# Patient Record
Sex: Female | Born: 1935 | Race: White | Hispanic: No | Smoking: Former smoker
Health system: Southern US, Community
[De-identification: ages and names within clinical notes are randomized; demographics above are authoritative.]

## PROBLEM LIST (undated history)

## (undated) DIAGNOSIS — R590 Localized enlarged lymph nodes: Secondary | ICD-10-CM

## (undated) DIAGNOSIS — C3490 Malignant neoplasm of unspecified part of unspecified bronchus or lung: Secondary | ICD-10-CM

## (undated) DIAGNOSIS — Z720 Tobacco use: Secondary | ICD-10-CM

## (undated) DIAGNOSIS — J45909 Unspecified asthma, uncomplicated: Secondary | ICD-10-CM

## (undated) DIAGNOSIS — Z8739 Personal history of other diseases of the musculoskeletal system and connective tissue: Secondary | ICD-10-CM

## (undated) DIAGNOSIS — M8580 Other specified disorders of bone density and structure, unspecified site: Secondary | ICD-10-CM

## (undated) DIAGNOSIS — I10 Essential (primary) hypertension: Secondary | ICD-10-CM

## (undated) DIAGNOSIS — R040 Epistaxis: Secondary | ICD-10-CM

## (undated) DIAGNOSIS — Z87898 Personal history of other specified conditions: Secondary | ICD-10-CM

## (undated) DIAGNOSIS — J449 Chronic obstructive pulmonary disease, unspecified: Secondary | ICD-10-CM

## (undated) DIAGNOSIS — T7840XA Allergy, unspecified, initial encounter: Secondary | ICD-10-CM

## (undated) DIAGNOSIS — C7931 Secondary malignant neoplasm of brain: Secondary | ICD-10-CM

## (undated) DIAGNOSIS — H409 Unspecified glaucoma: Secondary | ICD-10-CM

## (undated) DIAGNOSIS — C801 Malignant (primary) neoplasm, unspecified: Secondary | ICD-10-CM

## (undated) DIAGNOSIS — K449 Diaphragmatic hernia without obstruction or gangrene: Secondary | ICD-10-CM

## (undated) DIAGNOSIS — G459 Transient cerebral ischemic attack, unspecified: Secondary | ICD-10-CM

## (undated) DIAGNOSIS — R42 Dizziness and giddiness: Secondary | ICD-10-CM

## (undated) DIAGNOSIS — H8110 Benign paroxysmal vertigo, unspecified ear: Secondary | ICD-10-CM

## (undated) DIAGNOSIS — M109 Gout, unspecified: Secondary | ICD-10-CM

## (undated) DIAGNOSIS — M154 Erosive (osteo)arthritis: Secondary | ICD-10-CM

## (undated) DIAGNOSIS — K5790 Diverticulosis of intestine, part unspecified, without perforation or abscess without bleeding: Secondary | ICD-10-CM

## (undated) DIAGNOSIS — K649 Unspecified hemorrhoids: Secondary | ICD-10-CM

## (undated) DIAGNOSIS — J40 Bronchitis, not specified as acute or chronic: Secondary | ICD-10-CM

## (undated) DIAGNOSIS — Z923 Personal history of irradiation: Secondary | ICD-10-CM

## (undated) DIAGNOSIS — M47812 Spondylosis without myelopathy or radiculopathy, cervical region: Secondary | ICD-10-CM

## (undated) DIAGNOSIS — E785 Hyperlipidemia, unspecified: Secondary | ICD-10-CM

## (undated) DIAGNOSIS — F40240 Claustrophobia: Secondary | ICD-10-CM

## (undated) DIAGNOSIS — N281 Cyst of kidney, acquired: Secondary | ICD-10-CM

## (undated) DIAGNOSIS — IMO0001 Reserved for inherently not codable concepts without codable children: Secondary | ICD-10-CM

## (undated) HISTORY — DX: Other specified disorders of bone density and structure, unspecified site: M85.80

## (undated) HISTORY — DX: Hyperlipidemia, unspecified: E78.5

## (undated) HISTORY — PX: HEMORRHOID SURGERY: SHX153

## (undated) HISTORY — DX: Personal history of other diseases of the musculoskeletal system and connective tissue: Z87.39

## (undated) HISTORY — DX: Erosive (osteo)arthritis: M15.4

## (undated) HISTORY — DX: Benign paroxysmal vertigo, unspecified ear: H81.10

## (undated) HISTORY — DX: Unspecified hemorrhoids: K64.9

## (undated) HISTORY — PX: COLONOSCOPY: SHX174

## (undated) HISTORY — DX: Gout, unspecified: M10.9

## (undated) HISTORY — DX: Unspecified asthma, uncomplicated: J45.909

## (undated) HISTORY — PX: TONSILLECTOMY: SUR1361

## (undated) HISTORY — DX: Personal history of other specified conditions: Z87.898

## (undated) HISTORY — PX: ABDOMINAL HYSTERECTOMY: SHX81

## (undated) HISTORY — DX: Cyst of kidney, acquired: N28.1

## (undated) HISTORY — DX: Tobacco use: Z72.0

## (undated) HISTORY — DX: Spondylosis without myelopathy or radiculopathy, cervical region: M47.812

## (undated) HISTORY — PX: APPENDECTOMY: SHX54

## (undated) HISTORY — DX: Chronic obstructive pulmonary disease, unspecified: J44.9

## (undated) HISTORY — DX: Diverticulosis of intestine, part unspecified, without perforation or abscess without bleeding: K57.90

## (undated) HISTORY — DX: Essential (primary) hypertension: I10

## (undated) HISTORY — DX: Reserved for inherently not codable concepts without codable children: IMO0001

## (undated) HISTORY — PX: EYE SURGERY: SHX253

## (undated) HISTORY — DX: Diaphragmatic hernia without obstruction or gangrene: K44.9

## (undated) HISTORY — DX: Localized enlarged lymph nodes: R59.0

## (undated) HISTORY — DX: Unspecified glaucoma: H40.9

## (undated) HISTORY — PX: OTHER SURGICAL HISTORY: SHX169

---

## 1998-06-26 ENCOUNTER — Other Ambulatory Visit: Admission: RE | Admit: 1998-06-26 | Discharge: 1998-06-26 | Payer: Self-pay | Admitting: Internal Medicine

## 1999-05-17 ENCOUNTER — Encounter: Payer: Self-pay | Admitting: Emergency Medicine

## 1999-05-17 ENCOUNTER — Emergency Department (HOSPITAL_COMMUNITY): Admission: EM | Admit: 1999-05-17 | Discharge: 1999-05-17 | Payer: Self-pay | Admitting: Emergency Medicine

## 1999-05-20 ENCOUNTER — Encounter: Payer: Self-pay | Admitting: Neurology

## 1999-05-20 ENCOUNTER — Inpatient Hospital Stay (HOSPITAL_COMMUNITY): Admission: EM | Admit: 1999-05-20 | Discharge: 1999-05-22 | Payer: Self-pay | Admitting: *Deleted

## 1999-12-17 ENCOUNTER — Ambulatory Visit (HOSPITAL_COMMUNITY): Admission: RE | Admit: 1999-12-17 | Discharge: 1999-12-17 | Payer: Self-pay | Admitting: Neurology

## 1999-12-17 ENCOUNTER — Encounter: Payer: Self-pay | Admitting: Neurology

## 2000-02-03 ENCOUNTER — Ambulatory Visit (HOSPITAL_COMMUNITY): Admission: RE | Admit: 2000-02-03 | Discharge: 2000-02-03 | Payer: Self-pay | Admitting: Orthopedic Surgery

## 2000-02-25 ENCOUNTER — Ambulatory Visit (HOSPITAL_BASED_OUTPATIENT_CLINIC_OR_DEPARTMENT_OTHER): Admission: RE | Admit: 2000-02-25 | Discharge: 2000-02-25 | Payer: Self-pay | Admitting: Orthopedic Surgery

## 2000-03-02 ENCOUNTER — Ambulatory Visit (HOSPITAL_COMMUNITY): Admission: RE | Admit: 2000-03-02 | Discharge: 2000-03-02 | Payer: Self-pay | Admitting: Orthopedic Surgery

## 2000-04-08 ENCOUNTER — Encounter: Admission: RE | Admit: 2000-04-08 | Discharge: 2000-04-28 | Payer: Self-pay | Admitting: Orthopedic Surgery

## 2000-08-19 ENCOUNTER — Other Ambulatory Visit: Admission: RE | Admit: 2000-08-19 | Discharge: 2000-08-19 | Payer: Self-pay | Admitting: Internal Medicine

## 2001-05-13 ENCOUNTER — Ambulatory Visit (HOSPITAL_COMMUNITY): Admission: RE | Admit: 2001-05-13 | Discharge: 2001-05-13 | Payer: Self-pay | Admitting: *Deleted

## 2001-05-27 ENCOUNTER — Ambulatory Visit (HOSPITAL_COMMUNITY): Admission: RE | Admit: 2001-05-27 | Discharge: 2001-05-27 | Payer: Self-pay | Admitting: *Deleted

## 2006-09-28 ENCOUNTER — Ambulatory Visit (HOSPITAL_BASED_OUTPATIENT_CLINIC_OR_DEPARTMENT_OTHER): Admission: RE | Admit: 2006-09-28 | Discharge: 2006-09-28 | Payer: Self-pay | Admitting: Urology

## 2006-11-16 ENCOUNTER — Ambulatory Visit (HOSPITAL_BASED_OUTPATIENT_CLINIC_OR_DEPARTMENT_OTHER): Admission: RE | Admit: 2006-11-16 | Discharge: 2006-11-16 | Payer: Self-pay | Admitting: Urology

## 2008-07-19 ENCOUNTER — Encounter: Admission: RE | Admit: 2008-07-19 | Discharge: 2008-10-17 | Payer: Self-pay | Admitting: Internal Medicine

## 2008-11-07 ENCOUNTER — Encounter: Admission: RE | Admit: 2008-11-07 | Discharge: 2008-11-07 | Payer: Self-pay | Admitting: Family Medicine

## 2008-11-11 ENCOUNTER — Emergency Department (HOSPITAL_COMMUNITY): Admission: EM | Admit: 2008-11-11 | Discharge: 2008-11-12 | Payer: Self-pay | Admitting: Emergency Medicine

## 2008-11-12 ENCOUNTER — Emergency Department (HOSPITAL_COMMUNITY): Admission: EM | Admit: 2008-11-12 | Discharge: 2008-11-12 | Payer: Self-pay | Admitting: Emergency Medicine

## 2009-01-03 ENCOUNTER — Encounter (INDEPENDENT_AMBULATORY_CARE_PROVIDER_SITE_OTHER): Payer: Self-pay | Admitting: *Deleted

## 2009-09-10 ENCOUNTER — Telehealth: Payer: Self-pay | Admitting: Gastroenterology

## 2010-05-20 NOTE — Progress Notes (Signed)
Summary: Schedule Colonoscopy  Phone Note Outgoing Call   Call placed by: Lamona Curl CMA Duncan Dull),  Sep 10, 2009 4:44 PM Call placed to: Patient Summary of Call: Patient is due for recall colonoscopy. Her last colonoscopy in 2003 showed diverticulosis. Patient wishes to call back and schedule at a later time because she is "in the midst of getting a new primary care doctor and I would like to talk to them first." Patient encouraged to call back to schedule procedure as soon as possible. Initial call taken by: Lamona Curl CMA Duncan Dull),  Sep 10, 2009 4:47 PM

## 2010-06-30 ENCOUNTER — Other Ambulatory Visit: Payer: Self-pay | Admitting: Orthopedic Surgery

## 2010-06-30 ENCOUNTER — Encounter (HOSPITAL_COMMUNITY): Payer: Medicare Other

## 2010-06-30 LAB — PROTIME-INR
INR: 0.96 (ref 0.00–1.49)
Prothrombin Time: 13 seconds (ref 11.6–15.2)

## 2010-06-30 LAB — URINALYSIS, ROUTINE W REFLEX MICROSCOPIC
Bilirubin Urine: NEGATIVE
Glucose, UA: NEGATIVE mg/dL
Hgb urine dipstick: NEGATIVE
Ketones, ur: NEGATIVE mg/dL
Nitrite: NEGATIVE
Protein, ur: NEGATIVE mg/dL
Specific Gravity, Urine: 1.02 (ref 1.005–1.030)
Urobilinogen, UA: 0.2 mg/dL (ref 0.0–1.0)
pH: 5 (ref 5.0–8.0)

## 2010-06-30 LAB — CBC
HCT: 42.3 % (ref 36.0–46.0)
Hemoglobin: 13.9 g/dL (ref 12.0–15.0)
MCH: 31.2 pg (ref 26.0–34.0)
MCHC: 32.9 g/dL (ref 30.0–36.0)
MCV: 94.8 fL (ref 78.0–100.0)
Platelets: 255 10*3/uL (ref 150–400)
RBC: 4.46 MIL/uL (ref 3.87–5.11)
RDW: 12.9 % (ref 11.5–15.5)
WBC: 7.4 10*3/uL (ref 4.0–10.5)

## 2010-06-30 LAB — APTT: aPTT: 43 seconds — ABNORMAL HIGH (ref 24–37)

## 2010-06-30 LAB — COMPREHENSIVE METABOLIC PANEL
ALT: 18 U/L (ref 0–35)
AST: 26 U/L (ref 0–37)
Albumin: 3.9 g/dL (ref 3.5–5.2)
Alkaline Phosphatase: 77 U/L (ref 39–117)
BUN: 15 mg/dL (ref 6–23)
CO2: 27 mEq/L (ref 19–32)
Calcium: 9.4 mg/dL (ref 8.4–10.5)
Chloride: 104 mEq/L (ref 96–112)
Creatinine, Ser: 0.81 mg/dL (ref 0.4–1.2)
GFR calc Af Amer: >60 mL/min (ref >60)
GFR calc non Af Amer: >60 mL/min (ref >60)
Glucose, Bld: 97 mg/dL (ref 70–99)
Potassium: 4.3 mEq/L (ref 3.5–5.1)
Sodium: 139 mEq/L (ref 135–145)
Total Bilirubin: 0.7 mg/dL (ref 0.3–1.2)
Total Protein: 7.4 g/dL (ref 6.0–8.3)

## 2010-06-30 LAB — URINE MICROSCOPIC-ADD ON

## 2010-06-30 LAB — SURGICAL PCR SCREEN: MRSA, PCR: NEGATIVE

## 2010-07-07 ENCOUNTER — Inpatient Hospital Stay (HOSPITAL_COMMUNITY)
Admission: RE | Admit: 2010-07-07 | Discharge: 2010-07-10 | DRG: 470 | Disposition: A | Discharge status: Skilled Nursing Facility | Payer: Medicare Other | Source: Ambulatory Visit | Attending: Orthopedic Surgery | Admitting: Orthopedic Surgery

## 2010-07-07 DIAGNOSIS — R32 Unspecified urinary incontinence: Secondary | ICD-10-CM | POA: Diagnosis present

## 2010-07-07 DIAGNOSIS — E785 Hyperlipidemia, unspecified: Secondary | ICD-10-CM | POA: Diagnosis present

## 2010-07-07 DIAGNOSIS — Z01818 Encounter for other preprocedural examination: Secondary | ICD-10-CM

## 2010-07-07 DIAGNOSIS — Z8673 Personal history of transient ischemic attack (TIA), and cerebral infarction without residual deficits: Secondary | ICD-10-CM

## 2010-07-07 DIAGNOSIS — M1710 Unilateral primary osteoarthritis, unspecified knee: Principal | ICD-10-CM | POA: Diagnosis present

## 2010-07-07 DIAGNOSIS — I10 Essential (primary) hypertension: Secondary | ICD-10-CM | POA: Diagnosis present

## 2010-07-07 DIAGNOSIS — D62 Acute posthemorrhagic anemia: Secondary | ICD-10-CM | POA: Diagnosis not present

## 2010-07-07 DIAGNOSIS — K449 Diaphragmatic hernia without obstruction or gangrene: Secondary | ICD-10-CM | POA: Diagnosis present

## 2010-07-07 DIAGNOSIS — M899 Disorder of bone, unspecified: Secondary | ICD-10-CM | POA: Diagnosis present

## 2010-07-07 DIAGNOSIS — Z01812 Encounter for preprocedural laboratory examination: Secondary | ICD-10-CM

## 2010-07-07 LAB — TYPE AND SCREEN
ABO/RH(D): O NEG
Antibody Screen: NEGATIVE

## 2010-07-07 LAB — ABO/RH: ABO/RH(D): O NEG

## 2010-07-08 LAB — CBC
Hemoglobin: 11.8 g/dL — ABNORMAL LOW (ref 12.0–15.0)
MCH: 30.3 pg (ref 26.0–34.0)
MCHC: 31.5 g/dL (ref 30.0–36.0)
Platelets: 207 10*3/uL (ref 150–400)
RDW: 13.2 % (ref 11.5–15.5)

## 2010-07-08 LAB — BASIC METABOLIC PANEL
Calcium: 8.5 mg/dL (ref 8.4–10.5)
Creatinine, Ser: 0.95 mg/dL (ref 0.4–1.2)
GFR calc Af Amer: >60 mL/min (ref >60)
GFR calc non Af Amer: 57 mL/min — ABNORMAL LOW (ref >60)
Sodium: 139 mEq/L (ref 135–145)

## 2010-07-09 LAB — BASIC METABOLIC PANEL
Calcium: 8.3 mg/dL — ABNORMAL LOW (ref 8.4–10.5)
GFR calc Af Amer: >60 mL/min (ref >60)
GFR calc non Af Amer: >60 mL/min (ref >60)
Sodium: 138 mEq/L (ref 135–145)

## 2010-07-09 LAB — CBC
Hemoglobin: 10.1 g/dL — ABNORMAL LOW (ref 12.0–15.0)
Platelets: 172 10*3/uL (ref 150–400)
RBC: 3.33 MIL/uL — ABNORMAL LOW (ref 3.87–5.11)

## 2010-07-09 NOTE — Op Note (Signed)
[name], [name]                  ACCOUNT NO.:  [id number]  MEDICAL RECORD NO.:  [id number]           PATIENT TYPE:  I  LOCATION:  0009                         FACILITY:  [facility]  PHYSICIAN:  Ollen Gross, M.D.    DATE OF BIRTH:  Jul 18, 1928  DATE OF PROCEDURE:  07/07/2010 DATE OF DISCHARGE:                              OPERATIVE REPORT   PREOPERATIVE DIAGNOSIS:  Osteoarthritis, left knee.  POSTOPERATIVE DIAGNOSIS:  Osteoarthritis, left knee.  PROCEDURE:  Left total knee arthroplasty.  SURGEON:  Ollen Gross, MD  ASSISTANT:  Rozell Searing, PA-C  ANESTHESIA:  Spinal.  ESTIMATED BLOOD LOSS:  Minimal.  DRAIN:  Hemovac x1.  TOURNIQUET TIME:  34 minutes at 300 mmHg.  COMPLICATIONS:  None.  CONDITION:  Stable to Recovery.  BRIEF CLINICAL NOTE:  [name] is a 77 year old female with advanced end-stage arthritis of the left knee with progressively worsening pain and dysfunction.  She has failed nonoperative management and presents for total knee arthroplasty.  PROCEDURE IN DETAIL:  After successful initiation of spinal anesthetic, a tourniquet was placed high on the left thigh, and left lower extremity prepped and draped in the usual sterile fashion.  Extremity was wrapped in an Esmarch, knee flexed, tourniquet inflated to 300 mmHg.  Midline incision was made with a 10 blade through subcutaneous tissue at the level of the extensor mechanism.  Fresh blade was used to make a medial parapatellar arthrotomy.  Soft tissue on the proximal medial tibia subperiosteally elevated to the joint line with a knife and into the semimembranosus bursa with a Cobb elevator.  Soft tissue laterally was elevated with attention being paid to avoid any patellar tendon on tibial tubercle.  Patella was everted, knee flexed 90 degrees, and ACL and PCL removed.  Drill was used to create a starting hole in the distal femur and the canal was thoroughly irrigated.  The 5-degree left valgus alignment  guide was placed and the distal femoral cutting block was pinned to remove 11 mm off the distal femur.  Distal femoral resection was made with an oscillating saw.  Tibia subluxed forward and the menisci were removed.  The extramedullary tibial alignment guide was placed referencing proximally at the medial aspect of the tibial tubercle and distally along the 2nd metatarsal axis and tibial crest.  The block was pinned to remove 2 mm off the more deficient medial side.  Tibial resection was made with an oscillating saw.  Size 2.5 was the most appropriate tibial component and the proximal tibia was prepared with a modular drill, and keel punched for the size 2.5.  Femoral sizing guide was placed, size 3 was most appropriate.  Rotation was marked at the epicondylar axis and confirmed by creating a rectangular flexion gap at 90 degrees.  Size 3 cutting block was placed in this rotation and the anterior and posterior chamfer cuts were made. Intercondylar block was placed and that cut was made.  Trial size 3 posterior stabilized femur was placed.  A 12.5-mm posterior stabilized rotating platform insert trial was placed.  With the 12.5, full extension was achieved with excellent varus-valgus and  anterior- posterior balance, throughout full range of motion.  The patella was then everted and thickness measured to be 22 mm.  Freehand resection was taken to 13 mm, 35 template was placed, lug holes were drilled, trial patella was placed and it tracked normally.  Osteophytes were removed off the posterior femur with the trial in place.  All trials were removed and the cut bone surfaces were prepared with pulsatile lavage. Cement was mixed and once ready for implantation, the size 2.5 mobile- bearing tibial tray, size 3 posterior stabilized femur, and the 35 patella were cemented into place and patella was held with a clamp. Trial 12.5-mm insert was placed, knee held in full extension, all extruded  cement removed.  When the cement was fully hardened, then the permanent 12.5 mm posterior stabilized rotating platform insert was placed in the tibial tray.  The wound was copiously irrigated with saline solution and the arthrotomy closed over Hemovac drain with interrupted #1 PDS.  Flexion against gravity was about 135 degrees and patella tracked normally.  The tourniquet was then released at total time of 34 minutes.  Subcutaneous was closed with interrupted 2-0 Vicryl and subcuticular with running 4-0 Monocryl.  Catheter for the Marcaine pain pump was placed and the pump was initiated.  Incision was cleaned and dried and Steri-Strips and a bulky sterile dressing were applied. She was then placed into a knee immobilizer, awakened, and transported to Recovery in stable condition.     Ollen Gross, M.D.     FA/MEDQ  D:  07/07/2010  T:  07/07/2010  Job:  [id number]  Electronically Signed by Ollen Gross M.D. on 07/09/2010 08:20:34 AM

## 2010-07-10 LAB — CBC
Hemoglobin: 9.3 g/dL — ABNORMAL LOW (ref 12.0–15.0)
RBC: 3.01 MIL/uL — ABNORMAL LOW (ref 3.87–5.11)
WBC: 10.1 10*3/uL (ref 4.0–10.5)

## 2010-07-14 NOTE — Discharge Summary (Signed)
NAMEJENNI, [name]                  ACCOUNT NO.:  [id number]  MEDICAL RECORD NO.:  [id number]           PATIENT TYPE:  I  LOCATION:  1608                         FACILITY:  [facility]  PHYSICIAN:  Ollen Gross, M.D.    DATE OF BIRTH:  1928-04-29  DATE OF ADMISSION:  07/07/2010 DATE OF DISCHARGE:  07/10/2010                        DISCHARGE SUMMARY - REFERRING   ADMITTING DIAGNOSES: 1. Osteoarthritis, left knee. 2. Questionable transient ischemic attack in 2001. 3. Vertigo. 4. Cataracts. 5. Glaucoma. 6. Childhood asthma. 7. Hypertension. 8. Hyperlipidemia. 9. Aortic sclerosis. 10.History of gastrointestinal ulcers. 11.Hiatal hernia. 12.Hemorrhoids. 13.History of diverticulosis. 14.Urinary incontinence. 15.Osteopenia.  DISCHARGE DIAGNOSES: 1. Osteoarthritis, left knee, status post left total knee replacement     arthroplasty. 2. Postoperative acute blood loss anemia, did not require transfusion. 3. Questionable transient ischemic attack in 2001. 4. Vertigo. 5. Cataracts. 6. Glaucoma. 7. Childhood asthma. 8. Hypertension. 9. Hyperlipidemia. 10.Aortic sclerosis. 11.History of gastrointestinal ulcers. 12.Hiatal hernia. 13.Hemorrhoids. 14.History of diverticulosis. 15.Urinary incontinence. 16.Osteopenia.  PROCEDURE:  July 07, 2010, left total knee.  SURGEON:  Ollen Gross, MD  ASSISTANT:  [name], [facility]  ANESTHESIA:  Spinal anesthesia.  TOURNIQUET TIME:  34 minutes.  CONSULTS:  None.  BRIEF HISTORY:  The patient is a 77 year old female with osteoarthritis of the left knee with progressive worsening pain and dysfunction.  She has failed nonoperative management and now presents for total knee arthroplasty.  LABORATORY DATA:  CBC on admission showed hemoglobin 13.9, hematocrit of 42.3, white cell count 7.4, platelets of 255,000.  PT/INR 13.0 and 0.96 with a PTT of 43.  Chem panel on admission all within normal limits. Preop UA, small leukocytes, few  squamous, 3 to 6 white cells, few bacteria.  Blood group type, O negative.  Nasal swabs were negative. Staph aureus negative for MRSA.  Serial CBCs were followed.  Hemoglobin dropped down to 11.8 and 10.1, last noted H and H was 9.3 and 28.9.  At time of discharge, white count remained within normal limits.  Serial BMETs were followed for 48 hours.  Electrolytes remained within normal limits.  Glucose did go up from 97 to 142, back down to 133 noted on postop day #2.  HOSPITAL COURSE:  The patient was admitted to [facility], taken to OR, underwent above-stated procedure without complication.  The patient tolerated procedure well, later transferred to the recovery room and then to orthopedic floor for postoperative care.  She was given 24 hours postop IV antibiotics.  She is placed on p.o. and IV analgesics pain control following surgery.  She was given Xarelto protocol for DVT prophylaxis.  She was allowed to be weightbearing as tolerated.  She did want to look into skilled nursing facility postoperatively due to hersituation and we got social work involved immediately.  She is doing pretty well in the morning of day 1 although had fair amount of pain. We encouraged taking p.o.'s with IV backup.  By day 2, pain was doing a little bit better.  She was starting get up and walk about 20 feet, slowly progressing, felt to be an excellent candidate for rehab.  Once  she was taking p.o.'s well, we discontinued the fluids and Hep-Lock the IV.  She was seen on postop day #3.  Pain was under much better control, slowly progressing with physical therapy, and it was thought the patient will be transferred over at that time.  DISCHARGE PLAN:  The patient discharged to [facility] on July 10, 2010.  DISCHARGE MEDICATIONS: 1. Xarelto 10 mg p.o. daily for 14 days, then discontinue. 2. Colace 100 mg p.o. b.i.d. 3. Bystolic 5 mg p.o. q.h.s. 4. Robaxin 500 mg p.o. q.6-8h. p.r.n. spasm. 5.  Zofran 4 mg p.o. q.6h. p.r.n. nausea. 6. Percocet 5 mg 1 or 2 every 4-6 hours as needed for moderate pain. 7. Tylenol 325, 1 or 2 every 4-6 hours as needed for mild pain,     temperature, or headache. 8. Timolol ophthalmic solution, 0.5%, left eye, 1 drop twice a day. 9. Laxative of choice. 10.Enema of choice.  Please note, the patient was on aspirin 81 mg daily preoperatively. Please resume her aspirin 81 mg once she has stopped the Xarelto.  She will be on the Xarelto for 14 days, so on postop day 15 if she is still at the nursing center, please resume aspirin 81 mg daily.  This is to start on postop day 51 once the Xarelto protocol was completed with 14 days.  FOLLOWUP:  The patient is to follow up with Dr. Lequita Halt in the office 2 weeks from the date of surgery.  Please contact the office at [phone] to arrange appointment and followup care.  ACTIVITY:  Weightbearing as tolerated, total knee protocol.  PT and OT for gait training, ambulation, ADLs, range of motion, and strengthening exercises.  DISPOSITION:  [facility]  CONDITION ON DISCHARGE:  Improving.     [name], P.A.C.   ______________________________ Ollen Gross, M.D.    ALP/MEDQ  D:  07/10/2010  T:  07/10/2010  Job:  [id number]  cc:   [name], M.D. Fax: [phone]  Skilled Nursing Facility of Choice  Electronically Signed by [name] P.A.C. on 07/11/2010 10:31:10 AM Electronically Signed by Ollen Gross M.D. on 07/14/2010 07:11:19 AM

## 2010-07-14 NOTE — H&P (Signed)
NAMEJAVONNA, [name]                  ACCOUNT NO.:  [id number]  MEDICAL RECORD NO.:  [id number]           PATIENT TYPE:  LOCATION:                                 FACILITY:  PHYSICIAN:  Ollen Gross, M.D.    DATE OF BIRTH:  [birth year]  DATE OF ADMISSION:  07/07/2010 DATE OF DISCHARGE:                             HISTORY & PHYSICAL   CHIEF COMPLAINT:  Left knee pain.  HISTORY OF PRESENT ILLNESS:  The patient is a 77 year old female who has been seen by Dr. Lequita Halt for ongoing advanced end-stage arthritis which has been refractory to conservative management.  It was felt that she would benefit from undergoing surgical intervention.  Risks and benefits have been discussed.  She would like to proceed with surgery.  She has been seen preoperatively by Dr. Kevan Ny and felt to be stable for procedure.  ALLERGIES: 1. WELLBUTRIN. 2. STATINS. 3. ADHESIVE TAPE.  Please note the patient is able to use paper tape.  FOOD ALLERGIES TO SHELLFISH.  Please note she does not have a Betadine or iodine allergy.  She is able to use Betadine.  CURRENT MEDICATIONS:  Timolol, Premarin, diltiazem, B complex, meloxicam, fish oil, Centrum Silver, aspirin.  PAST MEDICAL HISTORY: 1. Questionable TIA in 2001. 2. Vertigo. 3. Cataracts. 4. Glaucoma. 5. Childhood asthma. 6. Hypertension. 7. Hyperlipidemia. 8. Aortic sclerosis. 9. History of gastrointestinal ulcers. 10.Hiatal hernia. 11.Hemorrhoids. 12.History of diverticulosis. 13.History of urinary incontinence. 14.Osteopenia.  PAST SURGICAL HISTORY:  Appendectomy, hemorrhoidectomy, hysterectomy, cataract surgery x2, bladder procedure and colonoscopy.  FAMILY HISTORY:  Father with heart attack.  Mother with cirrhosis.  SOCIAL HISTORY:  Widowed, retired.  Past smoker, approximately 30 years. Two children.  She would like to look into rehab facility.  Postop, she does have a living will.  REVIEW OF SYSTEMS:  GENERAL:  No fevers, chills, night  sweats. NEUROLOGIC:  Little bit of blurred vision.  No seizures, syncope or paralysis.  RESPIRATORY:  No shortness breath, productive cough or hemoptysis.  CARDIOVASCULAR:  No chest pain.  No orthopnea.  GI:  No nausea, vomiting or diarrhea.  GU:  Little bit of incontinence.  No dysuria, hematuria.  MUSCULOSKELETAL:  Knee pain.  PHYSICAL EXAMINATION:  VITAL SIGNS:  Pulse 68, respirations 12, blood pressure 180/88 in the right arm, 176/87 in the left arm. GENERAL:  77 year old white female, well-nourished, well-developed, no acute distress, short-statured, alert, oriented and cooperative. HEENT:  Normocephalic, atraumatic.  Pupils are round and reactive.  EOMs intact.  Does not wear glasses.  Does have upper partial denture plate. NECK:  Supple. CHEST:  Clear. HEART:  Regular rate and rhythm without murmur, S1 and S2. ABDOMEN:  Soft, nontender.  Bowel sounds present. RECTAL, BREASTS AND GENITALIA:  Not done, not part of present illness. EXTREMITIES:  Left knee, no effusion, marked crepitus or varus deformity.  Range of motion is 5 to 110.  No instability.  IMPRESSION:  Osteoarthritis, left knee.  PLAN:  The patient was admitted to [facility] to undergo a left total knee replacement arthroplasty.  Surgery will be performed by Dr. Ollen Gross.  [name], P.A.C.   ______________________________ Ollen Gross, M.D.    ALP/MEDQ  D:  07/05/2010  T:  07/05/2010  Job:  [id number]  cc:   Candyce Churn, M.D. Fax: [phone]  Electronically Signed by Patrica Duel P.A.C. on 07/11/2010 10:31:02 AM Electronically Signed by Ollen Gross M.D. on 07/14/2010 07:11:16 AM

## 2010-07-27 LAB — DIFFERENTIAL
Eosinophils Relative: 3 % (ref 0–5)
Lymphocytes Relative: 29 % (ref 12–46)
Lymphs Abs: 1.5 10*3/uL (ref 0.7–4.0)
Monocytes Absolute: 0.5 10*3/uL (ref 0.1–1.0)
Neutro Abs: 3.1 10*3/uL (ref 1.7–7.7)

## 2010-07-27 LAB — CBC
HCT: 38.5 % (ref 36.0–46.0)
Hemoglobin: 12.6 g/dL (ref 12.0–15.0)
Platelets: 241 10*3/uL (ref 150–400)
WBC: 5.2 10*3/uL (ref 4.0–10.5)

## 2010-07-27 LAB — PROTIME-INR
INR: 0.9 (ref 0.00–1.49)
Prothrombin Time: 12.5 seconds (ref 11.6–15.2)
Prothrombin Time: 13.4 seconds (ref 11.6–15.2)

## 2010-07-27 LAB — POCT I-STAT, CHEM 8
Chloride: 105 mEq/L (ref 96–112)
HCT: 38 % (ref 36.0–46.0)
Hemoglobin: 12.9 g/dL (ref 12.0–15.0)
Potassium: 4 mEq/L (ref 3.5–5.1)

## 2010-07-27 LAB — APTT: aPTT: 36 seconds (ref 24–37)

## 2010-09-02 NOTE — Op Note (Signed)
NAMEMECCA, [name]                  ACCOUNT NO.:  [id number]   MEDICAL RECORD NO.:  [id number]          PATIENT TYPE:  AMB   LOCATION:  [facility]                         FACILITY:  [facility]   PHYSICIAN:  Martina Sinner, MD DATE OF BIRTH:  1928-10-18   DATE OF PROCEDURE:  09/28/2006  DATE OF DISCHARGE:                               OPERATIVE REPORT   PREOPERATIVE DIAGNOSIS:  Stress incontinence.   POSTOPERATIVE DIAGNOSIS:  Stress incontinence.   PROCEDURE:  Cystoscopy, transurethral collagen injection therapy.   [name] has complicated incontinence.  She leaks without warning.  She  has failed medical therapy.   The patient was prepped and draped in usual fashion.  I used the ACMI  scope for the examination.  The bladder mucosa and trigone were normal.  There was no stitch, foreign body, or carcinoma.  I injected 3 syringes  of collagen at 5, 7, and 1 o'clock.  The first injection at 5 o'clock  was a little bit deep, and then there was little bit of extravasation  trying to inject at 3 o'clock.  I then injected at 7 and 1 o'clock, and  there was excellent coaptation.  A total of 3 syringes of collagen were  used.  I will carefully monitor her success of this therapy, recognizing  the complicated nature of her incontinence.           ______________________________  Martina Sinner, MD  Electronically Signed     SAM/MEDQ  D:  09/28/2006  T:  09/28/2006  Job:  [id number]

## 2010-09-02 NOTE — Op Note (Signed)
[name], EWALD                  ACCOUNT NO.:  [id number]   MEDICAL RECORD NO.:  [id number]          PATIENT TYPE:  AMB   LOCATION:  NESC                         FACILITY:  [facility]   PHYSICIAN:  Martina Sinner, MD DATE OF BIRTH:  05-Oct-1928   DATE OF PROCEDURE:  DATE OF DISCHARGE:                               OPERATIVE REPORT   PREOPERATIVE DIAGNOSES:  Stress incontinence.   POSTOPERATIVE DIAGNOSES:  Stress incontinence.   SURGERY:  Cystoscopy, transurethral collagen injection therapy.   [name] has stress incontinence and leakage without warning.  I have  been trying to sort out whether or not her symptoms are due to bladder  overactivity that she is not feeling or due to an outlet abnormality.  She had transient benefit from one collagen treatment.  She was here for  another injection.   The patient was prepped and draped in the usual fashion.  I used the  ACMI collagen scope for the examination.  Bladder mucosa and trigone  were normal.  I could visualize some residual collagen.   I injected four syringes of collagen at 5, 7 and 6 o'clock.  I was  actually very happy with the coaptation.   The bladder was emptied with a red small red rubber catheter.   [name] will be called and seen in a week in clinic.  If she does not  respond to collage, I am hopefully going to be able to recommend PTNS.           ______________________________  Martina Sinner, MD  Electronically Signed     SAM/MEDQ  D:  11/16/2006  T:  11/16/2006  Job:  [id number]

## 2011-02-02 LAB — POCT HEMOGLOBIN-HEMACUE
Hemoglobin: 13.3
Operator id: 268271

## 2011-11-17 ENCOUNTER — Other Ambulatory Visit: Payer: Self-pay | Admitting: Internal Medicine

## 2011-11-17 ENCOUNTER — Ambulatory Visit
Admission: RE | Admit: 2011-11-17 | Discharge: 2011-11-17 | Disposition: A | Discharge status: Home / Self Care | Payer: Medicare Other | Source: Ambulatory Visit | Attending: Internal Medicine | Admitting: Internal Medicine

## 2011-11-17 DIAGNOSIS — R590 Localized enlarged lymph nodes: Secondary | ICD-10-CM

## 2011-11-17 MED ORDER — IOHEXOL 300 MG/ML  SOLN
75.0000 mL | Freq: Once | INTRAMUSCULAR | Status: AC | PRN
  Administered 2011-11-17: 75 mL via INTRAVENOUS

## 2011-11-19 DIAGNOSIS — C801 Malignant (primary) neoplasm, unspecified: Secondary | ICD-10-CM

## 2011-11-19 HISTORY — DX: Malignant (primary) neoplasm, unspecified: C80.1

## 2011-11-23 DIAGNOSIS — R590 Localized enlarged lymph nodes: Secondary | ICD-10-CM | POA: Insufficient documentation

## 2011-11-24 ENCOUNTER — Institutional Professional Consult (permissible substitution) (INDEPENDENT_AMBULATORY_CARE_PROVIDER_SITE_OTHER): Payer: Medicare Other | Admitting: Thoracic Surgery (Cardiothoracic Vascular Surgery)

## 2011-11-24 ENCOUNTER — Other Ambulatory Visit: Payer: Self-pay | Admitting: Thoracic Surgery (Cardiothoracic Vascular Surgery)

## 2011-11-24 ENCOUNTER — Encounter (HOSPITAL_COMMUNITY): Payer: Self-pay | Admitting: Pharmacy Technician

## 2011-11-24 ENCOUNTER — Other Ambulatory Visit: Payer: Self-pay

## 2011-11-24 ENCOUNTER — Encounter: Payer: Self-pay | Admitting: Thoracic Surgery (Cardiothoracic Vascular Surgery)

## 2011-11-24 VITALS — BP 188/92 | HR 72 | Resp 18 | Ht 61.0 in | Wt 162.0 lb

## 2011-11-24 DIAGNOSIS — R42 Dizziness and giddiness: Secondary | ICD-10-CM

## 2011-11-24 DIAGNOSIS — D381 Neoplasm of uncertain behavior of trachea, bronchus and lung: Secondary | ICD-10-CM

## 2011-11-24 DIAGNOSIS — R222 Localized swelling, mass and lump, trunk: Secondary | ICD-10-CM

## 2011-11-24 DIAGNOSIS — R590 Localized enlarged lymph nodes: Secondary | ICD-10-CM

## 2011-11-24 DIAGNOSIS — R599 Enlarged lymph nodes, unspecified: Secondary | ICD-10-CM

## 2011-11-24 DIAGNOSIS — R918 Other nonspecific abnormal finding of lung field: Secondary | ICD-10-CM

## 2011-11-24 DIAGNOSIS — R51 Headache: Secondary | ICD-10-CM

## 2011-11-24 NOTE — Progress Notes (Signed)
PCP is [name], MD Referring Provider is Georgann Housekeeper, MD  Chief Complaint  Patient presents with  . Lung Mass    Referral from Dr Donette Larry Dr Kevan Ny for eval on Rt lung mass, mediastinal adenopathy, Chest CT 11/17/2011   . Adenopathy    HPI: 78 year old woman presents with chief complaint of enlarged lymph nodes.  [name] is an 78 year old woman who recently was vacationing in [place]. She developed some left-sided neck pain and went to the hospital. A CT scan of the neck was done and there was signs of mediastinal adenopathy noted. Her pain was treated with Vicodin and has improved. When she returned to [facility] she saw Dr. Donette Larry in had a CT of the chest done which showed a right middle lobe mass, bulky hilar or mediastinal adenopathy, and a 1.5 cm right adrenal mass. She denies cough, hemoptysis, shortness of breath or chest pain. She has been having headaches for the last couple of weeks. And over the past week has noted several dizzy spells, which come on spontaneously and last about 5 minutes. She has not had any weight loss. She denies fevers, chills or sweats.   Past Medical History  Diagnosis Date  . Cervical spondylosis   . Renal cyst     simple bilateral 2008  . Diverticulosis   . Vertigo, benign positional   . Hyperlipidemia   . Aortic sclerosis     by exam,  . Erosive osteoarthritis of hand   . History of calcium pyrophosphate deposition disease (CPPD)   . Gout     vs pseudo gout flares in past  . Mediastinal adenopathy   . Tobacco use   . COPD, mild   . Glaucoma   . Hypertension   . Osteopenia   . Hiatal hernia   . Hemorrhoids   . H/O urinary incontinence   . Childhood asthma     Past Surgical History  Procedure Date  . Cystoscopy, transurethral collagen injection therapy   . Left total knee arthroplasty   . Appendectomy   . Hemorrhoid surgery   . Cataract surgery x 2     Family History  Problem Relation Age of Onset  . Heart  disease Father   . Cirrhosis Mother     Social History History  Substance Use Topics  . Smoking status: Former Smoker -- 2.0 packs/day for 35 years    Types: Cigarettes    Quit date: 04/21/2003  . Smokeless tobacco: Never Used  . Alcohol Use: Yes     rare wine    Current Outpatient Prescriptions  Medication Sig Dispense Refill  . ascorbic acid (VITAMIN C) 500 MG tablet Take 500 mg by mouth daily.      Marland Kitchen aspirin 81 MG tablet Take 81 mg by mouth daily.      . Azelastine-Fluticasone (DYMISTA) 137-50 MCG/ACT SUSP Place into the nose daily as needed.      . Calcium-Vitamin D (CALTRATE 600 PLUS-VIT D PO) Take by mouth daily.      . colchicine 0.6 MG tablet Take 0.6 mg by mouth daily.      . diclofenac sodium (VOLTAREN) 1 % GEL Apply 1 application topically every 6 (six) hours.      Marland Kitchen estrogens, conjugated, (PREMARIN) 0.625 MG tablet Take 0.625 mg by mouth daily. Take daily for 21 days then do not take for 7 days.      . fish oil-omega-3 fatty acids 1000 MG capsule Take 2 g by mouth daily.      Marland Kitchen  hydrocortisone (ANUSOL-HC) 25 MG suppository Place 25 mg rectally 2 (two) times daily as needed.      . loteprednol (LOTEMAX) 0.2 % SUSP 1 drop 4 (four) times daily.      . Multiple Vitamin (MULTIVITAMIN) tablet Take 1 tablet by mouth daily.      . nebivolol (BYSTOLIC) 5 MG tablet Take 5 mg by mouth daily.      . timolol (TIMOPTIC-XR) 0.5 % ophthalmic gel-forming Apply 1 drop to eye daily.        Allergies  Allergen Reactions  . Shellfish Allergy Anaphylaxis and Hives    NO betadine or iodine allergy  . Septra Ds (Sulfamethoxazole-Tmp Ds) Nausea And Vomiting  . Statins Other (See Comments)    Severe joint pains  . Tape     Adhesive only. (Paper tape OK)  . Wellbutrin (Bupropion) Other (See Comments)    Shaking all over     Review of Systems  Gastrointestinal:       Ulcers  Musculoskeletal: Positive for joint swelling and arthralgias.       Left TKR, right hip pain  Neurological:  Positive for dizziness and headaches.       "ministroke"  Psychiatric/Behavioral:       Anxiety  All other systems reviewed and are negative.    BP 188/92  Pulse 72  Resp 18  Ht 5\' 1"  (1.549 m)  Wt 162 lb (73.483 kg)  BMI 30.61 kg/m2  SpO2 97% Physical Exam  Constitutional: She is oriented to person, place, and time. She appears well-developed and well-nourished. No distress.  HENT:  Head: Normocephalic and atraumatic.  Eyes: EOM are normal. Pupils are equal, round, and reactive to light.  Neck: Neck supple. No thyromegaly present.  Cardiovascular: Normal rate, regular rhythm and normal heart sounds.   No murmur heard. Pulmonary/Chest: Breath sounds normal. She has no wheezes. She has no rales.  Abdominal: Soft. There is no tenderness.  Musculoskeletal: She exhibits no edema.  Lymphadenopathy:    She has no cervical adenopathy.  Neurological: She is alert and oriented to person, place, and time. No cranial nerve deficit.  Skin: Skin is warm and dry.  Psychiatric:       anxious     Diagnostic Tests: CT chest 11/17/11  IMPRESSION:  There is a lobulated lesion or adjacent lesions within the  posterior right middle lobe. Maximum size of this lesion measures  up to 4.1 cm. There is extensive mediastinal and right hilar  lymphadenopathy concerning for metastatic disease. These findings  are suspicious for primary bronchogenic carcinoma. The lesion and  lymphadenopathy can be further evaluated with a PET-CT.  Indeterminate 1.2 cm nodule in the right adrenal gland.  Differential diagnosis would include an adrenal adenoma versus a  small metastatic lesion.    Impression: 78 year old woman with a 70-pack-year history of tobacco abuse who quit in 2005. She now presents with a right middle lobe mass and bulky hilar or mediastinal adenopathy as well as a questionable adrenal lesion. This almost certainly represents lung cancer, and could be either small cell or non-small cell.  She needs biopsy for diagnostic purposes and to guide treatment.  I recommended to her that we proceed with bronchoscopy and endobronchial ultrasound and possible mediastinoscopy. I strongly suspect that the ultrasound guided biopsies will be positive and mediastinoscopy is unlikely be required I described the procedure to her and her family. They understand the need for general anesthesia. They understand the indications, risks, benefits, and alternatives. They understand  the risks include but are not limited to those associated with general anesthesia, MI, DVT, bleeding, vocal cord paralysis and pneumothorax. They do understand this procedure generally done outpatient basis and she should be able to go home the afternoon of the procedure.  She needs a PET/CT for staging purposes. She also needs a brain MR (or CT if she cannot tolerate MR) given her headaches and dizziness  Plan: Bronchoscopy, EBUS, possible mediastinoscopy PET/CT MRI brain (or CT as a backup plan)

## 2011-11-26 ENCOUNTER — Encounter (HOSPITAL_COMMUNITY): Payer: Self-pay

## 2011-11-26 ENCOUNTER — Ambulatory Visit (HOSPITAL_COMMUNITY)
Admission: RE | Admit: 2011-11-26 | Discharge: 2011-11-26 | Disposition: A | Discharge status: Home / Self Care | Payer: Medicare Other | Source: Ambulatory Visit | Attending: Thoracic Surgery (Cardiothoracic Vascular Surgery) | Admitting: Thoracic Surgery (Cardiothoracic Vascular Surgery)

## 2011-11-26 ENCOUNTER — Encounter (HOSPITAL_COMMUNITY)
Admission: RE | Admit: 2011-11-26 | Discharge: 2011-11-26 | Disposition: A | Discharge status: Home / Self Care | Payer: Medicare Other | Source: Ambulatory Visit | Attending: Thoracic Surgery (Cardiothoracic Vascular Surgery) | Admitting: Thoracic Surgery (Cardiothoracic Vascular Surgery)

## 2011-11-26 VITALS — BP 156/82 | HR 76 | Temp 97.9°F | Resp 20 | Ht 61.0 in | Wt 159.7 lb

## 2011-11-26 DIAGNOSIS — C770 Secondary and unspecified malignant neoplasm of lymph nodes of head, face and neck: Secondary | ICD-10-CM | POA: Insufficient documentation

## 2011-11-26 DIAGNOSIS — D381 Neoplasm of uncertain behavior of trachea, bronchus and lung: Secondary | ICD-10-CM

## 2011-11-26 DIAGNOSIS — R222 Localized swelling, mass and lump, trunk: Secondary | ICD-10-CM | POA: Insufficient documentation

## 2011-11-26 DIAGNOSIS — C771 Secondary and unspecified malignant neoplasm of intrathoracic lymph nodes: Secondary | ICD-10-CM | POA: Insufficient documentation

## 2011-11-26 DIAGNOSIS — C7970 Secondary malignant neoplasm of unspecified adrenal gland: Secondary | ICD-10-CM | POA: Insufficient documentation

## 2011-11-26 DIAGNOSIS — R599 Enlarged lymph nodes, unspecified: Secondary | ICD-10-CM | POA: Insufficient documentation

## 2011-11-26 DIAGNOSIS — C801 Malignant (primary) neoplasm, unspecified: Secondary | ICD-10-CM | POA: Insufficient documentation

## 2011-11-26 HISTORY — DX: Transient cerebral ischemic attack, unspecified: G45.9

## 2011-11-26 HISTORY — DX: Dizziness and giddiness: R42

## 2011-11-26 HISTORY — DX: Claustrophobia: F40.240

## 2011-11-26 HISTORY — DX: Bronchitis, not specified as acute or chronic: J40

## 2011-11-26 LAB — CBC
HCT: 42 % (ref 36.0–46.0)
Hemoglobin: 14.2 g/dL (ref 12.0–15.0)
MCV: 96.3 fL (ref 78.0–100.0)
RBC: 4.36 MIL/uL (ref 3.87–5.11)
WBC: 7.6 10*3/uL (ref 4.0–10.5)

## 2011-11-26 LAB — COMPREHENSIVE METABOLIC PANEL
BUN: 18 mg/dL (ref 6–23)
CO2: 24 mEq/L (ref 19–32)
Chloride: 104 mEq/L (ref 96–112)
Creatinine, Ser: 0.85 mg/dL (ref 0.50–1.10)
GFR calc non Af Amer: 63 mL/min — ABNORMAL LOW (ref >90)
Glucose, Bld: 121 mg/dL — ABNORMAL HIGH (ref 70–99)
Total Bilirubin: 0.3 mg/dL (ref 0.3–1.2)

## 2011-11-26 LAB — ABO/RH: ABO/RH(D): O NEG

## 2011-11-26 LAB — PROTIME-INR
INR: 1.07 (ref 0.00–1.49)
Prothrombin Time: 14.1 seconds (ref 11.6–15.2)

## 2011-11-26 MED ORDER — FLUDEOXYGLUCOSE F - 18 (FDG) INJECTION
16.5000 | Freq: Once | INTRAVENOUS | Status: AC | PRN
  Administered 2011-11-26: 16.5 via INTRAVENOUS

## 2011-11-26 MED ORDER — DEXTROSE 5 % IV SOLN
1.5000 g | INTRAVENOUS | Status: DC
  Filled 2011-11-26: qty 1.5

## 2011-11-26 NOTE — Progress Notes (Signed)
Patient informed Nurse that she had a stress test many years ago but was not aware of where it took place. Patient denied having a cardiac cath, or sleep study.

## 2011-11-26 NOTE — Pre-Procedure Instructions (Addendum)
20 KAIRI HARSHBARGER  11/26/2011   Your procedure is scheduled on:  Friday November 27, 2011.  Report to [facility] at 0530 AM.  Call this number if you have problems the morning of surgery: [phone]   Remember:   Do not eat food or drink:After Midnight.    Take these medicines the morning of surgery with A SIP OF WATER:  Nebivolol (Bystolic), and Diltiazem gel   Do not wear jewelry, make-up or nail polish.  Do not wear lotions, powders, or perfumes.  Do not shave 48 hours prior to surgery.  Do not bring valuables to the hospital.  Contacts, dentures or bridgework may not be worn into surgery.  Leave suitcase in the car. After surgery it may be brought to your room.  For patients admitted to the hospital, checkout time is 11:00 AM the day of discharge.   Patients discharged the day of surgery will not be allowed to drive home.  Name and phone number of your driver:   Special Instructions: CHG Shower Use Special Wash: 1/2 bottle night before surgery and 1/2 bottle morning of surgery.   Please read over the following fact sheets that you were given: Pain Booklet, Coughing and Deep Breathing, Blood Transfusion Information, MRSA Information and Surgical Site Infection Prevention

## 2011-11-27 ENCOUNTER — Ambulatory Visit (HOSPITAL_COMMUNITY)
Admission: RE | Admit: 2011-11-27 | Discharge: 2011-11-27 | Disposition: A | Discharge status: Home / Self Care | Payer: Medicare Other | Source: Ambulatory Visit | Attending: Thoracic Surgery (Cardiothoracic Vascular Surgery) | Admitting: Thoracic Surgery (Cardiothoracic Vascular Surgery)

## 2011-11-27 ENCOUNTER — Encounter (HOSPITAL_COMMUNITY): Payer: Self-pay | Admitting: *Deleted

## 2011-11-27 ENCOUNTER — Encounter (HOSPITAL_COMMUNITY)
Admission: RE | Disposition: A | Discharge status: Home / Self Care | Payer: Self-pay | Source: Ambulatory Visit | Attending: Thoracic Surgery (Cardiothoracic Vascular Surgery)

## 2011-11-27 ENCOUNTER — Other Ambulatory Visit: Payer: Self-pay | Admitting: *Deleted

## 2011-11-27 ENCOUNTER — Encounter (HOSPITAL_COMMUNITY): Payer: Self-pay | Admitting: Certified Registered"

## 2011-11-27 ENCOUNTER — Ambulatory Visit (HOSPITAL_COMMUNITY): Payer: Medicare Other | Admitting: Certified Registered"

## 2011-11-27 DIAGNOSIS — K449 Diaphragmatic hernia without obstruction or gangrene: Secondary | ICD-10-CM | POA: Insufficient documentation

## 2011-11-27 DIAGNOSIS — Z01818 Encounter for other preprocedural examination: Secondary | ICD-10-CM | POA: Insufficient documentation

## 2011-11-27 DIAGNOSIS — C771 Secondary and unspecified malignant neoplasm of intrathoracic lymph nodes: Secondary | ICD-10-CM | POA: Insufficient documentation

## 2011-11-27 DIAGNOSIS — R599 Enlarged lymph nodes, unspecified: Secondary | ICD-10-CM | POA: Insufficient documentation

## 2011-11-27 DIAGNOSIS — K219 Gastro-esophageal reflux disease without esophagitis: Secondary | ICD-10-CM | POA: Insufficient documentation

## 2011-11-27 DIAGNOSIS — C801 Malignant (primary) neoplasm, unspecified: Secondary | ICD-10-CM | POA: Insufficient documentation

## 2011-11-27 DIAGNOSIS — I10 Essential (primary) hypertension: Secondary | ICD-10-CM | POA: Insufficient documentation

## 2011-11-27 DIAGNOSIS — F419 Anxiety disorder, unspecified: Secondary | ICD-10-CM

## 2011-11-27 DIAGNOSIS — J4489 Other specified chronic obstructive pulmonary disease: Secondary | ICD-10-CM | POA: Insufficient documentation

## 2011-11-27 DIAGNOSIS — Z01810 Encounter for preprocedural cardiovascular examination: Secondary | ICD-10-CM | POA: Insufficient documentation

## 2011-11-27 DIAGNOSIS — Z01812 Encounter for preprocedural laboratory examination: Secondary | ICD-10-CM | POA: Insufficient documentation

## 2011-11-27 DIAGNOSIS — R222 Localized swelling, mass and lump, trunk: Secondary | ICD-10-CM | POA: Insufficient documentation

## 2011-11-27 DIAGNOSIS — J449 Chronic obstructive pulmonary disease, unspecified: Secondary | ICD-10-CM | POA: Insufficient documentation

## 2011-11-27 DIAGNOSIS — D381 Neoplasm of uncertain behavior of trachea, bronchus and lung: Secondary | ICD-10-CM

## 2011-11-27 DIAGNOSIS — E279 Disorder of adrenal gland, unspecified: Secondary | ICD-10-CM | POA: Insufficient documentation

## 2011-11-27 HISTORY — DX: Epistaxis: R04.0

## 2011-11-27 LAB — GLUCOSE, CAPILLARY: Glucose-Capillary: 95 mg/dL (ref 70–99)

## 2011-11-27 SURGERY — BRONCHOSCOPY, WITH EBUS
Anesthesia: General | Site: Chest | Wound class: Clean Contaminated

## 2011-11-27 MED ORDER — ALPRAZOLAM 0.25 MG PO TABS: 0.2500 mg | ORAL_TABLET | Freq: Every evening | ORAL | Status: AC | PRN

## 2011-11-27 MED ORDER — ACETAMINOPHEN 650 MG RE SUPP: 650.0000 mg | RECTAL | Status: DC | PRN

## 2011-11-27 MED ORDER — ACETAMINOPHEN 325 MG PO TABS: 650.0000 mg | ORAL_TABLET | ORAL | Status: DC | PRN

## 2011-11-27 MED ORDER — ONDANSETRON HCL 4 MG/2ML IJ SOLN
INTRAMUSCULAR | Status: DC | PRN
  Administered 2011-11-27: 4 mg via INTRAVENOUS

## 2011-11-27 MED ORDER — SODIUM CHLORIDE 0.9 % IV SOLN: 250.0000 mL | INTRAVENOUS | Status: DC | PRN

## 2011-11-27 MED ORDER — PROPOFOL 10 MG/ML IV EMUL
INTRAVENOUS | Status: DC | PRN
  Administered 2011-11-27: 150 mg via INTRAVENOUS

## 2011-11-27 MED ORDER — LIDOCAINE HCL 4 % MT SOLN
OROMUCOSAL | Status: DC | PRN
  Administered 2011-11-27: 4 mL via TOPICAL

## 2011-11-27 MED ORDER — LIDOCAINE HCL (CARDIAC) 20 MG/ML IV SOLN
INTRAVENOUS | Status: DC | PRN
  Administered 2011-11-27: 60 mg via INTRAVENOUS

## 2011-11-27 MED ORDER — 0.9 % SODIUM CHLORIDE (POUR BTL) OPTIME
TOPICAL | Status: DC | PRN
  Administered 2011-11-27: 1000 mL

## 2011-11-27 MED ORDER — HYDROMORPHONE HCL PF 1 MG/ML IJ SOLN: 0.2500 mg | INTRAMUSCULAR | Status: DC | PRN

## 2011-11-27 MED ORDER — LACTATED RINGERS IV SOLN
INTRAVENOUS | Status: DC | PRN
  Administered 2011-11-27: 07:00:00 via INTRAVENOUS

## 2011-11-27 MED ORDER — SODIUM CHLORIDE 0.9 % IJ SOLN: 3.0000 mL | INTRAMUSCULAR | Status: DC | PRN

## 2011-11-27 MED ORDER — ROCURONIUM BROMIDE 100 MG/10ML IV SOLN
INTRAVENOUS | Status: DC | PRN
  Administered 2011-11-27: 40 mg via INTRAVENOUS

## 2011-11-27 MED ORDER — NEOSTIGMINE METHYLSULFATE 1 MG/ML IJ SOLN
INTRAMUSCULAR | Status: DC | PRN
  Administered 2011-11-27: 3 mg via INTRAVENOUS

## 2011-11-27 MED ORDER — GLYCOPYRROLATE 0.2 MG/ML IJ SOLN
INTRAMUSCULAR | Status: DC | PRN
  Administered 2011-11-27: 0.4 mg via INTRAVENOUS

## 2011-11-27 MED ORDER — ONDANSETRON HCL 4 MG/2ML IJ SOLN: 4.0000 mg | Freq: Once | INTRAMUSCULAR | Status: DC | PRN

## 2011-11-27 MED ORDER — SODIUM CHLORIDE 0.9 % IJ SOLN: 3.0000 mL | Freq: Two times a day (BID) | INTRAMUSCULAR | Status: DC

## 2011-11-27 MED ORDER — ONDANSETRON HCL 4 MG/2ML IJ SOLN: 4.0000 mg | Freq: Four times a day (QID) | INTRAMUSCULAR | Status: DC | PRN

## 2011-11-27 SURGICAL SUPPLY — 59 items
APPLIER CLIP LOGIC TI 5 (MISCELLANEOUS) IMPLANT
BLADE SURG 15 STRL LF DISP TIS (BLADE) IMPLANT
BLADE SURG 15 STRL SS (BLADE)
BRUSH CYTOL CELLEBRITY 1.5X140 (MISCELLANEOUS) IMPLANT
CANISTER SUCTION 2500CC (MISCELLANEOUS) ×2 IMPLANT
CLIP TI MEDIUM 6 (CLIP) IMPLANT
CLOTH BEACON ORANGE TIMEOUT ST (SAFETY) ×2 IMPLANT
CONT SPEC 4OZ CLIKSEAL STRL BL (MISCELLANEOUS) ×4 IMPLANT
COTTONBALL LRG STERILE PKG (GAUZE/BANDAGES/DRESSINGS) IMPLANT
COVER SURGICAL LIGHT HANDLE (MISCELLANEOUS) IMPLANT
COVER TABLE BACK 60X90 (DRAPES) ×2 IMPLANT
DRAPE LAPAROTOMY T 102X78X121 (DRAPES) IMPLANT
ELECT REM PT RETURN 9FT ADLT (ELECTROSURGICAL)
ELECTRODE REM PT RTRN 9FT ADLT (ELECTROSURGICAL) IMPLANT
FORCEPS BIOP RJ4 1.8 (CUTTING FORCEPS) IMPLANT
GAUZE SPONGE 4X4 16PLY XRAY LF (GAUZE/BANDAGES/DRESSINGS) IMPLANT
GLOVE BIOGEL PI IND STRL 6.5 (GLOVE) ×1 IMPLANT
GLOVE BIOGEL PI INDICATOR 6.5 (GLOVE) ×1
GLOVE EUDERMIC 7 POWDERFREE (GLOVE) ×2 IMPLANT
GLOVE SURG SIGNA 7.5 PF LTX (GLOVE) ×2 IMPLANT
GOWN PREVENTION PLUS XLARGE (GOWN DISPOSABLE) IMPLANT
GOWN STRL NON-REIN LRG LVL3 (GOWN DISPOSABLE) ×2 IMPLANT
HEMOSTAT SURGICEL 2X14 (HEMOSTASIS) IMPLANT
KIT BASIN OR (CUSTOM PROCEDURE TRAY) IMPLANT
KIT ROOM TURNOVER OR (KITS) ×2 IMPLANT
MARKER SKIN DUAL TIP RULER LAB (MISCELLANEOUS) ×2 IMPLANT
NEEDLE 22X1 1/2 (OR ONLY) (NEEDLE) IMPLANT
NEEDLE BIOPSY TRANSBRONCH 21G (NEEDLE) IMPLANT
NEEDLE BLUNT 18X1 FOR OR ONLY (NEEDLE) IMPLANT
NEEDLE SYS SONOTIP II EBUSTBNA (NEEDLE) ×2 IMPLANT
NS IRRIG 1000ML POUR BTL (IV SOLUTION) ×2 IMPLANT
OIL SILICONE PENTAX (PARTS (SERVICE/REPAIRS)) ×2 IMPLANT
PACK SURGICAL SETUP 50X90 (CUSTOM PROCEDURE TRAY) IMPLANT
PAD ARMBOARD 7.5X6 YLW CONV (MISCELLANEOUS) ×4 IMPLANT
PENCIL BUTTON HOLSTER BLD 10FT (ELECTRODE) IMPLANT
SPONGE GAUZE 4X4 12PLY (GAUZE/BANDAGES/DRESSINGS) ×2 IMPLANT
SPONGE INTESTINAL PEANUT (DISPOSABLE) IMPLANT
SUT SILK 2 0 TIES 10X30 (SUTURE) IMPLANT
SUT VIC AB 2-0 CT1 27 (SUTURE)
SUT VIC AB 2-0 CT1 TAPERPNT 27 (SUTURE) IMPLANT
SUT VIC AB 3-0 SH 18 (SUTURE) IMPLANT
SUT VIC AB 3-0 SH 27 (SUTURE)
SUT VIC AB 3-0 SH 27X BRD (SUTURE) IMPLANT
SUT VICRYL 4-0 PS2 18IN ABS (SUTURE) IMPLANT
SWAB COLLECTION DEVICE MRSA (MISCELLANEOUS) IMPLANT
SYR 20ML ECCENTRIC (SYRINGE) ×4 IMPLANT
SYR 5ML LUER SLIP (SYRINGE) ×2 IMPLANT
SYR CONTROL 10ML LL (SYRINGE) IMPLANT
SYRINGE 10CC LL (SYRINGE) ×2 IMPLANT
TOWEL OR 17X24 6PK STRL BLUE (TOWEL DISPOSABLE) IMPLANT
TOWEL OR 17X26 10 PK STRL BLUE (TOWEL DISPOSABLE) IMPLANT
TRAP SPECIMEN MUCOUS 40CC (MISCELLANEOUS) ×2 IMPLANT
TRAY FOLEY IC TEMP SENS 14FR (CATHETERS) IMPLANT
TUBE ANAEROBIC SPECIMEN COL (MISCELLANEOUS) IMPLANT
TUBE CONNECTING 12X1/4 (SUCTIONS) ×2 IMPLANT
VALVE BIOPSY  SINGLE USE (MISCELLANEOUS)
VALVE BIOPSY SINGLE USE (MISCELLANEOUS) IMPLANT
VALVE SUCTION BRONCHIO DISP (MISCELLANEOUS) IMPLANT
WATER STERILE IRR 1000ML POUR (IV SOLUTION) IMPLANT

## 2011-11-27 NOTE — Discharge Instructions (Signed)
Do not drive for 24 hours.  You may resume full activities tomorrow  You may cough up a small amount of blood over the next couple of days.  Instructions Following General Anesthetic, Adult A nurse specialized in giving anesthesia (anesthetist) or a doctor specialized in giving anesthesia (anesthesiologist) gave you a medicine that made you sleep while a procedure was performed. For as long as 24 hours following this procedure, you may feel:  Dizzy.   Weak.   Drowsy.  AFTER THE PROCEDURE After surgery, you will be taken to the recovery area where a nurse will monitor your progress. You will be allowed to go home when you are awake, stable, taking fluids well, and without complications. For the first 24 hours following an anesthetic:  Have a responsible person with you.   Do not drive a car. If you are alone, do not take public transportation.   Do not drink alcohol.   Do not take medicine that has not been prescribed by your caregiver.   Do not sign important papers or make important decisions.   You may resume normal diet and activities as directed.   Change bandages (dressings) as directed.   Only take over-the-counter or prescription medicines for pain, discomfort, or fever as directed by your caregiver.  If you have questions or problems that seem related to the anesthetic, call the hospital and ask for the anesthetist or anesthesiologist on call. SEEK IMMEDIATE MEDICAL CARE IF:   You develop a rash.   You have difficulty breathing.   You have chest pain.   You develop any allergic problems.  Document Released: 07/13/2000 Document Revised: 03/26/2011 Document Reviewed: 02/21/2007 [facility] Patient Information 2012 [place], [place].

## 2011-11-27 NOTE — H&P (View-Only) (Signed)
PCP is GATES,ROBERT NEVILL, MD Referring Provider is Husain, Karrar, MD  Chief Complaint  Patient presents with  . Lung Mass    Referral from Dr Husain/ Dr Gates for eval on Rt lung mass, mediastinal adenopathy, Chest CT 11/17/2011   . Adenopathy    HPI: 78-year-old woman presents with chief complaint of enlarged lymph nodes.  [name] is an 78-year-old woman who recently was vacationing in [place]. She developed some left-sided neck pain and went to the hospital. A CT scan of the neck was done and there was signs of mediastinal adenopathy noted. Her pain was treated with Vicodin and has improved. When she returned to [place] she saw Dr. Husain in had a CT of the chest done which showed a right middle lobe mass, bulky hilar or mediastinal adenopathy, and a 1.5 cm right adrenal mass. She denies cough, hemoptysis, shortness of breath or chest pain. She has been having headaches for the last couple of weeks. And over the past week has noted several dizzy spells, which come on spontaneously and last about 5 minutes. She has not had any weight loss. She denies fevers, chills or sweats.   Past Medical History  Diagnosis Date  . Cervical spondylosis   . Renal cyst     simple bilateral 2008  . Diverticulosis   . Vertigo, benign positional   . Hyperlipidemia   . Aortic sclerosis     by exam,  . Erosive osteoarthritis of hand   . History of calcium pyrophosphate deposition disease (CPPD)   . Gout     vs pseudo gout flares in past  . Mediastinal adenopathy   . Tobacco use   . COPD, mild   . Glaucoma   . Hypertension   . Osteopenia   . Hiatal hernia   . Hemorrhoids   . H/O urinary incontinence   . Childhood asthma     Past Surgical History  Procedure Date  . Cystoscopy, transurethral collagen injection therapy   . Left total knee arthroplasty   . Appendectomy   . Hemorrhoid surgery   . Cataract surgery x 2     Family History  Problem Relation Age of Onset  . Heart  disease Father   . Cirrhosis Mother     Social History History  Substance Use Topics  . Smoking status: Former Smoker -- 2.0 packs/day for 35 years    Types: Cigarettes    Quit date: 04/21/2003  . Smokeless tobacco: Never Used  . Alcohol Use: Yes     rare wine    Current Outpatient Prescriptions  Medication Sig Dispense Refill  . ascorbic acid (VITAMIN C) 500 MG tablet Take 500 mg by mouth daily.      . aspirin 81 MG tablet Take 81 mg by mouth daily.      . Azelastine-Fluticasone (DYMISTA) 137-50 MCG/ACT SUSP Place into the nose daily as needed.      . Calcium-Vitamin D (CALTRATE 600 PLUS-VIT D PO) Take by mouth daily.      . colchicine 0.6 MG tablet Take 0.6 mg by mouth daily.      . diclofenac sodium (VOLTAREN) 1 % GEL Apply 1 application topically every 6 (six) hours.      . estrogens, conjugated, (PREMARIN) 0.625 MG tablet Take 0.625 mg by mouth daily. Take daily for 21 days then do not take for 7 days.      . fish oil-omega-3 fatty acids 1000 MG capsule Take 2 g by mouth daily.      .   hydrocortisone (ANUSOL-HC) 25 MG suppository Place 25 mg rectally 2 (two) times daily as needed.      . loteprednol (LOTEMAX) 0.2 % SUSP 1 drop 4 (four) times daily.      . Multiple Vitamin (MULTIVITAMIN) tablet Take 1 tablet by mouth daily.      . nebivolol (BYSTOLIC) 5 MG tablet Take 5 mg by mouth daily.      . timolol (TIMOPTIC-XR) 0.5 % ophthalmic gel-forming Apply 1 drop to eye daily.        Allergies  Allergen Reactions  . Shellfish Allergy Anaphylaxis and Hives    NO betadine or iodine allergy  . Septra Ds (Sulfamethoxazole-Tmp Ds) Nausea And Vomiting  . Statins Other (See Comments)    Severe joint pains  . Tape     Adhesive only. (Paper tape OK)  . Wellbutrin (Bupropion) Other (See Comments)    Shaking all over     Review of Systems  Gastrointestinal:       Ulcers  Musculoskeletal: Positive for joint swelling and arthralgias.       Left TKR, right hip pain  Neurological:  Positive for dizziness and headaches.       "ministroke"  Psychiatric/Behavioral:       Anxiety  All other systems reviewed and are negative.    BP 188/92  Pulse 72  Resp 18  Ht 5' 1" (1.549 m)  Wt 162 lb (73.483 kg)  BMI 30.61 kg/m2  SpO2 97% Physical Exam  Constitutional: She is oriented to person, place, and time. She appears well-developed and well-nourished. No distress.  HENT:  Head: Normocephalic and atraumatic.  Eyes: EOM are normal. Pupils are equal, round, and reactive to light.  Neck: Neck supple. No thyromegaly present.  Cardiovascular: Normal rate, regular rhythm and normal heart sounds.   No murmur heard. Pulmonary/Chest: Breath sounds normal. She has no wheezes. She has no rales.  Abdominal: Soft. There is no tenderness.  Musculoskeletal: She exhibits no edema.  Lymphadenopathy:    She has no cervical adenopathy.  Neurological: She is alert and oriented to person, place, and time. No cranial nerve deficit.  Skin: Skin is warm and dry.  Psychiatric:       anxious     Diagnostic Tests: CT chest 11/17/11  IMPRESSION:  There is a lobulated lesion or adjacent lesions within the  posterior right middle lobe. Maximum size of this lesion measures  up to 4.1 cm. There is extensive mediastinal and right hilar  lymphadenopathy concerning for metastatic disease. These findings  are suspicious for primary bronchogenic carcinoma. The lesion and  lymphadenopathy can be further evaluated with a PET-CT.  Indeterminate 1.2 cm nodule in the right adrenal gland.  Differential diagnosis would include an adrenal adenoma versus a  small metastatic lesion.    Impression: 78-year-old woman with a 70-pack-year history of tobacco abuse who quit in 2005. She now presents with a right middle lobe mass and bulky hilar or mediastinal adenopathy as well as a questionable adrenal lesion. This almost certainly represents lung cancer, and could be either small cell or non-small cell.  She needs biopsy for diagnostic purposes and to guide treatment.  I recommended to her that we proceed with bronchoscopy and endobronchial ultrasound and possible mediastinoscopy. I strongly suspect that the ultrasound guided biopsies will be positive and mediastinoscopy is unlikely be required I described the procedure to her and her family. They understand the need for general anesthesia. They understand the indications, risks, benefits, and alternatives. They understand   the risks include but are not limited to those associated with general anesthesia, MI, DVT, bleeding, vocal cord paralysis and pneumothorax. They do understand this procedure generally done outpatient basis and she should be able to go home the afternoon of the procedure.  She needs a PET/CT for staging purposes. She also needs a brain MR (or CT if she cannot tolerate MR) given her headaches and dizziness  Plan: Bronchoscopy, EBUS, possible mediastinoscopy PET/CT MRI brain (or CT as a backup plan)  

## 2011-11-27 NOTE — Anesthesia Postprocedure Evaluation (Signed)
  Anesthesia Post-op Note  Patient: [name]  Procedure(s) Performed: Procedure(s) (LRB): VIDEO BRONCHOSCOPY WITH ENDOBRONCHIAL ULTRASOUND (N/A)  Patient Location: PACU  Anesthesia Type: General  Level of Consciousness: awake, alert  and oriented  Airway and Oxygen Therapy: Patient Spontanous Breathing and Patient connected to nasal cannula oxygen  Post-op Pain: none  Post-op Assessment: Post-op Vital signs reviewed  Post-op Vital Signs: Reviewed  Complications: No apparent anesthesia complications

## 2011-11-27 NOTE — Transfer of Care (Signed)
Immediate Anesthesia Transfer of Care Note  Patient: [name]  Procedure(s) Performed: Procedure(s) (LRB): VIDEO BRONCHOSCOPY WITH ENDOBRONCHIAL ULTRASOUND (N/A)  Patient Location: PACU  Anesthesia Type: General  Level of Consciousness: awake, alert  and oriented  Airway & Oxygen Therapy: Patient Spontanous Breathing and Patient connected to nasal cannula oxygen  Post-op Assessment: Report given to PACU RN and Post -op Vital signs reviewed and stable  Post vital signs: Reviewed and stable  Complications: No apparent anesthesia complications

## 2011-11-27 NOTE — Interval H&P Note (Signed)
History and Physical Interval Note:  11/27/2011 7:33 AM  Kathlynn Grate  has presented today for surgery, with the diagnosis of (R) LUNG MASS, ADENOPATHY  The various methods of treatment have been discussed with the patient and family. After consideration of risks, benefits and other options for treatment, the patient has consented to  Procedure(s) (LRB): VIDEO BRONCHOSCOPY WITH ENDOBRONCHIAL ULTRASOUND (N/A) MEDIASTINOSCOPY (N/A) as a surgical intervention .  The patient's history has been reviewed, patient examined, no change in status, stable for surgery.  I have reviewed the patient's chart and labs.  Questions were answered to the patient's satisfaction.     HENDRICKSON,STEVEN C

## 2011-11-27 NOTE — Brief Op Note (Signed)
11/27/2011  8:47 AM  PATIENT:  [name]  78 y.o. female  PRE-OPERATIVE DIAGNOSIS:  Right lung mass, adenopathy  POST-OPERATIVE DIAGNOSIS:  Right lung mass, adenopathy  PROCEDURE:  Procedure(s) (LRB): VIDEO BRONCHOSCOPY WITH ENDOBRONCHIAL ULTRASOUND (N/A)  SURGEON:  Surgeon(s) and Role:    * Loreli Slot, MD - Primary  ANESTHESIA:   general   SPECIMEN:  Source of Specimen:  4R,7,4L lymph node aspirations  DISPOSITION OF SPECIMEN:  PATHOLOGY  PLAN OF CARE: Discharge to home after PACU  PATIENT DISPOSITION:  PACU - hemodynamically stable.   Delay start of Pharmacological VTE agent (>24hrs) due to surgical blood loss or risk of bleeding: not applicable

## 2011-11-27 NOTE — Anesthesia Preprocedure Evaluation (Addendum)
Anesthesia Evaluation  Patient identified by MRN, date of birth, ID band Patient awake    Reviewed: Allergy & Precautions, H&P , NPO status , Patient's Chart, lab work & pertinent test results, reviewed documented beta blocker date and time   History of Anesthesia Complications Negative for: history of anesthetic complications  Airway Mallampati: III TM Distance: >3 FB Neck ROM: Limited  Mouth opening: Limited Mouth Opening  Dental  (+) Edentulous Upper, Partial Lower and Missing   Pulmonary asthma , COPDformer smoker,  breath sounds clear to auscultation        Cardiovascular hypertension, Pt. on medications and Pt. on home beta blockers Rhythm:Regular Rate:Normal     Neuro/Psych TIA Neuromuscular disease    GI/Hepatic hiatal hernia, GERD-  ,  Endo/Other    Renal/GU      Musculoskeletal   Abdominal   Peds  Hematology   Anesthesia Other Findings   Reproductive/Obstetrics                          Anesthesia Physical Anesthesia Plan  ASA: III  Anesthesia Plan: General   Post-op Pain Management:    Induction: Intravenous  Airway Management Planned: Oral ETT  Additional Equipment:   Intra-op Plan:   Post-operative Plan: Extubation in OR  Informed Consent: I have reviewed the patients History and Physical, chart, labs and discussed the procedure including the risks, benefits and alternatives for the proposed anesthesia with the patient or authorized representative who has indicated his/her understanding and acceptance.   Dental advisory given  Plan Discussed with: Anesthesiologist, Surgeon and CRNA  Anesthesia Plan Comments:        Anesthesia Quick Evaluation

## 2011-11-27 NOTE — Anesthesia Procedure Notes (Signed)
Procedure Name: Intubation Date/Time: 11/27/2011 7:43 AM Performed by: Arlice Colt B Pre-anesthesia Checklist: Patient identified, Emergency Drugs available, Suction available, Patient being monitored and Timeout performed Patient Re-evaluated:Patient Re-evaluated prior to inductionOxygen Delivery Method: Circle system utilized Preoxygenation: Pre-oxygenation with 100% oxygen Intubation Type: IV induction Ventilation: Mask ventilation without difficulty Laryngoscope Size: Mac and 3 Grade View: Grade I Tube type: Oral Tube size: 8.5 mm Number of attempts: 1 Airway Equipment and Method: Stylet Placement Confirmation: ETT inserted through vocal cords under direct vision,  positive ETCO2 and breath sounds checked- equal and bilateral Secured at: 21 cm Tube secured with: Tape Dental Injury: Teeth and Oropharynx as per pre-operative assessment

## 2011-11-28 NOTE — Op Note (Signed)
Teresa, [name]                  ACCOUNT NO.:  [id number]  MEDICAL RECORD NO.:  [id number]  LOCATION:  [facility]                         FACILITY:  [facility]  PHYSICIAN:  Salvatore Decent. Dorris Fetch, M.D.DATE OF BIRTH:  09-Aug-1928  DATE OF PROCEDURE:  11/27/2011 DATE OF DISCHARGE:  11/27/2011                              OPERATIVE REPORT   PREOPERATIVE DIAGNOSES:  Right lung mass and mediastinal adenopathy.  POSTOPERATIVE DIAGNOSIS:  Stage IIIB non-small cell carcinoma.  PROCEDURE:  Bronchoscopy, endobronchial ultrasound.  SURGEON:  Salvatore Decent. Dorris Fetch, M.D.  ANESTHESIA:  General.  FINDINGS:  Bulky nodes easily identified.  Samples taken from 4R, 7, 4L nodes.  4R and 7 on quick prep, positive for non-small cell carcinoma.  CLINICAL NOTE:  [name] is an 78 year old woman who recently was having neck pain.  CT scan showed signs of mediastinal adenopathy.  A chest CT showed a right lung mass and bulky hilar and mediastinal adenopathy, as well as a 1.5-cm right adrenal mass.  PET scan showed these lesions to be metabolically active.  She was advised to undergo bronchoscopy and endobronchial ultrasound and possible mediastinoscopy for diagnosis and staging.  The indications, risks, benefits, and alternatives were discussed in detail with the patient.  She understood and accepted the risks and agreed to proceed.  OPERATIVE NOTE:  [name] was brought to the preoperative holding area on November 27, 2011, there Anesthesia placed intravenous access and she was taken to the operating room, anesthetized and intubated.  Flexible fiberoptic bronchoscopy was performed via the endotracheal tube.  There were no endobronchial lesions and normal endobronchial anatomy.  The endobronchial ultrasound probe then was placed and lymph node stations systematically inspected.  There were bulky nodes present in the 4R, 7, and 4L locations.  These were sequentially aspirated.  A duplex was used to ensure that  there were no vessels that would be traversed with a needle.  Multiple needle aspirations were taken from each of the 3 stations.  The first two 4R and 7 nodes were sent for quick prep, which subsequently returned with non-small cell carcinoma. The remainder of the specimens were sent for permanent pathology. The EBUS scope was withdrawn.  The bronchoscope was reinserted.  There was small amount of blood from the biopsy sites, this was irrigated clear and there was no ongoing bleeding.  The patient was extubated in the operating room and taken to the Post-Anesthetic Care Unit in good condition.     Salvatore Decent Dorris Fetch, M.D.     SCH/MEDQ  D:  11/27/2011  T:  11/28/2011  Job:  [id number]

## 2011-11-29 LAB — CULTURE, RESPIRATORY W GRAM STAIN

## 2011-11-30 ENCOUNTER — Telehealth: Payer: Self-pay | Admitting: Hematology & Oncology

## 2011-11-30 ENCOUNTER — Ambulatory Visit
Admission: RE | Admit: 2011-11-30 | Discharge: 2011-11-30 | Disposition: A | Discharge status: Home / Self Care | Payer: Medicare Other | Source: Ambulatory Visit | Attending: Thoracic Surgery (Cardiothoracic Vascular Surgery) | Admitting: Thoracic Surgery (Cardiothoracic Vascular Surgery)

## 2011-11-30 DIAGNOSIS — R51 Headache: Secondary | ICD-10-CM

## 2011-11-30 DIAGNOSIS — R42 Dizziness and giddiness: Secondary | ICD-10-CM

## 2011-11-30 MED ORDER — GADOBENATE DIMEGLUMINE 529 MG/ML IV SOLN
15.0000 mL | Freq: Once | INTRAVENOUS | Status: AC | PRN
  Administered 2011-11-30: 15 mL via INTRAVENOUS

## 2011-11-30 NOTE — Telephone Encounter (Signed)
Called to give pt schedule she is going to see Dr. Arbutus Ped

## 2011-12-01 ENCOUNTER — Other Ambulatory Visit: Payer: Medicare Other | Admitting: Lab

## 2011-12-01 ENCOUNTER — Ambulatory Visit: Payer: Medicare Other | Admitting: Hematology & Oncology

## 2011-12-01 ENCOUNTER — Ambulatory Visit: Payer: Medicare Other

## 2011-12-03 ENCOUNTER — Encounter: Payer: Self-pay | Admitting: *Deleted

## 2011-12-03 ENCOUNTER — Encounter: Payer: Self-pay | Admitting: Internal Medicine

## 2011-12-03 ENCOUNTER — Ambulatory Visit (HOSPITAL_BASED_OUTPATIENT_CLINIC_OR_DEPARTMENT_OTHER): Payer: Medicare Other | Admitting: Internal Medicine

## 2011-12-03 VITALS — BP 166/79 | HR 74 | Temp 98.4°F | Resp 18 | Ht 61.0 in | Wt 162.0 lb

## 2011-12-03 DIAGNOSIS — C342 Malignant neoplasm of middle lobe, bronchus or lung: Secondary | ICD-10-CM

## 2011-12-03 DIAGNOSIS — C3490 Malignant neoplasm of unspecified part of unspecified bronchus or lung: Secondary | ICD-10-CM

## 2011-12-03 DIAGNOSIS — R599 Enlarged lymph nodes, unspecified: Secondary | ICD-10-CM

## 2011-12-03 NOTE — Progress Notes (Signed)
[facility]  Clinical Social Work  Clinical Social Work met with patient, patient's daughter, patient's friend, and Systems developer during MTOC today.  Dr. Arbutus Ped discussed patient's diagnosis and treatment plan options.  Patient and patient's family asked many questions during visit today to better understand her disease and prognosis.  [name] reports that she has few family members in the area but a large support network of friends.  She expresses some anxiety regarding her current situation and states her biggest concern is "getting started".  CSW plans to follow up with patient at a later time to meet with patient individually.  [name], MSW, [facility] Clinical Social Worker [facility] [phone]

## 2011-12-04 ENCOUNTER — Ambulatory Visit: Payer: Medicare Other | Admitting: Hematology & Oncology

## 2011-12-04 ENCOUNTER — Ambulatory Visit: Payer: Medicare Other

## 2011-12-04 ENCOUNTER — Other Ambulatory Visit: Payer: Medicare Other | Admitting: Lab

## 2011-12-05 ENCOUNTER — Other Ambulatory Visit (HOSPITAL_COMMUNITY): Payer: Self-pay | Admitting: Internal Medicine

## 2011-12-05 DIAGNOSIS — C3490 Malignant neoplasm of unspecified part of unspecified bronchus or lung: Secondary | ICD-10-CM | POA: Insufficient documentation

## 2011-12-05 NOTE — Progress Notes (Signed)
[facility] [phone]   Fax:([phone]) [phone]  CONSULT NOTE  REASON FOR CONSULTATION:  78 years old white female diagnosed with lung cancer.  HPI [name] is a 78 y.o. female was past medical history significant for COPD, hypertension, dyslipidemia, history of diverticulosis, osteopenia and osteoarthritis as well as TIA in 2001. The patient was on vacation in [place] city when she developed left-sided neck pain that was getting worse and she was evaluated at the local hospital. CT scan of the neck performed at that time showed signs of mediastinal adenopathy. She was treated with Vicodin and was asked to see her primary care physician and [facility] for further evaluation. Dr. Cathey Endow, her primary care physician was out of town and she was seen by one of his partners Dr. Delrae Sawyers will order CT scan of the chest. This was performed on 11/17/2011 and it showed massive mediastinal lymphadenopathy. Subcarinal lymph node measures 3.3 cm in the short axis. A precarinal lymph node measures 3.1 cm in the short axis. Right hilar node measures 1.7 cm in the short axis. Right hilar node measures 1.4 cm. No significant pericardial or pleural fluid. There is no  significant left hilar lymphadenopathy. No definite supraclavicular lymphadenopathy. No evidence for axillary lymphadenopathy. There is a lesion or  adjacent lesions in the right middle lobe along the right major fissure. Small bulla or air pockets adjacent to this lesion. The lesion has areas of high density which could represent blood products. The mass or masses roughly measures 3.1 x 2.2 x 4.1 cm. the patient was then referred to Dr. Dorris Fetch and a PET scan was performed on 11/26/2011 and it showed 3.5 cm right middle lobe hypermetabolic mass, consistent with primary bronchogenic carcinoma. Metastatic lymphadenopathy in the right hilum, mediastinum, and left supraclavicular region.  3. 1.3 cm right adrenal  metastasis. MRI of the brain on 11/30/2011 showed no acute or metastatic intracranial abnormalities. On 11/28/2011 the patient underwent bronchoscopy with endobronchial ultrasound under the care of Dr. Dorris Fetch and the final pathology showed malignant cells. The malignancies were consistent with non-small cell carcinoma favoring adenocarcinoma. The patient was referred to me today for evaluation and recommendation regarding treatment of her metastatic lung cancer. The patient is feeling fine with no specific complaints except for pain in the left knee secondary to left knee replacement she also has shortness breath with exertion and cough productive of whitish sputum no history of hemoptysis. She denied having any significant weight loss or night sweats. No visual changes or headache. The patient has no family history of lung cancer. Her father died from heart disease.  The patient is a widow and has 2 children. She was accompanied today by her daughter [name]. She has another daughter named [name]. The patient used to work at the [employer] as a Diplomatic Services operational officer. The patient has a history of smoking more than one pack per day for around 40 years but quit 8 years ago. She drinks alcohol occasionally no history of drug abuse.   @SFHPI @  Past Medical History  Diagnosis Date  . Cervical spondylosis   . Renal cyst     simple bilateral 2008  . Diverticulosis   . Vertigo, benign positional   . Hyperlipidemia   . Aortic sclerosis     by exam,  . Erosive osteoarthritis of hand   . History of calcium pyrophosphate deposition disease (CPPD)   . Gout     vs pseudo gout flares in past  .  Mediastinal adenopathy   . Tobacco use   . COPD, mild   . Glaucoma   . Hypertension   . Osteopenia   . Hiatal hernia   . Hemorrhoids   . H/O urinary incontinence   . Childhood asthma   . Bronchitis   . TIA (transient ischemic attack)   . Claustrophobia   . Dizziness   . Bleeding from the nose     hx  of severe nose bleeds-last time was 2 yrs ago, slight bleed last week    Past Surgical History  Procedure Date  . Cystoscopy, transurethral collagen injection therapy   . Left total knee arthroplasty   . Appendectomy   . Hemorrhoid surgery   . Cataract surgery x 2   . Eye surgery   . Tonsillectomy   . Abdominal hysterectomy   . Colonoscopy     Family History  Problem Relation Age of Onset  . Heart disease Father   . Cirrhosis Mother     Social History History  Substance Use Topics  . Smoking status: Former Smoker -- 2.0 packs/day for 35 years    Types: Cigarettes    Quit date: 04/21/2003  . Smokeless tobacco: Never Used  . Alcohol Use: Yes     rare wine    Allergies  Allergen Reactions  . Shellfish Allergy Anaphylaxis and Hives    NO betadine or iodine allergy  . Septra Ds (Sulfamethoxazole-Tmp Ds) Nausea And Vomiting  . Statins Other (See Comments)    Severe joint pains  . Tape     Adhesive only. (Paper tape OK)  . Wellbutrin (Bupropion) Other (See Comments)    Shaking all over     Current Outpatient Prescriptions  Medication Sig Dispense Refill  . ALPRAZolam (XANAX) 0.25 MG tablet Take 1 tablet (0.25 mg total) by mouth at bedtime as needed for sleep (TAKE ONE TABLET 30 MINUTES BEFORE MRI...MAY REPEAT X 1 IF NCESSARY).  2 tablet  0  . aspirin 81 MG tablet Take 81 mg by mouth daily.      . B Complex-C (B-COMPLEX WITH VITAMIN C) tablet Take 1 tablet by mouth daily.      . Cholecalciferol (VITAMIN D) 2000 UNITS tablet Take 2,000 Units by mouth daily.      . colchicine 0.6 MG tablet Take 0.6 mg by mouth daily as needed. For gout flare      . diclofenac sodium (VOLTAREN) 1 % GEL Apply 1 application topically 4 (four) times daily.       Marland Kitchen diltiazem 2 % GEL Apply 1 application topically 4 (four) times daily.       Marland Kitchen estrogens, conjugated, (PREMARIN) 0.625 MG tablet Take 0.625 mg by mouth daily. Take daily for 21 days then do not take for 7 days.      . fish  oil-omega-3 fatty acids 1000 MG capsule Take 1 g by mouth 2 (two) times daily.       . Multiple Vitamin (MULTIVITAMIN WITH MINERALS) TABS Take 1 tablet by mouth daily.      . nebivolol (BYSTOLIC) 5 MG tablet Take 5 mg by mouth daily with supper.       Marland Kitchen OVER THE COUNTER MEDICATION Take 375 mg by mouth 2 (two) times daily. "Mega Beta Sitosterol 375 mg"      . OVER THE COUNTER MEDICATION Take 3 capsules by mouth daily as needed. "Tart Cherry Ultra"; for gout flare      . timolol (TIMOPTIC-XR) 0.5 % ophthalmic gel-forming Place  1 drop into the left eye daily.         Review of Systems  A comprehensive review of systems was negative except for: Constitutional: positive for fatigue Respiratory: positive for cough and dyspnea on exertion Musculoskeletal: positive for arthralgias  Physical Exam  WUJ:WJXBJ, healthy, no distress, well nourished and well developed SKIN: skin color, texture, turgor are normal HEAD: Normocephalic EYES: normal EARS: External ears normal OROPHARYNX:no exudate and no erythema  NECK: supple, no adenopathy LYMPH:  no palpable lymphadenopathy, no hepatosplenomegaly BREAST:not examined LUNGS: clear to auscultation , and palpation HEART: regular rate & rhythm, no murmurs and no gallops ABDOMEN:abdomen soft, non-tender, normal bowel sounds and no masses or organomegaly BACK: Back symmetric, no curvature. EXTREMITIES:no joint deformities, effusion, or inflammation, no edema, no skin discoloration, no clubbing, no cyanosis  NEURO: alert & oriented x 3 with fluent speech, no focal motor/sensory deficits  PERFORMANCE STATUS: ECOG 1  LABORATORY DATA: Lab Results  Component Value Date   WBC 7.6 11/26/2011   HGB 14.2 11/26/2011   HCT 42.0 11/26/2011   MCV 96.3 11/26/2011   PLT 292 11/26/2011      Chemistry      Component Value Date/Time   NA 140 11/26/2011 1041   K 4.3 11/26/2011 1041   CL 104 11/26/2011 1041   CO2 24 11/26/2011 1041   BUN 18 11/26/2011 1041   CREATININE 0.85  11/26/2011 1041      Component Value Date/Time   CALCIUM 9.7 11/26/2011 1041   ALKPHOS 82 11/26/2011 1041   AST 25 11/26/2011 1041   ALT 15 11/26/2011 1041   BILITOT 0.3 11/26/2011 1041       RADIOGRAPHIC STUDIES: Dg Chest 2 View Within Previous 72 Hours.  Films Obtained On Friday Are Acceptable For Monday And Tuesday Cases  11/26/2011  *RADIOLOGY REPORT*  Clinical Data: Preop  CHEST - 2 VIEW  Comparison: C t chest 11/17/2011  Findings: Cardiomediastinal silhouette is stable.  Again noted mediastinal and right hilar / infrahilar adenopathy.  A nodular mass in the right lower lobe measures 3.7 cm.  No destructive bony lesions are identified.  No acute infiltrate or pulmonary edema.  IMPRESSION:  Again noted mediastinal and right hilar / infrahilar adenopathy. A nodular mass in the right lower lobe measures 3.7 cm.  No destructive bony lesions are identified.  No acute infiltrate or pulmonary edema.  Original Report Authenticated By: Natasha Mead, M.D.   Ct Chest W Contrast  11/17/2011  *RADIOLOGY REPORT*  Clinical Data: Evaluate mediastinal lymphadenopathy.  CT CHEST WITH CONTRAST  Technique:  Multidetector CT imaging of the chest was performed following the standard protocol during bolus administration of intravenous contrast.  Contrast: 75mL OMNIPAQUE IOHEXOL 300 MG/ML  SOLN  Comparison: None.  Findings: There is massive mediastinal lymphadenopathy.  Subcarinal lymph node measures 3.3 cm in the short axis on sequence #3, image 26.  A precarinal lymph node measures 3.1 cm in the short axis on image number 21.  Right hilar node measures 1.7 cm in the short axis on image 29.  Right hilar node measures 1.4 cm on image 33. No significant pericardial or pleural fluid.  There is no significant left hilar lymphadenopathy.  No definite supraclavicular lymphadenopathy.  No evidence for axillary lymphadenopathy.  The thoracic aorta is heavily calcified without aneurysmal dilatation.  Visualized aspects of the liver are  normal.  No gross abnormality to the left adrenal gland.  However, there is low density nodule involving the right adrenal measuring up  to 1.2 cm.  The trachea and mainstem bronchi are patent.  There is a lesion or adjacent lesions in the right middle lobe along the right major fissure.  Small bulla or air pockets adjacent to this lesion.  The lesion has areas of high density which could represent blood products.  The mass or masses roughly measures 3.1 x 2.2 x 4.1 cm. There are linear densities at the left lung base suggestive for atelectasis or scarring.  Left lung is clear.  There are no suspicious bone lesions.  IMPRESSION: There is a lobulated lesion or adjacent lesions within the posterior right middle lobe.  Maximum size of this lesion measures up to 4.1 cm.  There is extensive mediastinal and right hilar lymphadenopathy concerning for metastatic disease.  These findings are suspicious for primary bronchogenic carcinoma. The lesion and lymphadenopathy can be further evaluated with a PET-CT.  Indeterminate 1.2 cm nodule in the right adrenal gland. Differential diagnosis would include an adrenal adenoma versus a small metastatic lesion.  These results will be called to the ordering clinician or representative by the Radiologist Assistant, and communication documented in the PACS Dashboard.  Original Report Authenticated By: Richarda Overlie, M.D.   Mr Laqueta Jean Wo Contrast  11/30/2011  *RADIOLOGY REPORT*  Clinical Data: 78 year old female with recent lung cancer diagnosis.  Dizziness and headaches.  MRI HEAD WITHOUT AND WITH CONTRAST  Technique:  Multiplanar, multiecho pulse sequences of the brain and surrounding structures were obtained according to standard protocol without and with intravenous contrast  Contrast: 15mL MULTIHANCE GADOBENATE DIMEGLUMINE 529 MG/ML IV SOLN  Comparison: None.  Findings: No abnormal enhancement identified.  No midline shift, mass effect, or evidence of mass lesion.  No restricted  diffusion to suggest acute infarction.  No ventriculomegaly. No acute intracranial hemorrhage identified. Negative pituitary and cervicomedullary junction.  Degenerative changes in the visualized cervical spine which otherwise is within normal limits. Major intracranial vascular flow voids are preserved.  Normal for age gray-white matter signal throughout the brain.  Nonspecific heterogeneous bone marrow signal, including some decreased T1 signal in the clivus.  No expansile or destructive osseous lesion is identified.  Grossly normal visualized internal auditory structures.  Mastoids are clear.  Minor maxillary sinus mucosal thickening.  Other paranasal sinuses are clear.  Postoperative changes to the globes.  Negative scalp soft tissues.  IMPRESSION: 1. No acute or metastatic intracranial abnormality. 2.  Nonspecific heterogeneous bone marrow signal, not convincing for metastatic disease to bone at this time.  Original Report Authenticated By: Harley Hallmark, M.D.   Nm Pet Image Initial (pi) Skull Base To Thigh  11/26/2011  *RADIOLOGY REPORT*  Clinical Data: Initial treatment strategy for right lung mass.  NUCLEAR MEDICINE PET SKULL BASE TO THIGH  Fasting Blood Glucose:  95  Technique:  16.5 mCi F-18 FDG was injected intravenously. CT data was obtained and used for attenuation correction and anatomic localization only.  (This was not acquired as a diagnostic CT examination.) Additional exam technical data entered on technologist worksheet.  Comparison:  Chest CT on 11/17/2011  Findings:  Neck: 7 mm hypermetabolic lymph node is seen in the left supraclavicular region, with maximum SUV of 8.3.  No other hypermetabolic lymphadenopathy identified.  Chest:  3.5 cm right middle lobe mass shows heterogeneous hypermetabolic activity, with maximum SUV of 8.3.  This is consistent with bronchogenic carcinoma.  Bulky right hilar and mediastinal lymphadenopathy is also seen with intense hypermetabolic activity, with maximum  SUV up to 16.3.  Abdomen/Pelvis:  A small 1.3 cm right adrenal nodule has mild hypermetabolic activity with maximum SUV of 4.6, highly suspicious for a right adrenal metastasis.  No other hypermetabolic masses or lymphadenopathy identified within the abdomen or pelvis.  [place]:  No focal hypermetabolic activity to suggest skeletal metastasis.  IMPRESSION:  1.  3.5 cm right middle lobe hypermetabolic mass, consistent with primary bronchogenic carcinoma. 2.  Metastatic lymphadenopathy in the right hilum, mediastinum, and left supraclavicular region. 3. 1.3 cm right adrenal metastasis.  Original Report Authenticated By: Danae Orleans, M.D.    ASSESSMENT: This is a very pleasant 78 years old white female recently diagnosed with stage IV non-small cell lung cancer involving the right middle lobe with mediastinal lymphadenopathy as well as right adrenal metastasis.   PLAN: I have a lengthy discussion with the patient and her family today about her disease stage, prognosis and treatment options.  #1 I would send her tumor tissue block to be tested for EGFR mutation as well as ALK gene translocation. #2 if the patient has evidence for EGFR mutation or ALK gene translocation she would be treated with oral target agent like Tarceva or Xalkori. #3 if the patient has no evidence for mutation, she would be considered for systemic chemotherapy with either carboplatin and Alimta versus carboplatin, Taxol and Avastin on a clinical trial according to the ECoG protocol 5508 followed by maintenance therapy. I discussed with the patient adverse effect of this treatment including but not limited to alopecia, myelosuppression, nausea or vomiting, peripheral neuropathy, liver or renal dysfunction. I will arrange for the patient to have a chemotherapy education class next week in addition to vitamin B12 injection in preparation for treatment with carboplatin and Alimta if she is not eligible for the clinical trial.  I would see  the patient back for followup visit in 2 weeks for evaluation and discussion of her treatment options in detail after the results of the genetic mutation become available. The patient and her family agreed to the current plan.  All questions were answered. The patient knows to call the clinic with any problems, questions or concerns. We can certainly see the patient much sooner if necessary.  Thank you so much for allowing me to participate in the care of ANWAR CRILL. I will continue to follow up the patient with you and assist in her care.  I spent 30 minutes counseling the patient face to face. The total time spent in the appointment was 60 minutes.   Meriah Shands K. 12/05/2011, 9:13 PM

## 2011-12-07 ENCOUNTER — Telehealth: Payer: Self-pay | Admitting: *Deleted

## 2011-12-07 ENCOUNTER — Other Ambulatory Visit: Payer: Self-pay | Admitting: Internal Medicine

## 2011-12-07 NOTE — Telephone Encounter (Signed)
Spoke with pt regarding appt's.  I stated that schedulers will call with appt time.  She verbalized understanding of this

## 2011-12-09 ENCOUNTER — Telehealth: Payer: Self-pay | Admitting: *Deleted

## 2011-12-09 ENCOUNTER — Other Ambulatory Visit: Payer: Self-pay | Admitting: *Deleted

## 2011-12-09 NOTE — Telephone Encounter (Signed)
Spoke with pt regarding appt.  She is aware of pending pathology then appt will be set up.

## 2011-12-11 ENCOUNTER — Other Ambulatory Visit: Payer: Self-pay | Admitting: Internal Medicine

## 2011-12-11 ENCOUNTER — Telehealth: Payer: Self-pay | Admitting: *Deleted

## 2011-12-11 ENCOUNTER — Telehealth: Payer: Self-pay | Admitting: Internal Medicine

## 2011-12-11 ENCOUNTER — Ambulatory Visit (HOSPITAL_BASED_OUTPATIENT_CLINIC_OR_DEPARTMENT_OTHER): Payer: Medicare Other

## 2011-12-11 ENCOUNTER — Other Ambulatory Visit: Payer: Self-pay | Admitting: *Deleted

## 2011-12-11 VITALS — BP 159/80 | HR 76 | Temp 97.1°F

## 2011-12-11 DIAGNOSIS — C3490 Malignant neoplasm of unspecified part of unspecified bronchus or lung: Secondary | ICD-10-CM

## 2011-12-11 DIAGNOSIS — Z5111 Encounter for antineoplastic chemotherapy: Secondary | ICD-10-CM

## 2011-12-11 DIAGNOSIS — C342 Malignant neoplasm of middle lobe, bronchus or lung: Secondary | ICD-10-CM

## 2011-12-11 MED ORDER — FOLIC ACID 1 MG PO TABS: 1.0000 mg | ORAL_TABLET | Freq: Every day | ORAL | Status: DC

## 2011-12-11 MED ORDER — CYANOCOBALAMIN 1000 MCG/ML IJ SOLN
1000.0000 ug | Freq: Once | INTRAMUSCULAR | Status: AC
  Administered 2011-12-11: 1000 ug via INTRAMUSCULAR

## 2011-12-11 MED ORDER — PROCHLORPERAZINE MALEATE 10 MG PO TABS: 10.0000 mg | ORAL_TABLET | Freq: Four times a day (QID) | ORAL | Status: DC | PRN

## 2011-12-11 MED ORDER — DEXAMETHASONE 4 MG PO TABS: 4.0000 mg | ORAL_TABLET | Freq: Two times a day (BID) | ORAL | Status: DC

## 2011-12-11 MED ORDER — DEXAMETHASONE 4 MG PO TABS: 4.0000 mg | ORAL_TABLET | Freq: Two times a day (BID) | ORAL | Status: AC

## 2011-12-11 NOTE — Telephone Encounter (Signed)
called pt with appts

## 2011-12-11 NOTE — Telephone Encounter (Signed)
Called pathology this am.  Mutation is back.  Called pt to schedule an appt

## 2011-12-11 NOTE — Telephone Encounter (Signed)
Spoke with daughter regarding appt

## 2011-12-11 NOTE — Telephone Encounter (Signed)
Left message on phone regarding appt time

## 2011-12-11 NOTE — Telephone Encounter (Signed)
Spoke with pt regarding medication ordered per Dr. Arbutus Ped due to chemotherapy starting next week.  I gave instructions on medication and pt verbalized understand.  I clarified pharmacy and she stated [facility] on [address] was the correct pharmacy

## 2011-12-11 NOTE — Telephone Encounter (Signed)
Pt called and was confused about medications.  I clarified questions.

## 2011-12-14 ENCOUNTER — Telehealth: Payer: Self-pay | Admitting: *Deleted

## 2011-12-14 NOTE — Telephone Encounter (Signed)
Spoke with pt regarding medication question that

## 2011-12-15 ENCOUNTER — Encounter: Payer: Self-pay | Admitting: *Deleted

## 2011-12-15 ENCOUNTER — Other Ambulatory Visit: Payer: Medicare Other

## 2011-12-15 ENCOUNTER — Ambulatory Visit: Payer: Medicare Other

## 2011-12-16 ENCOUNTER — Other Ambulatory Visit: Payer: Self-pay | Admitting: Internal Medicine

## 2011-12-16 ENCOUNTER — Ambulatory Visit (HOSPITAL_BASED_OUTPATIENT_CLINIC_OR_DEPARTMENT_OTHER): Payer: Medicare Other

## 2011-12-16 VITALS — BP 146/82 | HR 76 | Temp 98.2°F | Resp 18

## 2011-12-16 DIAGNOSIS — Z5111 Encounter for antineoplastic chemotherapy: Secondary | ICD-10-CM

## 2011-12-16 DIAGNOSIS — C3490 Malignant neoplasm of unspecified part of unspecified bronchus or lung: Secondary | ICD-10-CM

## 2011-12-16 DIAGNOSIS — C342 Malignant neoplasm of middle lobe, bronchus or lung: Secondary | ICD-10-CM

## 2011-12-16 MED ORDER — SODIUM CHLORIDE 0.9 % IV SOLN
Freq: Once | INTRAVENOUS | Status: AC
  Administered 2011-12-16: 20 mL via INTRAVENOUS

## 2011-12-16 MED ORDER — SODIUM CHLORIDE 0.9 % IV SOLN
426.0000 mg | Freq: Once | INTRAVENOUS | Status: AC
  Administered 2011-12-16: 430 mg via INTRAVENOUS
  Filled 2011-12-16: qty 43

## 2011-12-16 MED ORDER — DEXAMETHASONE SODIUM PHOSPHATE 4 MG/ML IJ SOLN
20.0000 mg | Freq: Once | INTRAMUSCULAR | Status: AC
  Administered 2011-12-16: 20 mg via INTRAVENOUS

## 2011-12-16 MED ORDER — SODIUM CHLORIDE 0.9 % IV SOLN
500.0000 mg/m2 | Freq: Once | INTRAVENOUS | Status: AC
  Administered 2011-12-16: 900 mg via INTRAVENOUS
  Filled 2011-12-16: qty 36

## 2011-12-16 MED ORDER — ONDANSETRON 16 MG/50ML IVPB (CHCC)
16.0000 mg | Freq: Once | INTRAVENOUS | Status: AC
  Administered 2011-12-16: 16 mg via INTRAVENOUS

## 2011-12-17 ENCOUNTER — Telehealth: Payer: Self-pay | Admitting: *Deleted

## 2011-12-17 ENCOUNTER — Ambulatory Visit (HOSPITAL_COMMUNITY): Payer: Medicare Other | Admitting: Oncology

## 2011-12-17 NOTE — Telephone Encounter (Signed)
Message left requesting a return call for f/u.  Awaiting a return call.

## 2011-12-17 NOTE — Telephone Encounter (Signed)
Patient returned f/u call.  Reports feeling well.  Drinking only water and eating well.  Denies n/v or any side effects.  Informed her she can drink other beverages in addition to the water she is drinking.  Asked that she avoid excesive cafeinated beverages.  Denies questions at this time.

## 2011-12-23 ENCOUNTER — Other Ambulatory Visit (HOSPITAL_BASED_OUTPATIENT_CLINIC_OR_DEPARTMENT_OTHER): Payer: Medicare Other | Admitting: Lab

## 2011-12-23 ENCOUNTER — Ambulatory Visit (HOSPITAL_BASED_OUTPATIENT_CLINIC_OR_DEPARTMENT_OTHER): Payer: Medicare Other | Admitting: Internal Medicine

## 2011-12-23 ENCOUNTER — Telehealth: Payer: Self-pay | Admitting: *Deleted

## 2011-12-23 VITALS — BP 153/72 | HR 68 | Temp 96.9°F | Resp 18 | Ht 61.0 in | Wt 158.1 lb

## 2011-12-23 DIAGNOSIS — R110 Nausea: Secondary | ICD-10-CM

## 2011-12-23 DIAGNOSIS — C3490 Malignant neoplasm of unspecified part of unspecified bronchus or lung: Secondary | ICD-10-CM

## 2011-12-23 DIAGNOSIS — C342 Malignant neoplasm of middle lobe, bronchus or lung: Secondary | ICD-10-CM

## 2011-12-23 DIAGNOSIS — C778 Secondary and unspecified malignant neoplasm of lymph nodes of multiple regions: Secondary | ICD-10-CM

## 2011-12-23 DIAGNOSIS — D709 Neutropenia, unspecified: Secondary | ICD-10-CM

## 2011-12-23 LAB — CBC WITH DIFFERENTIAL/PLATELET
Basophils Absolute: 0 10*3/uL (ref 0.0–0.1)
Eosinophils Absolute: 0.1 10*3/uL (ref 0.0–0.5)
HGB: 13 g/dL (ref 11.6–15.9)
MONO#: 0.2 10*3/uL (ref 0.1–0.9)
MONO%: 8.8 % (ref 0.0–14.0)
NEUT#: 0.7 10*3/uL — ABNORMAL LOW (ref 1.5–6.5)
RBC: 4.03 10*6/uL (ref 3.70–5.45)
RDW: 12.6 % (ref 11.2–14.5)
WBC: 2.2 10*3/uL — ABNORMAL LOW (ref 3.9–10.3)
lymph#: 1.2 10*3/uL (ref 0.9–3.3)
nRBC: 0 % (ref 0–0)

## 2011-12-23 LAB — COMPREHENSIVE METABOLIC PANEL (CC13)
ALT: 21 U/L (ref 0–55)
Albumin: 3.3 g/dL — ABNORMAL LOW (ref 3.5–5.0)
CO2: 23 mEq/L (ref 22–29)
Glucose: 108 mg/dl — ABNORMAL HIGH (ref 70–99)
Potassium: 4 mEq/L (ref 3.5–5.1)
Sodium: 136 mEq/L (ref 136–145)
Total Bilirubin: 0.9 mg/dL (ref 0.20–1.20)
Total Protein: 6.7 g/dL (ref 6.4–8.3)

## 2011-12-23 NOTE — Telephone Encounter (Signed)
Per staff message and POF I have scheduled appts. JWM  

## 2011-12-23 NOTE — Telephone Encounter (Signed)
[name] email to set up patient treatments labs all ready on the patient's calendar

## 2011-12-23 NOTE — Progress Notes (Signed)
[facility] [phone]   [phone]  OFFICE PROGRESS NOTE  [name], MD [address] [place] [address]  DIAGNOSIS: Metastatic non-small cell lung cancer, adenocarcinoma with negative EGFR mutation and negative ALK gene translocation diagnosed in August of 2013  PRIOR THERAPY: None.  CURRENT THERAPY: Systemic chemotherapy with carboplatin for AUC of 5 and Alimta 500 mg/M2 every 3 weeks. The patient is status post 1 cycle.   INTERVAL HISTORY: ADYLENE DLUGOSZ 78 y.o. female returns to the clinic today for followup visit accompanied by her daughter and two friends. The patient tolerated the first dose of her systemic chemotherapy with carboplatin and Alimta fairly well except for mild nausea that continued for a few days after her chemotherapy. She tried Compazine initially with no improvement and the patient was placed on Zofran ODT with improvement in her condition. She denied having any significant fever or chills. She has no vomiting, no significant constipation or diarrhea. She has no significant chest pain, shortness breath, cough or hemoptysis.  MEDICAL HISTORY: Past Medical History  Diagnosis Date  . Cervical spondylosis   . Renal cyst     simple bilateral 2008  . Diverticulosis   . Vertigo, benign positional   . Hyperlipidemia   . Aortic sclerosis     by exam,  . Erosive osteoarthritis of hand   . History of calcium pyrophosphate deposition disease (CPPD)   . Gout     vs pseudo gout flares in past  . Mediastinal adenopathy   . Tobacco use   . COPD, mild   . Glaucoma   . Hypertension   . Osteopenia   . Hiatal hernia   . Hemorrhoids   . H/O urinary incontinence   . Childhood asthma   . Bronchitis   . TIA (transient ischemic attack)   . Claustrophobia   . Dizziness   . Bleeding from the nose     hx of severe nose bleeds-last time was 2 yrs ago, slight bleed last week    ALLERGIES:  is allergic  to shellfish allergy; septra ds; statins; tape; and wellbutrin.  MEDICATIONS:  Current Outpatient Prescriptions  Medication Sig Dispense Refill  . ALPRAZolam (XANAX) 0.25 MG tablet Take 1 tablet (0.25 mg total) by mouth at bedtime as needed for sleep (TAKE ONE TABLET 30 MINUTES BEFORE MRI...MAY REPEAT X 1 IF NCESSARY).  2 tablet  0  . aspirin 81 MG tablet Take 81 mg by mouth daily.      . B Complex-C (B-COMPLEX WITH VITAMIN C) tablet Take 1 tablet by mouth daily.      . Cholecalciferol (VITAMIN D) 2000 UNITS tablet Take 2,000 Units by mouth daily.      . colchicine 0.6 MG tablet Take 0.6 mg by mouth daily as needed. For gout flare      . diclofenac sodium (VOLTAREN) 1 % GEL Apply 1 application topically 4 (four) times daily.       Marland Kitchen diltiazem 2 % GEL Apply 1 application topically 4 (four) times daily.       Marland Kitchen estrogens, conjugated, (PREMARIN) 0.625 MG tablet Take 0.625 mg by mouth daily. Take daily for 21 days then do not take for 7 days.      . fish oil-omega-3 fatty acids 1000 MG capsule Take 1 g by mouth 2 (two) times daily.       . folic acid (FOLVITE) 1 MG tablet Take 1 tablet (1 mg total)  by mouth daily.  30 tablet  4  . Multiple Vitamin (MULTIVITAMIN WITH MINERALS) TABS Take 1 tablet by mouth daily.      . nebivolol (BYSTOLIC) 5 MG tablet Take 5 mg by mouth daily with supper.       Marland Kitchen OVER THE COUNTER MEDICATION Take 375 mg by mouth 2 (two) times daily. "Mega Beta Sitosterol 375 mg"      . OVER THE COUNTER MEDICATION Take 3 capsules by mouth daily as needed. "Tart Cherry Ultra"; for gout flare      . prochlorperazine (COMPAZINE) 10 MG tablet Take 1 tablet (10 mg total) by mouth every 6 (six) hours as needed.  60 tablet  0  . timolol (TIMOPTIC-XR) 0.5 % ophthalmic gel-forming Place 1 drop into the left eye daily.         SURGICAL HISTORY:  Past Surgical History  Procedure Date  . Cystoscopy, transurethral collagen injection therapy   . Left total knee arthroplasty   . Appendectomy     . Hemorrhoid surgery   . Cataract surgery x 2   . Eye surgery   . Tonsillectomy   . Abdominal hysterectomy   . Colonoscopy     REVIEW OF SYSTEMS:  A comprehensive review of systems was negative except for: Constitutional: positive for fatigue Gastrointestinal: positive for nausea   PHYSICAL EXAMINATION: General appearance: alert, cooperative and no distress Head: Normocephalic, without obvious abnormality, atraumatic Neck: no adenopathy Lymph nodes: Cervical, supraclavicular, and axillary nodes normal. Resp: clear to auscultation bilaterally Back: negative, symmetric, no curvature. ROM normal. No CVA tenderness. Cardio: regular rate and rhythm, S1, S2 normal, no murmur, click, rub or gallop GI: soft, non-tender; bowel sounds normal; no masses,  no organomegaly Extremities: extremities normal, atraumatic, no cyanosis or edema Neurologic: Alert and oriented X 3, normal strength and tone. Normal symmetric reflexes. Normal coordination and gait  ECOG PERFORMANCE STATUS: 1 - Symptomatic but completely ambulatory  Blood pressure 153/72, pulse 68, temperature 96.9 F (36.1 C), temperature source Oral, resp. rate 18, height 5\' 1"  (1.549 m), weight 158 lb 1.6 oz (71.714 kg).  LABORATORY DATA: Lab Results  Component Value Date   WBC 2.2* 12/23/2011   HGB 13.0 12/23/2011   HCT 37.0 12/23/2011   MCV 91.8 12/23/2011   PLT 130* 12/23/2011      Chemistry      Component Value Date/Time   NA 136 12/23/2011 1111   NA 140 11/26/2011 1041   K 4.0 12/23/2011 1111   K 4.3 11/26/2011 1041   CL 102 12/23/2011 1111   CL 104 11/26/2011 1041   CO2 23 12/23/2011 1111   CO2 24 11/26/2011 1041   BUN 23.0 12/23/2011 1111   BUN 18 11/26/2011 1041   CREATININE 1.1 12/23/2011 1111   CREATININE 0.85 11/26/2011 1041      Component Value Date/Time   CALCIUM 9.7 11/26/2011 1041   ALKPHOS 92 12/23/2011 1111   ALKPHOS 82 11/26/2011 1041   AST 29 12/23/2011 1111   AST 25 11/26/2011 1041   ALT 21 12/23/2011 1111   ALT 15 11/26/2011 1041    BILITOT 0.90 12/23/2011 1111   BILITOT 0.3 11/26/2011 1041       RADIOGRAPHIC STUDIES: Dg Chest 2 View Within Previous 72 Hours.  Films Obtained On Friday Are Acceptable For Monday And Tuesday Cases  11/26/2011  *RADIOLOGY REPORT*  Clinical Data: Preop  CHEST - 2 VIEW  Comparison: C t chest 11/17/2011  Findings: Cardiomediastinal silhouette is stable.  Again noted  mediastinal and right hilar / infrahilar adenopathy.  A nodular mass in the right lower lobe measures 3.7 cm.  No destructive bony lesions are identified.  No acute infiltrate or pulmonary edema.  IMPRESSION:  Again noted mediastinal and right hilar / infrahilar adenopathy. A nodular mass in the right lower lobe measures 3.7 cm.  No destructive bony lesions are identified.  No acute infiltrate or pulmonary edema.  Original Report Authenticated By: Natasha Mead, M.D.   Mr Laqueta Jean Wo Contrast  11/30/2011  *RADIOLOGY REPORT*  Clinical Data: 78 year old female with recent lung cancer diagnosis.  Dizziness and headaches.  MRI HEAD WITHOUT AND WITH CONTRAST  Technique:  Multiplanar, multiecho pulse sequences of the brain and surrounding structures were obtained according to standard protocol without and with intravenous contrast  Contrast: 15mL MULTIHANCE GADOBENATE DIMEGLUMINE 529 MG/ML IV SOLN  Comparison: None.  Findings: No abnormal enhancement identified.  No midline shift, mass effect, or evidence of mass lesion.  No restricted diffusion to suggest acute infarction.  No ventriculomegaly. No acute intracranial hemorrhage identified. Negative pituitary and cervicomedullary junction.  Degenerative changes in the visualized cervical spine which otherwise is within normal limits. Major intracranial vascular flow voids are preserved.  Normal for age gray-white matter signal throughout the brain.  Nonspecific heterogeneous bone marrow signal, including some decreased T1 signal in the clivus.  No expansile or destructive osseous lesion is identified.  Grossly  normal visualized internal auditory structures.  Mastoids are clear.  Minor maxillary sinus mucosal thickening.  Other paranasal sinuses are clear.  Postoperative changes to the globes.  Negative scalp soft tissues.  IMPRESSION: 1. No acute or metastatic intracranial abnormality. 2.  Nonspecific heterogeneous bone marrow signal, not convincing for metastatic disease to bone at this time.  Original Report Authenticated By: Harley Hallmark, M.D.   Nm Pet Image Initial (pi) Skull Base To Thigh  11/26/2011  *RADIOLOGY REPORT*  Clinical Data: Initial treatment strategy for right lung mass.  NUCLEAR MEDICINE PET SKULL BASE TO THIGH  Fasting Blood Glucose:  95  Technique:  16.5 mCi F-18 FDG was injected intravenously. CT data was obtained and used for attenuation correction and anatomic localization only.  (This was not acquired as a diagnostic CT examination.) Additional exam technical data entered on technologist worksheet.  Comparison:  Chest CT on 11/17/2011  Findings:  Neck: 7 mm hypermetabolic lymph node is seen in the left supraclavicular region, with maximum SUV of 8.3.  No other hypermetabolic lymphadenopathy identified.  Chest:  3.5 cm right middle lobe mass shows heterogeneous hypermetabolic activity, with maximum SUV of 8.3.  This is consistent with bronchogenic carcinoma.  Bulky right hilar and mediastinal lymphadenopathy is also seen with intense hypermetabolic activity, with maximum SUV up to 16.3.  Abdomen/Pelvis:  A small 1.3 cm right adrenal nodule has mild hypermetabolic activity with maximum SUV of 4.6, highly suspicious for a right adrenal metastasis.  No other hypermetabolic masses or lymphadenopathy identified within the abdomen or pelvis.  [place]:  No focal hypermetabolic activity to suggest skeletal metastasis.  IMPRESSION:  1.  3.5 cm right middle lobe hypermetabolic mass, consistent with primary bronchogenic carcinoma. 2.  Metastatic lymphadenopathy in the right hilum, mediastinum, and left  supraclavicular region. 3. 1.3 cm right adrenal metastasis.  Original Report Authenticated By: Danae Orleans, M.D.    ASSESSMENT: This is a very pleasant 78 years old white female with metastatic non-small cell lung cancer, adenocarcinoma. The patient is currently undergoing systemic chemotherapy with carboplatin and Alimta status post  1 cycle. She is tolerating her treatment fairly well except for delayed nausea 2-3 days after her chemotherapy responding well to Zofran ODT. The patient also has mild neutropenia today.  PLAN: I discussed the lab result with the patient and her family. I advised her with neutropenic precautions. She was also advised to call immediately if she has any fever or chills . We will continue her systemic chemotherapy with carboplatin and Alimta but I would reduce her dose of starting from cycle #2 to carboplatin for AUC of 4 and Alimta 400 mg/M2 every 3 weeks. She was also advised to take Zofran on as-needed basis for her nausea. The patient would come back for followup visit in 2 weeks with the start of cycle #2.  All questions were answered. The patient knows to call the clinic with any problems, questions or concerns. We can certainly see the patient much sooner if necessary.  I spent 20 minutes counseling the patient face to face. The total time spent in the appointment was 30 minutes.

## 2011-12-23 NOTE — Patient Instructions (Signed)
You have low white blood count. Neutropenic precautions was given. Avoid sick people and call immediately if you have any fever or chills. Followup in 2 weeks

## 2011-12-28 ENCOUNTER — Telehealth: Payer: Self-pay | Admitting: Medical Oncology

## 2011-12-28 ENCOUNTER — Other Ambulatory Visit: Payer: Self-pay | Admitting: Medical Oncology

## 2011-12-28 DIAGNOSIS — C3490 Malignant neoplasm of unspecified part of unspecified bronchus or lung: Secondary | ICD-10-CM

## 2011-12-28 NOTE — Telephone Encounter (Signed)
Confirmed appts . 

## 2011-12-29 ENCOUNTER — Telehealth: Payer: Self-pay | Admitting: Internal Medicine

## 2011-12-29 NOTE — Telephone Encounter (Signed)
pt lm to call 9/9,ret. her call 9/10 and lm for her to c/b  aom

## 2011-12-30 ENCOUNTER — Telehealth: Payer: Self-pay | Admitting: Medical Oncology

## 2011-12-30 ENCOUNTER — Telehealth: Payer: Self-pay | Admitting: Internal Medicine

## 2011-12-30 ENCOUNTER — Other Ambulatory Visit (HOSPITAL_BASED_OUTPATIENT_CLINIC_OR_DEPARTMENT_OTHER): Payer: Medicare Other | Admitting: Lab

## 2011-12-30 DIAGNOSIS — C3490 Malignant neoplasm of unspecified part of unspecified bronchus or lung: Secondary | ICD-10-CM

## 2011-12-30 LAB — CBC WITH DIFFERENTIAL/PLATELET
BASO%: 0 % (ref 0.0–2.0)
LYMPH%: 34.8 % (ref 14.0–49.7)
MCHC: 33.5 g/dL (ref 31.5–36.0)
MCV: 95.3 fL (ref 79.5–101.0)
MONO#: 0.5 10*3/uL (ref 0.1–0.9)
MONO%: 18.5 % — ABNORMAL HIGH (ref 0.0–14.0)
NEUT#: 1.3 10*3/uL — ABNORMAL LOW (ref 1.5–6.5)
Platelets: 187 10*3/uL (ref 145–400)
RBC: 3.6 10*6/uL — ABNORMAL LOW (ref 3.70–5.45)
RDW: 13 % (ref 11.2–14.5)
WBC: 2.7 10*3/uL — ABNORMAL LOW (ref 3.9–10.3)

## 2011-12-30 LAB — COMPREHENSIVE METABOLIC PANEL (CC13)
ALT: 16 U/L (ref 0–55)
Albumin: 3.3 g/dL — ABNORMAL LOW (ref 3.5–5.0)
Alkaline Phosphatase: 98 U/L (ref 40–150)
Potassium: 4.1 mEq/L (ref 3.5–5.1)
Sodium: 141 mEq/L (ref 136–145)
Total Bilirubin: 0.7 mg/dL (ref 0.20–1.20)
Total Protein: 6.6 g/dL (ref 6.4–8.3)

## 2011-12-30 NOTE — Telephone Encounter (Signed)
gve the pt her sept/oct 2013 appt calendar

## 2011-12-30 NOTE — Telephone Encounter (Signed)
Reviewed labs with pt and told her she does not need injection.

## 2012-01-06 ENCOUNTER — Ambulatory Visit (HOSPITAL_BASED_OUTPATIENT_CLINIC_OR_DEPARTMENT_OTHER): Payer: Medicare Other | Admitting: Physician Assistant

## 2012-01-06 ENCOUNTER — Ambulatory Visit (HOSPITAL_BASED_OUTPATIENT_CLINIC_OR_DEPARTMENT_OTHER): Payer: Medicare Other

## 2012-01-06 ENCOUNTER — Telehealth: Payer: Self-pay | Admitting: Internal Medicine

## 2012-01-06 ENCOUNTER — Telehealth: Payer: Self-pay | Admitting: Medical Oncology

## 2012-01-06 ENCOUNTER — Other Ambulatory Visit (HOSPITAL_BASED_OUTPATIENT_CLINIC_OR_DEPARTMENT_OTHER): Payer: Medicare Other | Admitting: Lab

## 2012-01-06 VITALS — BP 167/81 | HR 75

## 2012-01-06 VITALS — BP 175/77 | HR 78 | Temp 96.1°F | Resp 20 | Ht 61.0 in | Wt 157.5 lb

## 2012-01-06 DIAGNOSIS — C7970 Secondary malignant neoplasm of unspecified adrenal gland: Secondary | ICD-10-CM

## 2012-01-06 DIAGNOSIS — Z5111 Encounter for antineoplastic chemotherapy: Secondary | ICD-10-CM

## 2012-01-06 DIAGNOSIS — R110 Nausea: Secondary | ICD-10-CM

## 2012-01-06 DIAGNOSIS — C3490 Malignant neoplasm of unspecified part of unspecified bronchus or lung: Secondary | ICD-10-CM

## 2012-01-06 DIAGNOSIS — C342 Malignant neoplasm of middle lobe, bronchus or lung: Secondary | ICD-10-CM

## 2012-01-06 DIAGNOSIS — R42 Dizziness and giddiness: Secondary | ICD-10-CM

## 2012-01-06 LAB — CBC WITH DIFFERENTIAL/PLATELET
Basophils Absolute: 0 10*3/uL (ref 0.0–0.1)
Eosinophils Absolute: 0 10*3/uL (ref 0.0–0.5)
HCT: 37.1 % (ref 34.8–46.6)
LYMPH%: 16 % (ref 14.0–49.7)
MCV: 96.4 fL (ref 79.5–101.0)
MONO%: 4.5 % (ref 0.0–14.0)
NEUT#: 3.3 10*3/uL (ref 1.5–6.5)
NEUT%: 79.1 % — ABNORMAL HIGH (ref 38.4–76.8)
Platelets: 323 10*3/uL (ref 145–400)
RBC: 3.85 10*6/uL (ref 3.70–5.45)

## 2012-01-06 LAB — COMPREHENSIVE METABOLIC PANEL (CC13)
BUN: 16 mg/dL (ref 7.0–26.0)
CO2: 22 mEq/L (ref 22–29)
Calcium: 10.5 mg/dL — ABNORMAL HIGH (ref 8.4–10.4)
Chloride: 105 mEq/L (ref 98–107)
Creatinine: 0.9 mg/dL (ref 0.6–1.1)
Glucose: 141 mg/dl — ABNORMAL HIGH (ref 70–99)
Total Bilirubin: 0.3 mg/dL (ref 0.20–1.20)

## 2012-01-06 MED ORDER — SODIUM CHLORIDE 0.9 % IV SOLN
445.0000 mg | Freq: Once | INTRAVENOUS | Status: AC
  Administered 2012-01-06: 450 mg via INTRAVENOUS
  Filled 2012-01-06: qty 45

## 2012-01-06 MED ORDER — SODIUM CHLORIDE 0.9 % IV SOLN
Freq: Once | INTRAVENOUS | Status: AC
  Administered 2012-01-06: 16:00:00 via INTRAVENOUS

## 2012-01-06 MED ORDER — ONDANSETRON 16 MG/50ML IVPB (CHCC)
16.0000 mg | Freq: Once | INTRAVENOUS | Status: AC
  Administered 2012-01-06: 16 mg via INTRAVENOUS

## 2012-01-06 MED ORDER — SODIUM CHLORIDE 0.9 % IV SOLN
500.0000 mg/m2 | Freq: Once | INTRAVENOUS | Status: AC
  Administered 2012-01-06: 900 mg via INTRAVENOUS
  Filled 2012-01-06: qty 36

## 2012-01-06 MED ORDER — DEXAMETHASONE SODIUM PHOSPHATE 4 MG/ML IJ SOLN
20.0000 mg | Freq: Once | INTRAMUSCULAR | Status: AC
  Administered 2012-01-06: 20 mg via INTRAVENOUS

## 2012-01-06 NOTE — Telephone Encounter (Signed)
Pt forgot to take decadron premed last night . Per Dr Donnald Garre take it take it today and  Tomorrow.

## 2012-01-06 NOTE — Patient Instructions (Signed)
[facility] Discharge Instructions for Patients Receiving Chemotherapy  Today you received the following chemotherapy agents Alimta and Carboplatin.  To help prevent nausea and vomiting after your treatment, we encourage you to take your nausea medication as prescribed.   If you develop nausea and vomiting that is not controlled by your nausea medication, call the clinic. If it is after clinic hours your family physician or the after hours number for the clinic or go to the Emergency Department.   BELOW ARE SYMPTOMS THAT SHOULD BE REPORTED IMMEDIATELY:  *FEVER GREATER THAN 100.5 F  *CHILLS WITH OR WITHOUT FEVER  NAUSEA AND VOMITING THAT IS NOT CONTROLLED WITH YOUR NAUSEA MEDICATION  *UNUSUAL SHORTNESS OF BREATH  *UNUSUAL BRUISING OR BLEEDING  TENDERNESS IN MOUTH AND THROAT WITH OR WITHOUT PRESENCE OF ULCERS  *URINARY PROBLEMS  *BOWEL PROBLEMS  UNUSUAL RASH Items with * indicate a potential emergency and should be followed up as soon as possible.  One of the nurses will contact you 24 hours after your treatment. Please let the nurse know about any problems that you may have experienced. Feel free to call the clinic you have any questions or concerns. The clinic phone number is [phone].   I have been informed and understand all the instructions given to me. I know to contact the clinic, my physician, or go to the Emergency Department if any problems should occur. I do not have any questions at this time, but understand that I may call the clinic during office hours   should I have any questions or need assistance in obtaining follow up care.    __________________________________________  _____________  __________ Signature of Patient or Authorized Representative            Date                   Time    __________________________________________ Nurse's Signature    

## 2012-01-06 NOTE — Patient Instructions (Addendum)
Continue weekly labs Follow up in 3 weeks prior to your next cycle of chemotherapy Keep your scheduled appointment with your primary care doctor, be sure to let him know about your elevated blood pressure readings and your dizziness with position change

## 2012-01-06 NOTE — Telephone Encounter (Signed)
Printed and gv appt to pt..e-mailed Michelle to alter tx time.Marland KitchenMarland Kitchen

## 2012-01-11 NOTE — Progress Notes (Signed)
[facility] [phone]   [phone]  OFFICE PROGRESS NOTE  [name], MD [address] [place] [address]  DIAGNOSIS: Metastatic non-small cell lung cancer, adenocarcinoma with negative EGFR mutation and negative ALK gene translocation diagnosed in August of 2013  PRIOR THERAPY: None.  CURRENT THERAPY: Systemic chemotherapy with carboplatin for AUC of 5 and Alimta 500 mg/M2 every 3 weeks. The patient is status post 1 cycle.   INTERVAL HISTORY: [name] 78 y.o. female returns to the clinic today for followup visit accompanied by her daughter [name]. The patient tolerated the first dose of her systemic chemotherapy with carboplatin and Alimta fairly well except for mild nausea that continued for a few days after her chemotherapy. She tried Compazine initially with no improvement and the patient was placed on Zofran ODT with improvement in her condition. She presents to proceed with cycle #2 of her systemic chemotherapy with carboplatin and Alimta. She reports intermittent small red bumps coming up on her face. She denied any itching or other symptomatology associated with this rash. Her eyes have been burning in her nose has been running in the past few days. The symptoms may be associated with seasonal allergy symptoms. She denied having any significant fever or chills. She has no vomiting, no significant constipation or diarrhea. She has no significant chest pain, shortness breath, cough or hemoptysis.  MEDICAL HISTORY: Past Medical History  Diagnosis Date  . Cervical spondylosis   . Renal cyst     simple bilateral 2008  . Diverticulosis   . Vertigo, benign positional   . Hyperlipidemia   . Aortic sclerosis     by exam,  . Erosive osteoarthritis of hand   . History of calcium pyrophosphate deposition disease (CPPD)   . Gout     vs pseudo gout flares in past  . Mediastinal adenopathy   . Tobacco use   . COPD,  mild   . Glaucoma   . Hypertension   . Osteopenia   . Hiatal hernia   . Hemorrhoids   . H/O urinary incontinence   . Childhood asthma   . Bronchitis   . TIA (transient ischemic attack)   . Claustrophobia   . Dizziness   . Bleeding from the nose     hx of severe nose bleeds-last time was 2 yrs ago, slight bleed last week    ALLERGIES:  is allergic to shellfish allergy; septra ds; statins; tape; and wellbutrin.  MEDICATIONS:  Current Outpatient Prescriptions  Medication Sig Dispense Refill  . ADVAIR DISKUS 250-50 MCG/DOSE AEPB       . ALPRAZolam (XANAX) 0.25 MG tablet       . aspirin 81 MG tablet Take 81 mg by mouth daily.      . B Complex-C (B-COMPLEX WITH VITAMIN C) tablet Take 1 tablet by mouth daily.      . Cholecalciferol (VITAMIN D) 2000 UNITS tablet Take 2,000 Units by mouth daily.      . colchicine 0.6 MG tablet Take 0.6 mg by mouth daily as needed. For gout flare      . dexamethasone (DECADRON) 4 MG tablet       . diclofenac sodium (VOLTAREN) 1 % GEL Apply 1 application topically 4 (four) times daily.       Marland Kitchen diltiazem 2 % GEL Apply 1 application topically 4 (four) times daily.       Marland Kitchen estrogens, conjugated, (PREMARIN) 0.625 MG tablet Take  0.625 mg by mouth daily. Take daily for 21 days then do not take for 7 days.      . fish oil-omega-3 fatty acids 1000 MG capsule Take 1 g by mouth 2 (two) times daily.       . folic acid (FOLVITE) 1 MG tablet Take 1 tablet (1 mg total) by mouth daily.  30 tablet  4  . methocarbamol (ROBAXIN) 500 MG tablet       . Multiple Vitamin (MULTIVITAMIN WITH MINERALS) TABS Take 1 tablet by mouth daily.      . nebivolol (BYSTOLIC) 5 MG tablet Take 5 mg by mouth daily with supper.       . ondansetron (ZOFRAN-ODT) 8 MG disintegrating tablet       . OVER THE COUNTER MEDICATION Take 375 mg by mouth 2 (two) times daily. "Mega Beta Sitosterol 375 mg"      . OVER THE COUNTER MEDICATION Take 3 capsules by mouth daily as needed. "Tart Cherry Ultra"; for  gout flare      . prochlorperazine (COMPAZINE) 10 MG tablet Take 1 tablet (10 mg total) by mouth every 6 (six) hours as needed.  60 tablet  0  . timolol (TIMOPTIC-XR) 0.5 % ophthalmic gel-forming Place 1 drop into the left eye daily.         SURGICAL HISTORY:  Past Surgical History  Procedure Date  . Cystoscopy, transurethral collagen injection therapy   . Left total knee arthroplasty   . Appendectomy   . Hemorrhoid surgery   . Cataract surgery x 2   . Eye surgery   . Tonsillectomy   . Abdominal hysterectomy   . Colonoscopy     REVIEW OF SYSTEMS:  Pertinent items are noted in HPI.   PHYSICAL EXAMINATION: General appearance: alert, cooperative and no distress Head: Normocephalic, without obvious abnormality, atraumatic Neck: no adenopathy Lymph nodes: Cervical, supraclavicular, and axillary nodes normal. Resp: clear to auscultation bilaterally Back: negative, symmetric, no curvature. ROM normal. No CVA tenderness. Cardio: regular rate and rhythm, S1, S2 normal, no murmur, click, rub or gallop GI: soft, non-tender; bowel sounds normal; no masses,  no organomegaly Extremities: extremities normal, atraumatic, no cyanosis or edema Neurologic: Alert and oriented X 3, normal strength and tone. Normal symmetric reflexes. Normal coordination and gait  ECOG PERFORMANCE STATUS: 1 - Symptomatic but completely ambulatory  Blood pressure 175/77, pulse 78, temperature 96.1 F (35.6 C), temperature source Oral, resp. rate 20, height 5\' 1"  (1.549 m), weight 157 lb 8 oz (71.442 kg).  LABORATORY DATA: Lab Results  Component Value Date   WBC 4.2 01/06/2012   HGB 12.4 01/06/2012   HCT 37.1 01/06/2012   MCV 96.4 01/06/2012   PLT 323 01/06/2012      Chemistry      Component Value Date/Time   NA 141 01/06/2012 1418   NA 140 11/26/2011 1041   K 4.1 01/06/2012 1418   K 4.3 11/26/2011 1041   CL 105 01/06/2012 1418   CL 104 11/26/2011 1041   CO2 22 01/06/2012 1418   CO2 24 11/26/2011 1041   BUN 16.0  01/06/2012 1418   BUN 18 11/26/2011 1041   CREATININE 0.9 01/06/2012 1418   CREATININE 0.85 11/26/2011 1041      Component Value Date/Time   CALCIUM 10.5* 01/06/2012 1418   CALCIUM 9.7 11/26/2011 1041   ALKPHOS 109 01/06/2012 1418   ALKPHOS 82 11/26/2011 1041   AST 29 01/06/2012 1418   AST 25 11/26/2011 1041   ALT 21  01/06/2012 1418   ALT 15 11/26/2011 1041   BILITOT 0.30 01/06/2012 1418   BILITOT 0.3 11/26/2011 1041       RADIOGRAPHIC STUDIES: Dg Chest 2 View Within Previous 72 Hours.  Films Obtained On Friday Are Acceptable For Monday And Tuesday Cases  11/26/2011  *RADIOLOGY REPORT*  Clinical Data: Preop  CHEST - 2 VIEW  Comparison: C t chest 11/17/2011  Findings: Cardiomediastinal silhouette is stable.  Again noted mediastinal and right hilar / infrahilar adenopathy.  A nodular mass in the right lower lobe measures 3.7 cm.  No destructive bony lesions are identified.  No acute infiltrate or pulmonary edema.  IMPRESSION:  Again noted mediastinal and right hilar / infrahilar adenopathy. A nodular mass in the right lower lobe measures 3.7 cm.  No destructive bony lesions are identified.  No acute infiltrate or pulmonary edema.  Original Report Authenticated By: Natasha Mead, M.D.   Mr Laqueta Jean Wo Contrast  11/30/2011  *RADIOLOGY REPORT*  Clinical Data: 78 year old female with recent lung cancer diagnosis.  Dizziness and headaches.  MRI HEAD WITHOUT AND WITH CONTRAST  Technique:  Multiplanar, multiecho pulse sequences of the brain and surrounding structures were obtained according to standard protocol without and with intravenous contrast  Contrast: 15mL MULTIHANCE GADOBENATE DIMEGLUMINE 529 MG/ML IV SOLN  Comparison: None.  Findings: No abnormal enhancement identified.  No midline shift, mass effect, or evidence of mass lesion.  No restricted diffusion to suggest acute infarction.  No ventriculomegaly. No acute intracranial hemorrhage identified. Negative pituitary and cervicomedullary junction.  Degenerative  changes in the visualized cervical spine which otherwise is within normal limits. Major intracranial vascular flow voids are preserved.  Normal for age gray-white matter signal throughout the brain.  Nonspecific heterogeneous bone marrow signal, including some decreased T1 signal in the clivus.  No expansile or destructive osseous lesion is identified.  Grossly normal visualized internal auditory structures.  Mastoids are clear.  Minor maxillary sinus mucosal thickening.  Other paranasal sinuses are clear.  Postoperative changes to the globes.  Negative scalp soft tissues.  IMPRESSION: 1. No acute or metastatic intracranial abnormality. 2.  Nonspecific heterogeneous bone marrow signal, not convincing for metastatic disease to bone at this time.  Original Report Authenticated By: Harley Hallmark, M.D.   Nm Pet Image Initial (pi) Skull Base To Thigh  11/26/2011  *RADIOLOGY REPORT*  Clinical Data: Initial treatment strategy for right lung mass.  NUCLEAR MEDICINE PET SKULL BASE TO THIGH  Fasting Blood Glucose:  95  Technique:  16.5 mCi F-18 FDG was injected intravenously. CT data was obtained and used for attenuation correction and anatomic localization only.  (This was not acquired as a diagnostic CT examination.) Additional exam technical data entered on technologist worksheet.  Comparison:  Chest CT on 11/17/2011  Findings:  Neck: 7 mm hypermetabolic lymph node is seen in the left supraclavicular region, with maximum SUV of 8.3.  No other hypermetabolic lymphadenopathy identified.  Chest:  3.5 cm right middle lobe mass shows heterogeneous hypermetabolic activity, with maximum SUV of 8.3.  This is consistent with bronchogenic carcinoma.  Bulky right hilar and mediastinal lymphadenopathy is also seen with intense hypermetabolic activity, with maximum SUV up to 16.3.  Abdomen/Pelvis:  A small 1.3 cm right adrenal nodule has mild hypermetabolic activity with maximum SUV of 4.6, highly suspicious for a right adrenal  metastasis.  No other hypermetabolic masses or lymphadenopathy identified within the abdomen or pelvis.  [place]:  No focal hypermetabolic activity to suggest skeletal metastasis.  IMPRESSION:  1.  3.5 cm right middle lobe hypermetabolic mass, consistent with primary bronchogenic carcinoma. 2.  Metastatic lymphadenopathy in the right hilum, mediastinum, and left supraclavicular region. 3. 1.3 cm right adrenal metastasis.  Original Report Authenticated By: Danae Orleans, M.D.    ASSESSMENT/PLAN: This is a very pleasant 78 years old white female with metastatic non-small cell lung cancer, adenocarcinoma. The patient is currently undergoing systemic chemotherapy with carboplatin and Alimta status post 1 cycle. She is tolerating her treatment fairly well except for delayed nausea 2-3 days after her chemotherapy responding well to Zofran ODT. The patient was discussed with Dr. Arbutus Ped. She'll proceed with cycle #2 of her systemic chemotherapy with carboplatin and Alimta. She will continue with weekly labs consisting of a CBC differential and C. met. She'll return in 3 weeks prior to cycle #3 of her systemic chemotherapy with carboplatin for an AUC of 5 and Alimta 500 mg router squared given every 3 weeks. She did report some dizziness with position change and she's had some elevated blood pressure readings of late. She has a followup appointment with her primary care physician soon and was encouraged to make her primary care physician aware of these issues.  Laural Benes, Rosemary Pentecost E, PA-C   All questions were answered. The patient knows to call the clinic with any problems, questions or concerns. We can certainly see the patient much sooner if necessary.  I spent 20 minutes counseling the patient face to face. The total time spent in the appointment was 30 minutes.

## 2012-01-12 ENCOUNTER — Ambulatory Visit (HOSPITAL_COMMUNITY): Payer: Medicare Other | Admitting: Oncology

## 2012-01-13 ENCOUNTER — Telehealth: Payer: Self-pay | Admitting: *Deleted

## 2012-01-13 ENCOUNTER — Other Ambulatory Visit (HOSPITAL_BASED_OUTPATIENT_CLINIC_OR_DEPARTMENT_OTHER): Payer: Medicare Other | Admitting: Lab

## 2012-01-13 DIAGNOSIS — C3490 Malignant neoplasm of unspecified part of unspecified bronchus or lung: Secondary | ICD-10-CM

## 2012-01-13 LAB — COMPREHENSIVE METABOLIC PANEL (CC13)
Alkaline Phosphatase: 106 U/L (ref 40–150)
BUN: 25 mg/dL (ref 7.0–26.0)
Glucose: 142 mg/dl — ABNORMAL HIGH (ref 70–99)
Sodium: 138 mEq/L (ref 136–145)
Total Bilirubin: 1.5 mg/dL — ABNORMAL HIGH (ref 0.20–1.20)
Total Protein: 7.4 g/dL (ref 6.4–8.3)

## 2012-01-13 LAB — CBC WITH DIFFERENTIAL/PLATELET
Eosinophils Absolute: 0 10*3/uL (ref 0.0–0.5)
LYMPH%: 23.2 % (ref 14.0–49.7)
MCHC: 35 g/dL (ref 31.5–36.0)
MCV: 97 fL (ref 79.5–101.0)
MONO%: 6.8 % (ref 0.0–14.0)
NEUT#: 1.8 10*3/uL (ref 1.5–6.5)
Platelets: 166 10*3/uL (ref 145–400)
RBC: 3.68 10*6/uL — ABNORMAL LOW (ref 3.70–5.45)

## 2012-01-13 NOTE — Telephone Encounter (Signed)
Patient here for 0945 lab appointment.  Walk -in form completed.  1. Asking if her records have been sent to Dr. Jerelyn Scott.  Saw her PCP this recently and he does not have any records from our office regarding her cancer.   2. Also reports LLQ pain that started last night.  Describes as sharp, continual pain.  Has not taken any OTC meds yet.  Denies constipation.  Takes stool softner and bowels move daily.  Does have nausea from Alimta/Carboplatin chemotherapy received on 01-06-2012.  Next f/u and treatment due 01-27-2012.  Asked that she try tylenol, monitor pain and call if changes in pain. 3. She also asked how often she is to receive vitamin B12 injections.  Informed her these are given every three cycles.  Third cycle due 01-27-2012.  She thanked me for the help and walked out.

## 2012-01-20 ENCOUNTER — Other Ambulatory Visit (HOSPITAL_BASED_OUTPATIENT_CLINIC_OR_DEPARTMENT_OTHER): Payer: Medicare Other | Admitting: Lab

## 2012-01-20 ENCOUNTER — Encounter: Payer: Self-pay | Admitting: Certified Registered Nurse Anesthetist

## 2012-01-20 DIAGNOSIS — C3490 Malignant neoplasm of unspecified part of unspecified bronchus or lung: Secondary | ICD-10-CM

## 2012-01-20 LAB — COMPREHENSIVE METABOLIC PANEL (CC13)
Alkaline Phosphatase: 120 U/L (ref 40–150)
BUN: 12 mg/dL (ref 7.0–26.0)
Glucose: 93 mg/dl (ref 70–99)
Total Bilirubin: 0.4 mg/dL (ref 0.20–1.20)

## 2012-01-20 LAB — CBC WITH DIFFERENTIAL/PLATELET
Basophils Absolute: 0 10*3/uL (ref 0.0–0.1)
Eosinophils Absolute: 0 10*3/uL (ref 0.0–0.5)
HCT: 28.3 % — ABNORMAL LOW (ref 34.8–46.6)
HGB: 10 g/dL — ABNORMAL LOW (ref 11.6–15.9)
LYMPH%: 28.5 % (ref 14.0–49.7)
MCV: 96.7 fL (ref 79.5–101.0)
MONO%: 28 % — ABNORMAL HIGH (ref 0.0–14.0)
NEUT#: 1.2 10*3/uL — ABNORMAL LOW (ref 1.5–6.5)
NEUT%: 42.7 % (ref 38.4–76.8)
Platelets: 184 10*3/uL (ref 145–400)

## 2012-01-20 NOTE — Progress Notes (Signed)
Walk-in form received from triage.  Pt. has lab appointment today but has symptoms of gout in right foot.   Pt. is being followed by Dr. Donnald Garre for lung cancer at [facility] and provider from [facility] for gout.  Assessed pt. In lobby while waiting for lab work.  Pt. stated that since she was  coming here today, thought MD from this [facility] would see her  about the foot pain. Instructed pt. to call physician who has originally diagnosed and prescribed her medicine for gout for reassessment and possible dose adjustment.  Pt. aware and will call md's office.  Above relayed to Dr. Arbutus Ped.  Pt. expressed understanding with visitor sitting next to her.

## 2012-01-22 ENCOUNTER — Telehealth: Payer: Self-pay | Admitting: *Deleted

## 2012-01-22 NOTE — Telephone Encounter (Signed)
Pt called stating that she had a gout flare up and was given a tapering prednisone dose.  She is scheduled to stop taking it by 10/9.  She is scheduled for chemo 10/9 and should be taking decadron the day before of and after chemo.  Informed pt that this RN will clarify whether she needs to still take her decadron or hold it due to being on the prednisone.  She verbalized understanding.  SLJ

## 2012-01-25 ENCOUNTER — Telehealth: Payer: Self-pay | Admitting: *Deleted

## 2012-01-25 NOTE — Telephone Encounter (Signed)
Pt called stating that her MD put her on prednisone for gout and she will be tapering off by Wednesday and wanted to know whether she needed to take decadron with the alimta this week.  Per Dr Donnald Garre, okay to take decadron as originally ordered the day before, of and after chemo.  SLJ

## 2012-01-27 ENCOUNTER — Ambulatory Visit (HOSPITAL_BASED_OUTPATIENT_CLINIC_OR_DEPARTMENT_OTHER): Payer: Medicare Other | Admitting: Physician Assistant

## 2012-01-27 ENCOUNTER — Telehealth: Payer: Self-pay | Admitting: *Deleted

## 2012-01-27 ENCOUNTER — Other Ambulatory Visit (HOSPITAL_BASED_OUTPATIENT_CLINIC_OR_DEPARTMENT_OTHER): Payer: Medicare Other | Admitting: Lab

## 2012-01-27 ENCOUNTER — Ambulatory Visit (HOSPITAL_BASED_OUTPATIENT_CLINIC_OR_DEPARTMENT_OTHER): Payer: Medicare Other

## 2012-01-27 ENCOUNTER — Telehealth: Payer: Self-pay | Admitting: Internal Medicine

## 2012-01-27 ENCOUNTER — Encounter: Payer: Self-pay | Admitting: Physician Assistant

## 2012-01-27 VITALS — BP 157/77 | HR 76 | Temp 97.0°F | Resp 18 | Ht 61.0 in | Wt 157.5 lb

## 2012-01-27 DIAGNOSIS — C342 Malignant neoplasm of middle lobe, bronchus or lung: Secondary | ICD-10-CM

## 2012-01-27 DIAGNOSIS — C3490 Malignant neoplasm of unspecified part of unspecified bronchus or lung: Secondary | ICD-10-CM

## 2012-01-27 DIAGNOSIS — R110 Nausea: Secondary | ICD-10-CM

## 2012-01-27 DIAGNOSIS — C7970 Secondary malignant neoplasm of unspecified adrenal gland: Secondary | ICD-10-CM

## 2012-01-27 DIAGNOSIS — Z5111 Encounter for antineoplastic chemotherapy: Secondary | ICD-10-CM

## 2012-01-27 LAB — CBC WITH DIFFERENTIAL/PLATELET
Basophils Absolute: 0 10*3/uL (ref 0.0–0.1)
Eosinophils Absolute: 0 10*3/uL (ref 0.0–0.5)
HGB: 11.3 g/dL — ABNORMAL LOW (ref 11.6–15.9)
LYMPH%: 5.4 % — ABNORMAL LOW (ref 14.0–49.7)
MCV: 98.8 fL (ref 79.5–101.0)
MONO#: 0.9 10*3/uL (ref 0.1–0.9)
MONO%: 6.5 % (ref 0.0–14.0)
NEUT#: 12.4 10*3/uL — ABNORMAL HIGH (ref 1.5–6.5)
Platelets: 427 10*3/uL — ABNORMAL HIGH (ref 145–400)
RDW: 14.8 % — ABNORMAL HIGH (ref 11.2–14.5)
WBC: 14.1 10*3/uL — ABNORMAL HIGH (ref 3.9–10.3)

## 2012-01-27 LAB — COMPREHENSIVE METABOLIC PANEL (CC13)
ALT: 22 U/L (ref 0–55)
CO2: 21 mEq/L — ABNORMAL LOW (ref 22–29)
Calcium: 10.6 mg/dL — ABNORMAL HIGH (ref 8.4–10.4)
Chloride: 104 mEq/L (ref 98–107)
Glucose: 110 mg/dl — ABNORMAL HIGH (ref 70–99)
Sodium: 139 mEq/L (ref 136–145)
Total Bilirubin: 0.3 mg/dL (ref 0.20–1.20)
Total Protein: 7.9 g/dL (ref 6.4–8.3)

## 2012-01-27 MED ORDER — CYANOCOBALAMIN 1000 MCG/ML IJ SOLN
1000.0000 ug | Freq: Once | INTRAMUSCULAR | Status: AC
  Administered 2012-01-27: 1000 ug via INTRAMUSCULAR

## 2012-01-27 MED ORDER — ONDANSETRON 16 MG/50ML IVPB (CHCC)
16.0000 mg | Freq: Once | INTRAVENOUS | Status: AC
  Administered 2012-01-27: 16 mg via INTRAVENOUS

## 2012-01-27 MED ORDER — SODIUM CHLORIDE 0.9 % IV SOLN
Freq: Once | INTRAVENOUS | Status: AC
  Administered 2012-01-27: 12:00:00 via INTRAVENOUS

## 2012-01-27 MED ORDER — PEMETREXED DISODIUM CHEMO INJECTION 500 MG
500.0000 mg/m2 | Freq: Once | INTRAVENOUS | Status: AC
  Administered 2012-01-27: 900 mg via INTRAVENOUS
  Filled 2012-01-27: qty 36

## 2012-01-27 MED ORDER — SODIUM CHLORIDE 0.9 % IV SOLN
445.0000 mg | Freq: Once | INTRAVENOUS | Status: AC
  Administered 2012-01-27: 450 mg via INTRAVENOUS
  Filled 2012-01-27: qty 45

## 2012-01-27 MED ORDER — LORAZEPAM 0.5 MG PO TABS: ORAL_TABLET | ORAL | Status: DC

## 2012-01-27 MED ORDER — DEXAMETHASONE SODIUM PHOSPHATE 4 MG/ML IJ SOLN
20.0000 mg | Freq: Once | INTRAMUSCULAR | Status: AC
  Administered 2012-01-27: 20 mg via INTRAVENOUS

## 2012-01-27 NOTE — Patient Instructions (Addendum)
Continue weekly labs Follow up with Dr. Mohamed in 3 weeks with a restaging CT scan of your chest, abdomen and pelvis to re-evaluate your disease 

## 2012-01-27 NOTE — Telephone Encounter (Signed)
appts made and printed for pt,mkm appt placed on 11/20

## 2012-01-27 NOTE — Telephone Encounter (Signed)
Per staff phone call I have adjusted 11/20 appt. JMW

## 2012-01-27 NOTE — Progress Notes (Signed)
Lorazepam prescription called in as ordered by Dimas Alexandria, PA, per pt's request.  Adrena aware.  Old prescription shredded.

## 2012-01-27 NOTE — Patient Instructions (Signed)
Patient aware of next appointment; discharged home with no complaints. 

## 2012-01-27 NOTE — Telephone Encounter (Signed)
Per staff message I have adjusted the appt for 11/20.   JMW

## 2012-02-01 NOTE — Progress Notes (Signed)
[facility] Telephone:([phone]) [phone]   Fax:(336) [phone]  OFFICE PROGRESS NOTE  [name], MD [address] [place] [address]  DIAGNOSIS: Metastatic non-small cell lung cancer, adenocarcinoma with negative EGFR mutation and negative ALK gene translocation diagnosed in August of 2013  PRIOR THERAPY: None.  CURRENT THERAPY: Systemic chemotherapy with carboplatin for AUC of 5 and Alimta 500 mg/M2 every 3 weeks. The patient is status post 2 cycles.   INTERVAL HISTORY: [name] 78 y.o. female returns to the clinic today for followup visit accompanied by her daughter [name]. She is tolerating her systemic chemotherapy with carboplatin and Alimta relatively well the exception of significant nausea. She currently has a Zofran for nausea as well as Compazine. She is taking Zofran instead of Compazine. She is experiencing an episode of gout involving her right first toe which is swollen and tender. She is currently completing a prednisone taper and also is using some Voltaran gel every 6 hours. She's also been placed on some Atrovent nasal spray for her seasonal allergy symptoms.  She presents to proceed with cycle #3 of her systemic chemotherapy with carboplatin and Alimta. She denied having any significant fever or chills. She has no vomiting, no significant constipation or diarrhea. She has no significant chest pain, shortness breath, cough or hemoptysis.  MEDICAL HISTORY: Past Medical History  Diagnosis Date  . Cervical spondylosis   . Renal cyst     simple bilateral 2008  . Diverticulosis   . Vertigo, benign positional   . Hyperlipidemia   . Aortic sclerosis     by exam,  . Erosive osteoarthritis of hand   . History of calcium pyrophosphate deposition disease (CPPD)   . Gout     vs pseudo gout flares in past  . Mediastinal adenopathy   . Tobacco use   . COPD, mild   . Glaucoma   . Hypertension   . Osteopenia   . Hiatal hernia   .  Hemorrhoids   . H/O urinary incontinence   . Childhood asthma   . Bronchitis   . TIA (transient ischemic attack)   . Claustrophobia   . Dizziness   . Bleeding from the nose     hx of severe nose bleeds-last time was 2 yrs ago, slight bleed last week    ALLERGIES:  is allergic to shellfish allergy; septra ds; statins; tape; and wellbutrin.  MEDICATIONS:  Current Outpatient Prescriptions  Medication Sig Dispense Refill  . ADVAIR DISKUS 250-50 MCG/DOSE AEPB       . ALPRAZolam (XANAX) 0.25 MG tablet       . aspirin 81 MG tablet Take 81 mg by mouth daily.      . B Complex-C (B-COMPLEX WITH VITAMIN C) tablet Take 1 tablet by mouth daily.      . Cholecalciferol (VITAMIN D) 2000 UNITS tablet Take 2,000 Units by mouth daily.      . colchicine 0.6 MG tablet Take 0.6 mg by mouth daily as needed. For gout flare      . dexamethasone (DECADRON) 4 MG tablet       . diclofenac sodium (VOLTAREN) 1 % GEL Apply 1 application topically 4 (four) times daily.       Marland Kitchen diltiazem 2 % GEL Apply 1 application topically 4 (four) times daily.       Marland Kitchen estrogens, conjugated, (PREMARIN) 0.625 MG tablet Take 0.625 mg by mouth daily. Take daily for 21 days then do  not take for 7 days.      . fish oil-omega-3 fatty acids 1000 MG capsule Take 1 g by mouth 2 (two) times daily.       . folic acid (FOLVITE) 1 MG tablet Take 1 tablet (1 mg total) by mouth daily.  30 tablet  4  . LORazepam (ATIVAN) 0.5 MG tablet Take 1 tablet by mouth or sublingually every 8 hours as needed for nausea  30 tablet  0  . methocarbamol (ROBAXIN) 500 MG tablet       . Multiple Vitamin (MULTIVITAMIN WITH MINERALS) TABS Take 1 tablet by mouth daily.      . nebivolol (BYSTOLIC) 5 MG tablet Take 5 mg by mouth daily with supper.       . ondansetron (ZOFRAN-ODT) 8 MG disintegrating tablet       . OVER THE COUNTER MEDICATION Take 375 mg by mouth 2 (two) times daily. "Mega Beta Sitosterol 375 mg"      . OVER THE COUNTER MEDICATION Take 3 capsules by  mouth daily as needed. "Tart Cherry Ultra"; for gout flare      . predniSONE (DELTASONE) 5 MG tablet       . prochlorperazine (COMPAZINE) 10 MG tablet Take 1 tablet (10 mg total) by mouth every 6 (six) hours as needed.  60 tablet  0  . timolol (TIMOPTIC-XR) 0.5 % ophthalmic gel-forming Place 1 drop into the left eye daily.         SURGICAL HISTORY:  Past Surgical History  Procedure Date  . Cystoscopy, transurethral collagen injection therapy   . Left total knee arthroplasty   . Appendectomy   . Hemorrhoid surgery   . Cataract surgery x 2   . Eye surgery   . Tonsillectomy   . Abdominal hysterectomy   . Colonoscopy     REVIEW OF SYSTEMS:  Pertinent items are noted in HPI.   PHYSICAL EXAMINATION: General appearance: alert, cooperative and no distress Head: Normocephalic, without obvious abnormality, atraumatic Neck: no adenopathy Lymph nodes: Cervical, supraclavicular, and axillary nodes normal. Resp: clear to auscultation bilaterally Back: negative, symmetric, no curvature. ROM normal. No CVA tenderness. Cardio: regular rate and rhythm, S1, S2 normal, no murmur, click, rub or gallop GI: soft, non-tender; bowel sounds normal; no masses,  no organomegaly Extremities: extremities normal, atraumatic, no cyanosis or edema Neurologic: Alert and oriented X 3, normal strength and tone. Normal symmetric reflexes. Normal coordination and gait  ECOG PERFORMANCE STATUS: 1 - Symptomatic but completely ambulatory  Blood pressure 157/77, pulse 76, temperature 97 F (36.1 C), temperature source Oral, resp. rate 18, height 5\' 1"  (1.549 m), weight 157 lb 8 oz (71.442 kg).  LABORATORY DATA: Lab Results  Component Value Date   WBC 14.1* 01/27/2012   HGB 11.3* 01/27/2012   HCT 33.9* 01/27/2012   MCV 98.8 01/27/2012   PLT 427* 01/27/2012      Chemistry      Component Value Date/Time   NA 139 01/27/2012 1004   NA 140 11/26/2011 1041   K 4.8 01/27/2012 1004   K 4.3 11/26/2011 1041   CL 104  01/27/2012 1004   CL 104 11/26/2011 1041   CO2 21* 01/27/2012 1004   CO2 24 11/26/2011 1041   BUN 28.0* 01/27/2012 1004   BUN 18 11/26/2011 1041   CREATININE 0.9 01/27/2012 1004   CREATININE 0.85 11/26/2011 1041      Component Value Date/Time   CALCIUM 10.6* 01/27/2012 1004   CALCIUM 9.7 11/26/2011 1041  ALKPHOS 107 01/27/2012 1004   ALKPHOS 82 11/26/2011 1041   AST 24 01/27/2012 1004   AST 25 11/26/2011 1041   ALT 22 01/27/2012 1004   ALT 15 11/26/2011 1041   BILITOT 0.30 01/27/2012 1004   BILITOT 0.3 11/26/2011 1041       RADIOGRAPHIC STUDIES: Dg Chest 2 View Within Previous 72 Hours.  Films Obtained On Friday Are Acceptable For Monday And Tuesday Cases  11/26/2011  *RADIOLOGY REPORT*  Clinical Data: Preop  CHEST - 2 VIEW  Comparison: C t chest 11/17/2011  Findings: Cardiomediastinal silhouette is stable.  Again noted mediastinal and right hilar / infrahilar adenopathy.  A nodular mass in the right lower lobe measures 3.7 cm.  No destructive bony lesions are identified.  No acute infiltrate or pulmonary edema.  IMPRESSION:  Again noted mediastinal and right hilar / infrahilar adenopathy. A nodular mass in the right lower lobe measures 3.7 cm.  No destructive bony lesions are identified.  No acute infiltrate or pulmonary edema.  Original Report Authenticated By: Natasha Mead, M.D.   Mr Laqueta Jean Wo Contrast  11/30/2011  *RADIOLOGY REPORT*  Clinical Data: 78 year old female with recent lung cancer diagnosis.  Dizziness and headaches.  MRI HEAD WITHOUT AND WITH CONTRAST  Technique:  Multiplanar, multiecho pulse sequences of the brain and surrounding structures were obtained according to standard protocol without and with intravenous contrast  Contrast: 15mL MULTIHANCE GADOBENATE DIMEGLUMINE 529 MG/ML IV SOLN  Comparison: None.  Findings: No abnormal enhancement identified.  No midline shift, mass effect, or evidence of mass lesion.  No restricted diffusion to suggest acute infarction.  No ventriculomegaly. No acute  intracranial hemorrhage identified. Negative pituitary and cervicomedullary junction.  Degenerative changes in the visualized cervical spine which otherwise is within normal limits. Major intracranial vascular flow voids are preserved.  Normal for age gray-white matter signal throughout the brain.  Nonspecific heterogeneous bone marrow signal, including some decreased T1 signal in the clivus.  No expansile or destructive osseous lesion is identified.  Grossly normal visualized internal auditory structures.  Mastoids are clear.  Minor maxillary sinus mucosal thickening.  Other paranasal sinuses are clear.  Postoperative changes to the globes.  Negative scalp soft tissues.  IMPRESSION: 1. No acute or metastatic intracranial abnormality. 2.  Nonspecific heterogeneous bone marrow signal, not convincing for metastatic disease to bone at this time.  Original Report Authenticated By: Harley Hallmark, M.D.   Nm Pet Image Initial (pi) Skull Base To Thigh  11/26/2011  *RADIOLOGY REPORT*  Clinical Data: Initial treatment strategy for right lung mass.  NUCLEAR MEDICINE PET SKULL BASE TO THIGH  Fasting Blood Glucose:  95  Technique:  16.5 mCi F-18 FDG was injected intravenously. CT data was obtained and used for attenuation correction and anatomic localization only.  (This was not acquired as a diagnostic CT examination.) Additional exam technical data entered on technologist worksheet.  Comparison:  Chest CT on 11/17/2011  Findings:  Neck: 7 mm hypermetabolic lymph node is seen in the left supraclavicular region, with maximum SUV of 8.3.  No other hypermetabolic lymphadenopathy identified.  Chest:  3.5 cm right middle lobe mass shows heterogeneous hypermetabolic activity, with maximum SUV of 8.3.  This is consistent with bronchogenic carcinoma.  Bulky right hilar and mediastinal lymphadenopathy is also seen with intense hypermetabolic activity, with maximum SUV up to 16.3.  Abdomen/Pelvis:  A small 1.3 cm right adrenal nodule  has mild hypermetabolic activity with maximum SUV of 4.6, highly suspicious for a right adrenal metastasis.  No other hypermetabolic masses or lymphadenopathy identified within the abdomen or pelvis.  [place]:  No focal hypermetabolic activity to suggest skeletal metastasis.  IMPRESSION:  1.  3.5 cm right middle lobe hypermetabolic mass, consistent with primary bronchogenic carcinoma. 2.  Metastatic lymphadenopathy in the right hilum, mediastinum, and left supraclavicular region. 3. 1.3 cm right adrenal metastasis.  Original Report Authenticated By: Danae Orleans, M.D.    ASSESSMENT/PLAN: This is a very pleasant 78 years old white female with metastatic non-small cell lung cancer, adenocarcinoma. The patient is currently undergoing systemic chemotherapy with carboplatin and Alimta status post 1 cycle. She is tolerating her treatment fairly well except for delayed nausea 2-3 days after her chemotherapy. She did make C. some response to the Zofran ODT but describes it as suboptimal. The patient was discussed with Dr. Arbutus Ped. We will add Ativan 0.5 mg by mouth or sublingually every 8 hours which she may alternate with Zofran. If these 2 medications do not control her nausea we may consider adding these thank you so patch prior to beginning her next cycle of chemotherapy. She'll followup with Dr. Arbutus Ped in 3 weeks with repeat CBC differential and C. met as well as a CT of the chest abdomen and pelvis with contrast to reevaluate her disease. She will continue with weekly labs also consisting of a CBC differential and C. Met.  Laural Benes, Maddox Hlavaty E, PA-C   All questions were answered. The patient knows to call the clinic with any problems, questions or concerns. We can certainly see the patient much sooner if necessary.  I spent 20 minutes counseling the patient face to face. The total time spent in the appointment was 30 minutes.

## 2012-02-03 ENCOUNTER — Other Ambulatory Visit: Payer: Self-pay | Admitting: Oncology

## 2012-02-03 ENCOUNTER — Other Ambulatory Visit (HOSPITAL_BASED_OUTPATIENT_CLINIC_OR_DEPARTMENT_OTHER): Payer: Medicare Other | Admitting: Lab

## 2012-02-03 ENCOUNTER — Telehealth: Payer: Self-pay | Admitting: *Deleted

## 2012-02-03 DIAGNOSIS — C3490 Malignant neoplasm of unspecified part of unspecified bronchus or lung: Secondary | ICD-10-CM

## 2012-02-03 LAB — CBC WITH DIFFERENTIAL/PLATELET
Basophils Absolute: 0 10*3/uL (ref 0.0–0.1)
EOS%: 0.3 % (ref 0.0–7.0)
Eosinophils Absolute: 0 10*3/uL (ref 0.0–0.5)
HCT: 31.9 % — ABNORMAL LOW (ref 34.8–46.6)
HGB: 11.1 g/dL — ABNORMAL LOW (ref 11.6–15.9)
MCH: 34.2 pg — ABNORMAL HIGH (ref 25.1–34.0)
MCV: 98.6 fL (ref 79.5–101.0)
MONO%: 10.8 % (ref 0.0–14.0)
NEUT#: 2.8 10*3/uL (ref 1.5–6.5)
NEUT%: 73.5 % (ref 38.4–76.8)

## 2012-02-03 LAB — COMPREHENSIVE METABOLIC PANEL (CC13)
AST: 26 U/L (ref 5–34)
Albumin: 3.4 g/dL — ABNORMAL LOW (ref 3.5–5.0)
Alkaline Phosphatase: 100 U/L (ref 40–150)
BUN: 29 mg/dL — ABNORMAL HIGH (ref 7.0–26.0)
Calcium: 9.4 mg/dL (ref 8.4–10.4)
Chloride: 103 mEq/L (ref 98–107)
Creatinine: 1 mg/dL (ref 0.6–1.1)
Glucose: 98 mg/dl (ref 70–99)

## 2012-02-03 NOTE — Telephone Encounter (Signed)
Walk in form received by this nurse at 0957.  Patient reports she started throwing up at 0300 and stopped at 0600 and still not feeling well.     Noted Third cycle of Carboplatin/Alimta received on 01-27-2012.  Patient has no previous complaints of nausea but was given script for lorazepam on last treatment day.   Spoke with her in the lobby.  She looks remarkably well dressed and no signs of not feeling well noted.  Took lorazepam on Monday and it "knocked me out so didn't take it yesterday.  With emesis used zofran ODT.  She is no longer nauseated but hasn't eaten anything today.  Emesis looked like the tomato bisque soup she ate last evening. Had a bm after n/v episode.  Reports having soft bm this morning after balls on yesterday.  Takes miralax every morning for bowels.  Reports feeling a "catching pain to left mid abdominal area at waist."  Has not taken anything for pain stating it's much better today than the last two days.  Says she just feels exhausted.   Expressed that the carboplatin causes n/v in 65 %.  Discussed the chronic fatigue and to "let her body be her guide to daily activity".  Instructed to take tylenol or ibuprofen for pain if needed.  Increase fluid/water intake to 64 oz daily and to continue the miralax.

## 2012-02-10 ENCOUNTER — Other Ambulatory Visit: Payer: Medicare Other | Admitting: Lab

## 2012-02-11 ENCOUNTER — Ambulatory Visit (HOSPITAL_COMMUNITY)
Admission: RE | Admit: 2012-02-11 | Discharge: 2012-02-11 | Disposition: A | Discharge status: Home / Self Care | Payer: Medicare Other | Source: Ambulatory Visit | Attending: Physician Assistant | Admitting: Physician Assistant

## 2012-02-11 ENCOUNTER — Other Ambulatory Visit (HOSPITAL_BASED_OUTPATIENT_CLINIC_OR_DEPARTMENT_OTHER): Payer: Medicare Other | Admitting: Lab

## 2012-02-11 DIAGNOSIS — R918 Other nonspecific abnormal finding of lung field: Secondary | ICD-10-CM | POA: Insufficient documentation

## 2012-02-11 DIAGNOSIS — Z1289 Encounter for screening for malignant neoplasm of other sites: Secondary | ICD-10-CM | POA: Insufficient documentation

## 2012-02-11 DIAGNOSIS — C3490 Malignant neoplasm of unspecified part of unspecified bronchus or lung: Secondary | ICD-10-CM

## 2012-02-11 DIAGNOSIS — R599 Enlarged lymph nodes, unspecified: Secondary | ICD-10-CM | POA: Insufficient documentation

## 2012-02-11 LAB — CBC WITH DIFFERENTIAL/PLATELET
Basophils Absolute: 0 10*3/uL (ref 0.0–0.1)
EOS%: 1.5 % (ref 0.0–7.0)
Eosinophils Absolute: 0 10*3/uL (ref 0.0–0.5)
HCT: 27.3 % — ABNORMAL LOW (ref 34.8–46.6)
HGB: 9.4 g/dL — ABNORMAL LOW (ref 11.6–15.9)
MCH: 34.6 pg — ABNORMAL HIGH (ref 25.1–34.0)
NEUT#: 1 10*3/uL — ABNORMAL LOW (ref 1.5–6.5)
NEUT%: 43.9 % (ref 38.4–76.8)
RDW: 15.8 % — ABNORMAL HIGH (ref 11.2–14.5)
lymph#: 0.7 10*3/uL — ABNORMAL LOW (ref 0.9–3.3)

## 2012-02-11 LAB — COMPREHENSIVE METABOLIC PANEL (CC13)
Albumin: 3.5 g/dL (ref 3.5–5.0)
BUN: 13 mg/dL (ref 7.0–26.0)
CO2: 23 mEq/L (ref 22–29)
Calcium: 9.4 mg/dL (ref 8.4–10.4)
Chloride: 104 mEq/L (ref 98–107)
Creatinine: 0.8 mg/dL (ref 0.6–1.1)
Glucose: 87 mg/dl (ref 70–99)
Potassium: 4.1 mEq/L (ref 3.5–5.1)

## 2012-02-11 MED ORDER — IOHEXOL 300 MG/ML  SOLN
100.0000 mL | Freq: Once | INTRAMUSCULAR | Status: AC | PRN
  Administered 2012-02-11: 100 mL via INTRAVENOUS

## 2012-02-17 ENCOUNTER — Ambulatory Visit (HOSPITAL_BASED_OUTPATIENT_CLINIC_OR_DEPARTMENT_OTHER): Payer: Medicare Other | Admitting: Internal Medicine

## 2012-02-17 ENCOUNTER — Telehealth: Payer: Self-pay | Admitting: Internal Medicine

## 2012-02-17 ENCOUNTER — Other Ambulatory Visit: Payer: Medicare Other | Admitting: Lab

## 2012-02-17 ENCOUNTER — Ambulatory Visit (HOSPITAL_BASED_OUTPATIENT_CLINIC_OR_DEPARTMENT_OTHER): Payer: Medicare Other

## 2012-02-17 ENCOUNTER — Ambulatory Visit (HOSPITAL_BASED_OUTPATIENT_CLINIC_OR_DEPARTMENT_OTHER): Payer: Medicare Other | Admitting: Lab

## 2012-02-17 VITALS — BP 173/78 | HR 80 | Temp 97.0°F | Resp 18 | Ht 61.0 in | Wt 155.2 lb

## 2012-02-17 DIAGNOSIS — M899 Disorder of bone, unspecified: Secondary | ICD-10-CM

## 2012-02-17 DIAGNOSIS — C342 Malignant neoplasm of middle lobe, bronchus or lung: Secondary | ICD-10-CM

## 2012-02-17 DIAGNOSIS — C7970 Secondary malignant neoplasm of unspecified adrenal gland: Secondary | ICD-10-CM

## 2012-02-17 DIAGNOSIS — Z5111 Encounter for antineoplastic chemotherapy: Secondary | ICD-10-CM

## 2012-02-17 DIAGNOSIS — C3490 Malignant neoplasm of unspecified part of unspecified bronchus or lung: Secondary | ICD-10-CM

## 2012-02-17 LAB — COMPREHENSIVE METABOLIC PANEL (CC13)
ALT: 20 U/L (ref 0–55)
AST: 29 U/L (ref 5–34)
Albumin: 3.8 g/dL (ref 3.5–5.0)
Alkaline Phosphatase: 94 U/L (ref 40–150)
BUN: 19 mg/dL (ref 7.0–26.0)
CO2: 23 mEq/L (ref 22–29)
Calcium: 10.4 mg/dL (ref 8.4–10.4)
Chloride: 108 mEq/L — ABNORMAL HIGH (ref 98–107)
Creatinine: 0.9 mg/dL (ref 0.6–1.1)
Glucose: 150 mg/dl — ABNORMAL HIGH (ref 70–99)
Potassium: 4.8 mEq/L (ref 3.5–5.1)
Sodium: 140 mEq/L (ref 136–145)
Total Bilirubin: 0.4 mg/dL (ref 0.20–1.20)
Total Protein: 7.9 g/dL (ref 6.4–8.3)

## 2012-02-17 LAB — CBC WITH DIFFERENTIAL/PLATELET
BASO%: 0.2 % (ref 0.0–2.0)
EOS%: 0.5 % (ref 0.0–7.0)
HGB: 10.4 g/dL — ABNORMAL LOW (ref 11.6–15.9)
MCH: 32.5 pg (ref 25.1–34.0)
MCHC: 32 g/dL (ref 31.5–36.0)
RDW: 17.2 % — ABNORMAL HIGH (ref 11.2–14.5)
lymph#: 0.9 10*3/uL (ref 0.9–3.3)

## 2012-02-17 MED ORDER — SODIUM CHLORIDE 0.9 % IV SOLN
Freq: Once | INTRAVENOUS | Status: AC
  Administered 2012-02-17: 11:00:00 via INTRAVENOUS

## 2012-02-17 MED ORDER — ONDANSETRON 16 MG/50ML IVPB (CHCC)
16.0000 mg | Freq: Once | INTRAVENOUS | Status: AC
  Administered 2012-02-17: 16 mg via INTRAVENOUS

## 2012-02-17 MED ORDER — SODIUM CHLORIDE 0.9 % IV SOLN
500.0000 mg/m2 | Freq: Once | INTRAVENOUS | Status: AC
  Administered 2012-02-17: 900 mg via INTRAVENOUS
  Filled 2012-02-17: qty 36

## 2012-02-17 MED ORDER — SODIUM CHLORIDE 0.9 % IV SOLN
445.0000 mg | Freq: Once | INTRAVENOUS | Status: AC
  Administered 2012-02-17: 450 mg via INTRAVENOUS
  Filled 2012-02-17: qty 45

## 2012-02-17 MED ORDER — DEXAMETHASONE SODIUM PHOSPHATE 4 MG/ML IJ SOLN
20.0000 mg | Freq: Once | INTRAMUSCULAR | Status: AC
  Administered 2012-02-17: 20 mg via INTRAVENOUS

## 2012-02-17 NOTE — Progress Notes (Signed)
[facility] Telephone:([phone]   Fax:(336) [phone]  OFFICE PROGRESS NOTE  [name], MD [address] [place] [address]  DIAGNOSIS: Metastatic non-small cell lung cancer, adenocarcinoma with negative EGFR mutation and negative ALK gene translocation diagnosed in August of 2013   PRIOR THERAPY: None.   CURRENT THERAPY: Systemic chemotherapy with carboplatin for AUC of 5 and Alimta 500 mg/M2 every 3 weeks. The patient is status post 3 cycles.  INTERVAL HISTORY: [name] 78 y.o. female returns to the clinic today for followup visit accompanied by her daughter and friend. The patient is feeling fine and tolerated the last cycle of her systemic chemotherapy fairly well except for mild fatigue 2-4 days after her treatment. She denied having any significant chest pain, shortness breath, cough or hemoptysis. She denied having any nausea or vomiting. No significant weight loss. The patient has repeat CT scan of the chest, abdomen and pelvis performed recently and she is here for evaluation and discussion of her scan results.  MEDICAL HISTORY: Past Medical History  Diagnosis Date  . Cervical spondylosis   . Renal cyst     simple bilateral 2008  . Diverticulosis   . Vertigo, benign positional   . Hyperlipidemia   . Aortic sclerosis     by exam,  . Erosive osteoarthritis of hand   . History of calcium pyrophosphate deposition disease (CPPD)   . Gout     vs pseudo gout flares in past  . Mediastinal adenopathy   . Tobacco use   . COPD, mild   . Glaucoma   . Hypertension   . Osteopenia   . Hiatal hernia   . Hemorrhoids   . H/O urinary incontinence   . Childhood asthma   . Bronchitis   . TIA (transient ischemic attack)   . Claustrophobia   . Dizziness   . Bleeding from the nose     hx of severe nose bleeds-last time was 2 yrs ago, slight bleed last week    ALLERGIES:  is allergic to shellfish allergy; septra ds; statins;  tape; and wellbutrin.  MEDICATIONS:  Current Outpatient Prescriptions  Medication Sig Dispense Refill  . ADVAIR DISKUS 250-50 MCG/DOSE AEPB       . ALPRAZolam (XANAX) 0.25 MG tablet       . aspirin 81 MG tablet Take 81 mg by mouth daily.      . B Complex-C (B-COMPLEX WITH VITAMIN C) tablet Take 1 tablet by mouth daily.      . Cholecalciferol (VITAMIN D) 2000 UNITS tablet Take 2,000 Units by mouth daily.      . colchicine 0.6 MG tablet Take 0.6 mg by mouth daily as needed. For gout flare      . dexamethasone (DECADRON) 4 MG tablet       . diclofenac sodium (VOLTAREN) 1 % GEL Apply 1 application topically 4 (four) times daily.       Marland Kitchen diltiazem 2 % GEL Apply 1 application topically 4 (four) times daily.       Marland Kitchen estrogens, conjugated, (PREMARIN) 0.625 MG tablet Take 0.625 mg by mouth daily. Take daily for 21 days then do not take for 7 days.      . fish oil-omega-3 fatty acids 1000 MG capsule Take 1 g by mouth 2 (two) times daily.       . folic acid (FOLVITE) 1 MG tablet Take 1 tablet (1 mg total) by mouth daily.  30  tablet  4  . LORazepam (ATIVAN) 0.5 MG tablet Take 1 tablet by mouth or sublingually every 8 hours as needed for nausea  30 tablet  0  . methocarbamol (ROBAXIN) 500 MG tablet       . Multiple Vitamin (MULTIVITAMIN WITH MINERALS) TABS Take 1 tablet by mouth daily.      . nebivolol (BYSTOLIC) 5 MG tablet Take 5 mg by mouth daily with supper.       . ondansetron (ZOFRAN-ODT) 8 MG disintegrating tablet TAKE 1 TABLET UNDER TONGUE EVERY 6 TO 8 HOURS AS NEEDED FOR NAUSEA/VOMITING  30 tablet  1  . OVER THE COUNTER MEDICATION Take 375 mg by mouth 2 (two) times daily. "Mega Beta Sitosterol 375 mg"      . OVER THE COUNTER MEDICATION Take 3 capsules by mouth daily as needed. "Tart Cherry Ultra"; for gout flare      . predniSONE (DELTASONE) 5 MG tablet       . timolol (TIMOPTIC-XR) 0.5 % ophthalmic gel-forming Place 1 drop into the left eye daily.       . prochlorperazine (COMPAZINE) 10 MG  tablet Take 1 tablet (10 mg total) by mouth every 6 (six) hours as needed.  60 tablet  0    SURGICAL HISTORY:  Past Surgical History  Procedure Date  . Cystoscopy, transurethral collagen injection therapy   . Left total knee arthroplasty   . Appendectomy   . Hemorrhoid surgery   . Cataract surgery x 2   . Eye surgery   . Tonsillectomy   . Abdominal hysterectomy   . Colonoscopy     REVIEW OF SYSTEMS:  A comprehensive review of systems was negative except for: Constitutional: positive for fatigue   PHYSICAL EXAMINATION: General appearance: alert, cooperative and no distress Head: Normocephalic, without obvious abnormality, atraumatic Neck: no adenopathy Lymph nodes: Cervical, supraclavicular, and axillary nodes normal. Resp: clear to auscultation bilaterally Cardio: regular rate and rhythm, S1, S2 normal, no murmur, click, rub or gallop GI: soft, non-tender; bowel sounds normal; no masses,  no organomegaly Extremities: extremities normal, atraumatic, no cyanosis or edema Neurologic: Alert and oriented X 3, normal strength and tone. Normal symmetric reflexes. Normal coordination and gait  ECOG PERFORMANCE STATUS: 1 - Symptomatic but completely ambulatory  Blood pressure 173/78, pulse 80, temperature 97 F (36.1 C), temperature source Oral, resp. rate 18, height 5\' 1"  (1.549 m), weight 155 lb 3.2 oz (70.398 kg).  LABORATORY DATA: Lab Results  Component Value Date   WBC 2.3* 02/11/2012   HGB 9.4* 02/11/2012   HCT 27.3* 02/11/2012   MCV 100.2 02/11/2012   PLT 183 02/11/2012      Chemistry      Component Value Date/Time   NA 139 02/11/2012 0907   NA 140 11/26/2011 1041   K 4.1 02/11/2012 0907   K 4.3 11/26/2011 1041   CL 104 02/11/2012 0907   CL 104 11/26/2011 1041   CO2 23 02/11/2012 0907   CO2 24 11/26/2011 1041   BUN 13.0 02/11/2012 0907   BUN 18 11/26/2011 1041   CREATININE 0.8 02/11/2012 0907   CREATININE 0.85 11/26/2011 1041      Component Value Date/Time   CALCIUM 9.4  02/11/2012 0907   CALCIUM 9.7 11/26/2011 1041   ALKPHOS 103 02/11/2012 0907   ALKPHOS 82 11/26/2011 1041   AST 27 02/11/2012 0907   AST 25 11/26/2011 1041   ALT 17 02/11/2012 0907   ALT 15 11/26/2011 1041   BILITOT 0.60 02/11/2012 1610  BILITOT 0.3 11/26/2011 1041       RADIOGRAPHIC STUDIES: Ct Chest W Contrast  02/11/2012  *RADIOLOGY REPORT*  Clinical Data:  Mediastinal adenopathy.  Metastatic non-small lung cancer diagnosed on biopsy.  Patient status post two cycles chemotherapy as of 02/01/2012  CT CHEST, ABDOMEN AND PELVIS WITH CONTRAST  Technique:  Multidetector CT imaging of the chest, abdomen and pelvis was performed following the standard protocol during bolus administration of intravenous contrast.  Contrast: OMNIPAQUE IOHEXOL 300 MG/ML  SOLN  Comparison:  PET CT scan 11/26/2011  CT CHEST  Findings:  No axillary lymphadenopathy.  Left supraclavicular node measures 5 mm (image #8) decreased from 11 mm on prior.  Bulky mediastinal lymphadenopathy has been reduced in volume.  For example, right lower paratracheal lymph node measures 15 mm short axis compared to 31 mm.  The largest pulmonary nodule within the right upper lobe measures 33 x 26 mm compared to 29 x 23 mm on prior.  Nodule in the right middle lobe measures 16 mm x 17 mm compared to 16 mm x 17 mm on prior.  No new pulmonary nodules are present.  IMPRESSION:  1.  Decrease in size of mediastinal and left supraclavicular lymphadenopathy. 2.  Large right lung pulmonary nodules are not reduced in size.  CT ABDOMEN AND PELVIS  Findings:  No focal hepatic lesion.  Gallbladder, pancreas, spleen, and adrenal glands are normal.  There are cysts within the kidneys which are unchanged.  The stomach, small bowel, and proximal colon are normal.  There is mild inflammation surrounding the descending colon and the left pericolic gutter.  There are several diverticula through this region.  Abdominal aorta normal caliber.  No retroperitoneal periportal  lymphadenopathy.  No free fluid the pelvis.  Post hysterectomy anatomy.  The bladder is normal.  No pelvic lymphadenopathy. A small cyst in the left ovary is stable.  Review of  bone windows demonstrates no aggressive osseous lesions.  IMPRESSION:  1.   No evidence of metastasis within the abdomen or pelvis. 2.  Mild inflammation associated the descending colon likely represents mild of acute diverticulitis.  3.  Stable small cyst within the left ovary.  Findings conveyed to Tiana Loft PA on 02/11/2012 at the 9:40 am   Original Report Authenticated By: Genevive Bi, M.D.     ASSESSMENT: This is a very pleasant 78 years old white female with stage metastatic non-small cell lung cancer, , adenocarcinoma with negative EGFR mutation and negative ALK gene translocation. She is currently undergoing systemic chemotherapy with carboplatin and Alimta status post 3 cycles with partial response especially in the mediastinal lymph nodes.  PLAN: I discussed the scan results with the patient and her family and showed them the images. I recommended for her to continue on the same treatment regimen was carboplatin for AUC of 5 and Alimta 500 mg/M2 every 3 weeks. The patient was advised to keep taking her folic acid on daily basis. She will also continue his vitamin B12 and Decadron for premedication. The patient would come back for followup visit in 3 weeks with the start of cycle #5. She was advised to call immediately if she has any concerning symptoms in the interval.  All questions were answered. The patient knows to call the clinic with any problems, questions or concerns. We can certainly see the patient much sooner if necessary.  I spent 15 minutes counseling the patient face to face. The total time spent in the appointment was 25 minutes.

## 2012-02-17 NOTE — Patient Instructions (Addendum)
[facility] Discharge Instructions for Patients Receiving Chemotherapy  Today you received the following chemotherapy agents: Carboplatin and Alimta. To help prevent nausea and vomiting after your treatment, we encourage you to take your nausea medication, Zofran and Compazine. Begin taking Zofran the morning of 02/18/12 and take it as often as prescribed for the next 72 hours to prevent nausea. Take Compazine every six hours as needed for nausea (may make you drowsy). Take Lorazepam for nausea AS NEEDED if nausea is not relieved by the other medications. Will make you drowsy. Do not take Xanax while using Lorazepam.   If you develop nausea and vomiting that is not controlled by your nausea medication, call the clinic. If it is after clinic hours your family physician or the after hours number for the clinic or go to the Emergency Department.   BELOW ARE SYMPTOMS THAT SHOULD BE REPORTED IMMEDIATELY:  *FEVER GREATER THAN 100.5 F  *CHILLS WITH OR WITHOUT FEVER  NAUSEA AND VOMITING THAT IS NOT CONTROLLED WITH YOUR NAUSEA MEDICATION  *UNUSUAL SHORTNESS OF BREATH  *UNUSUAL BRUISING OR BLEEDING  TENDERNESS IN MOUTH AND THROAT WITH OR WITHOUT PRESENCE OF ULCERS  *URINARY PROBLEMS  *BOWEL PROBLEMS  UNUSUAL RASH Items with * indicate a potential emergency and should be followed up as soon as possible. Feel free to call the clinic you have any questions or concerns. The clinic phone number is [phone].   I have been informed and understand all the instructions given to me. I know to contact the clinic, my physician, or go to the Emergency Department if any problems should occur. I do not have any questions at this time, but understand that I may call the clinic during office hours   should I have any questions or need assistance in obtaining follow up care.    __________________________________________  _____________  __________ Signature of Patient or Authorized  Representative            Date                   Time    __________________________________________ Nurse's Signature

## 2012-02-17 NOTE — Patient Instructions (Signed)
Your CT scan showed significant improvement in your disease especially in the mediastinal lymph nodes. We'll continue with the same chemotherapy with carboplatin and Alimta. Followup in 3 weeks.

## 2012-02-17 NOTE — Telephone Encounter (Signed)
appts made and printed for pt aom °

## 2012-02-24 ENCOUNTER — Other Ambulatory Visit (HOSPITAL_BASED_OUTPATIENT_CLINIC_OR_DEPARTMENT_OTHER): Payer: Medicare Other | Admitting: Lab

## 2012-02-24 DIAGNOSIS — C3490 Malignant neoplasm of unspecified part of unspecified bronchus or lung: Secondary | ICD-10-CM

## 2012-02-24 DIAGNOSIS — C342 Malignant neoplasm of middle lobe, bronchus or lung: Secondary | ICD-10-CM

## 2012-02-24 LAB — CBC WITH DIFFERENTIAL/PLATELET
Eosinophils Absolute: 0 10*3/uL (ref 0.0–0.5)
HCT: 27.7 % — ABNORMAL LOW (ref 34.8–46.6)
LYMPH%: 52.2 % — ABNORMAL HIGH (ref 14.0–49.7)
MONO#: 0.1 10*3/uL (ref 0.1–0.9)
NEUT#: 0.8 10*3/uL — ABNORMAL LOW (ref 1.5–6.5)
Platelets: 108 10*3/uL — ABNORMAL LOW (ref 145–400)
RBC: 2.79 10*6/uL — ABNORMAL LOW (ref 3.70–5.45)
WBC: 1.8 10*3/uL — ABNORMAL LOW (ref 3.9–10.3)

## 2012-02-24 LAB — COMPREHENSIVE METABOLIC PANEL (CC13)
Albumin: 3.7 g/dL (ref 3.5–5.0)
CO2: 28 mEq/L (ref 22–29)
Calcium: 9.7 mg/dL (ref 8.4–10.4)
Glucose: 95 mg/dl (ref 70–99)
Potassium: 3.8 mEq/L (ref 3.5–5.1)
Sodium: 139 mEq/L (ref 136–145)
Total Bilirubin: 0.94 mg/dL (ref 0.20–1.20)
Total Protein: 7.1 g/dL (ref 6.4–8.3)

## 2012-03-02 ENCOUNTER — Other Ambulatory Visit (HOSPITAL_BASED_OUTPATIENT_CLINIC_OR_DEPARTMENT_OTHER): Payer: Medicare Other | Admitting: Lab

## 2012-03-02 ENCOUNTER — Telehealth: Payer: Self-pay | Admitting: Medical Oncology

## 2012-03-02 ENCOUNTER — Ambulatory Visit: Payer: Medicare Other | Admitting: Nutrition

## 2012-03-02 ENCOUNTER — Ambulatory Visit (HOSPITAL_BASED_OUTPATIENT_CLINIC_OR_DEPARTMENT_OTHER): Payer: Medicare Other

## 2012-03-02 ENCOUNTER — Other Ambulatory Visit: Payer: Medicare Other | Admitting: Lab

## 2012-03-02 ENCOUNTER — Other Ambulatory Visit: Payer: Self-pay | Admitting: Medical Oncology

## 2012-03-02 ENCOUNTER — Encounter (HOSPITAL_COMMUNITY)
Admission: RE | Admit: 2012-03-02 | Discharge: 2012-03-02 | Disposition: A | Discharge status: Home / Self Care | Payer: Medicare Other | Source: Ambulatory Visit | Attending: Internal Medicine | Admitting: Internal Medicine

## 2012-03-02 VITALS — BP 158/68 | HR 69 | Temp 97.6°F | Resp 20

## 2012-03-02 DIAGNOSIS — D709 Neutropenia, unspecified: Secondary | ICD-10-CM

## 2012-03-02 DIAGNOSIS — C3490 Malignant neoplasm of unspecified part of unspecified bronchus or lung: Secondary | ICD-10-CM

## 2012-03-02 DIAGNOSIS — D649 Anemia, unspecified: Secondary | ICD-10-CM | POA: Insufficient documentation

## 2012-03-02 LAB — COMPREHENSIVE METABOLIC PANEL (CC13)
ALT: 15 U/L (ref 0–55)
AST: 28 U/L (ref 5–34)
CO2: 26 mEq/L (ref 22–29)
Calcium: 9.4 mg/dL (ref 8.4–10.4)
Chloride: 107 mEq/L (ref 98–107)
Sodium: 142 mEq/L (ref 136–145)
Total Protein: 6.9 g/dL (ref 6.4–8.3)

## 2012-03-02 LAB — CBC WITH DIFFERENTIAL/PLATELET
Eosinophils Absolute: 0 10*3/uL (ref 0.0–0.5)
MONO#: 0.3 10*3/uL (ref 0.1–0.9)
NEUT#: 0.3 10*3/uL — CL (ref 1.5–6.5)
RBC: 2.39 10*6/uL — ABNORMAL LOW (ref 3.70–5.45)
RDW: 16.2 % — ABNORMAL HIGH (ref 11.2–14.5)
WBC: 1.3 10*3/uL — ABNORMAL LOW (ref 3.9–10.3)
lymph#: 0.7 10*3/uL — ABNORMAL LOW (ref 0.9–3.3)
nRBC: 0 % (ref 0–0)

## 2012-03-02 MED ORDER — FILGRASTIM 300 MCG/0.5ML IJ SOLN
300.0000 ug | Freq: Once | INTRAMUSCULAR | Status: DC
  Filled 2012-03-02: qty 0.5

## 2012-03-02 MED ORDER — ACETAMINOPHEN 325 MG PO TABS
650.0000 mg | ORAL_TABLET | Freq: Once | ORAL | Status: AC
  Administered 2012-03-02: 650 mg via ORAL

## 2012-03-02 MED ORDER — DIPHENHYDRAMINE HCL 50 MG/ML IJ SOLN
25.0000 mg | Freq: Once | INTRAMUSCULAR | Status: AC
  Administered 2012-03-02: 25 mg via INTRAVENOUS

## 2012-03-02 MED ORDER — FILGRASTIM 300 MCG/0.5ML IJ SOLN
300.0000 ug | Freq: Once | INTRAMUSCULAR | Status: DC
  Administered 2012-03-02: 300 ug via SUBCUTANEOUS
  Filled 2012-03-02: qty 0.5

## 2012-03-02 NOTE — Progress Notes (Signed)
Quick Note:  Call patient with the result and give neutropenic precautions. ______ 

## 2012-03-02 NOTE — Patient Instructions (Addendum)
Blood Transfusion Information WHAT IS A BLOOD TRANSFUSION? A transfusion is the replacement of blood or some of its parts. Blood is made up of multiple cells which provide different functions.  Red blood cells carry oxygen and are used for blood loss replacement.  White blood cells fight against infection.  Platelets control bleeding.  Plasma helps clot blood.  Other blood products are available for specialized needs, such as hemophilia or other clotting disorders. BEFORE THE TRANSFUSION  Who gives blood for transfusions?   You may be able to donate blood to be used at a later date on yourself (autologous donation).  Relatives can be asked to donate blood. This is generally not any safer than if you have received blood from a stranger. The same precautions are taken to ensure safety when a relative's blood is donated.  Healthy volunteers who are fully evaluated to make sure their blood is safe. This is blood bank blood. Transfusion therapy is the safest it has ever been in the practice of medicine. Before blood is taken from a donor, a complete history is taken to make sure that person has no history of diseases nor engages in risky social behavior (examples are intravenous drug use or sexual activity with multiple partners). The donor's travel history is screened to minimize risk of transmitting infections, such as malaria. The donated blood is tested for signs of infectious diseases, such as HIV and hepatitis. The blood is then tested to be sure it is compatible with you in order to minimize the chance of a transfusion reaction. If you or a relative donates blood, this is often done in anticipation of surgery and is not appropriate for emergency situations. It takes many days to process the donated blood. RISKS AND COMPLICATIONS Although transfusion therapy is very safe and saves many lives, the main dangers of transfusion include:   Getting an infectious disease.  Developing a  transfusion reaction. This is an allergic reaction to something in the blood you were given. Every precaution is taken to prevent this. The decision to have a blood transfusion has been considered carefully by your caregiver before blood is given. Blood is not given unless the benefits outweigh the risks. AFTER THE TRANSFUSION  Right after receiving a blood transfusion, you will usually feel much better and more energetic. This is especially true if your red blood cells have gotten low (anemic). The transfusion raises the level of the red blood cells which carry oxygen, and this usually causes an energy increase.  The nurse administering the transfusion will monitor you carefully for complications. HOME CARE INSTRUCTIONS  No special instructions are needed after a transfusion. You may find your energy is better. Speak with your caregiver about any limitations on activity for underlying diseases you may have. SEEK MEDICAL CARE IF:   Your condition is not improving after your transfusion.  You develop redness or irritation at the intravenous (IV) site. SEEK IMMEDIATE MEDICAL CARE IF:  Any of the following symptoms occur over the next 12 hours:  Shaking chills.  You have a temperature by mouth above 102 F (38.9 C), not controlled by medicine.  Chest, back, or muscle pain.  People around you feel you are not acting correctly or are confused.  Shortness of breath or difficulty breathing.  Dizziness and fainting.  You get a rash or develop hives.  You have a decrease in urine output.  Your urine turns a dark color or changes to pink, red, or brown. Any of the following   symptoms occur over the next 10 days:  You have a temperature by mouth above 102 F (38.9 C), not controlled by medicine.  Shortness of breath.  Weakness after normal activity.  The white part of the eye turns yellow (jaundice).  You have a decrease in the amount of urine or are urinating less often.  Your  urine turns a dark color or changes to pink, red, or brown. Document Released: 04/03/2000 Document Revised: 06/29/2011 Document Reviewed: 11/21/2007 [facility] Patient Information 2013 [place], [place].    Neutropenia Neutropenia is a condition that occurs when the level of a certain type of white blood cell (neutrophil) in your body becomes lower than normal. Neutrophils are made in the bone marrow and fight infections. These cells protect against bacteria and viruses. The fewer neutrophils you have, and the longer your body remains without them, the greater your risk of getting a severe infection becomes. CAUSES  The cause of neutropenia may be hard to determine. However, it is usually due to 3 main problems:   Decreased production of neutrophils. This may be due to:  Certain medicines such as chemotherapy.  Genetic problems.  Cancer.  Radiation treatments.  Vitamin deficiency.  Some pesticides.  Increased destruction of neutrophils. This may be due to:  Overwhelming infections.  Hemolytic anemia. This is when the body destroys its own blood cells.  Chemotherapy.  Neutrophils moving to areas of the body where they cannot fight infections. This may be due to:  Dialysis procedures.  Conditions where the spleen becomes enlarged. Neutrophils are held in the spleen and are not available to the rest of the body.  Overwhelming infections. The neutrophils are held in the area of the infection and are not available to the rest of the body. SYMPTOMS  There are no specific symptoms of neutropenia. The lack of neutrophils can result in an infection, and an infection can cause various problems. DIAGNOSIS  Diagnosis is made by a blood test. A complete blood count is performed. The normal level of neutrophils in human blood differs with age and race. Infants have lower counts than older children and adults. African Americans have lower counts than Caucasians or Asians. The average adult  level is 1500 cells/mm3 of blood. Neutrophil counts are interpreted as follows:  Greater than 1000 cells/mm3 gives normal protection against infection.  500 to 1000 cells/mm3 gives an increased risk for infection.  200 to 500 cells/mm3 is a greater risk for severe infection.  Lower than 200 cells/mm3 is a marked risk of infection. This may require hospitalization and treatment with antibiotic medicines. TREATMENT  Treatment depends on the underlying cause, severity, and presence of infections or symptoms. It also depends on your health. Your caregiver will discuss the treatment plan with you. Mild cases are often easily treated and have a good outcome. Preventative measures may also be started to limit your risk of infections. Treatment can include:  Taking antibiotics.  Stopping medicines that are known to cause neutropenia.  Correcting nutritional deficiencies by eating green vegetables to supply folic acid and taking vitamin B supplements.  Stopping exposure to pesticides if your neutropenia is related to pesticide exposure.  Taking a blood growth factor called sargramostim, pegfilgrastim, or filgrastim if you are undergoing chemotherapy for cancer. This stimulates white blood cell production.  Removal of the spleen if you have Felty's syndrome and have repeated infections. HOME CARE INSTRUCTIONS   Follow your caregiver's instructions about when you need to have blood work done.  Wash your hands  often. Make sure others who come in contact with you also wash their hands.  Wash raw fruits and vegetables before eating them. They can carry bacteria and fungi.  Avoid people with colds or spreadable (contagious) diseases (chickenpox, herpes zoster, influenza).  Avoid large crowds.  Avoid construction areas. The dust can release fungus into the air.  Be cautious around children in daycare or school environments.  Take care of your respiratory system by coughing and deep  breathing.  Bathe daily.  Protect your skin from cuts and burns.  Do not work in the garden or with flowers and plants.  Care for the mouth before and after meals by brushing with a soft toothbrush. If you have mucositis, do not use mouthwash. Mouthwash contains alcohol and can dry out the mouth even more.  Clean the area between the genitals and the anus (perineal area) after urination and bowel movements. Women need to wipe from front to back.  Use a water soluble lubricant during sexual intercourse and practice good hygiene after. Do not have intercourse if you are severely neutropenic. Check with your caregiver for guidelines.  Exercise daily as tolerated.  Avoid people who were vaccinated with a live vaccine in the past 30 days. You should not receive live vaccines (polio, typhoid).  Do not provide direct care for pets. Avoid animal droppings. Do not clean litter boxes and bird cages.  Do not share food utensils.  Do not use tampons, enemas, or rectal suppositories unless directed by your caregiver.  Use an electric razor to remove hair.  Wash your hands after handling magazines, letters, and newspapers. SEEK IMMEDIATE MEDICAL CARE IF:   You have a fever.  You have chills or start to shake.  You feel nauseous or vomit.  You develop mouth sores.  You develop aches and pains.  You have redness and swelling around open wounds.  Your skin is warm to the touch.  You have pus coming from your wounds.  You develop swollen lymph nodes.  You feel weak or fatigued.  You develop red streaks on the skin. MAKE SURE YOU:  Understand these instructions.  Will watch your condition.  Will get help right away if you are not doing well or get worse. Document Released: 09/26/2001 Document Revised: 06/29/2011 Document Reviewed: 10/24/2010 [facility] Patient Information 2013 [place], [place].

## 2012-03-02 NOTE — Telephone Encounter (Signed)
Pt needs transfusion and neutrapenic precautions. I left message for pt to call to schedule type and cross and transfusion

## 2012-03-02 NOTE — Telephone Encounter (Signed)
appt made and printed for pt  °

## 2012-03-02 NOTE — Progress Notes (Signed)
This is an 78 year old female patient diagnosed with metastatic non-small cell lung cancer receiving systemic chemotherapy.  History includes hyperlipidemia, gout, tobacco, COPD, hypertension, osteopenia, and hiatal hernia.  Medications include Xanax, B complex vitamin, vitamin D, omega-3 fatty acids, folic acid, Ativan, multivitamin, Zofran, and prednisone.  Labs were reviewed.  Height: 61 inches. Weight: 155.2 pounds. Usual body weight 162 pounds. BMI: 29.34.  Patient reports a good appetite however she does have nausea and taste alterations limiting her ability to tolerate oral diet.  Nutrition diagnosis: Food and nutrition related knowledge deficit related to diagnosis of metastatic lung cancer and associated treatments as evidenced by no prior need for nutrition related information.  Intervention: I educated patient and her daughter on strategies for eating with nausea. I've recommended smaller amount  of food more often and being sure to include a protein source at every meal and snack. I have reviewed appropriate snacks for patient as well as examples of meals. I've encouraged her to take her nausea medicine she states she does have nausea but she's not taking medication for it. I've also educated her on taste alterations. I provided fact sheets and contact information for patient to contact me with further questions.   Monitoring, evaluation, goals: Patient will tolerate adequate calories and protein to minimize weight loss throughout treatment.  Next visit: Patient will contact me with any questions or concerns.

## 2012-03-02 NOTE — Progress Notes (Signed)
Pt contacted and coming in today for lab and blood transfusion

## 2012-03-03 ENCOUNTER — Ambulatory Visit (HOSPITAL_BASED_OUTPATIENT_CLINIC_OR_DEPARTMENT_OTHER): Payer: Medicare Other

## 2012-03-03 ENCOUNTER — Other Ambulatory Visit: Payer: Self-pay | Admitting: Medical Oncology

## 2012-03-03 VITALS — BP 152/76 | HR 74 | Temp 98.7°F

## 2012-03-03 DIAGNOSIS — D709 Neutropenia, unspecified: Secondary | ICD-10-CM

## 2012-03-03 LAB — TYPE AND SCREEN: Unit division: 0

## 2012-03-03 MED ORDER — FILGRASTIM 300 MCG/0.5ML IJ SOLN
300.0000 ug | Freq: Once | INTRAMUSCULAR | Status: AC
  Administered 2012-03-03: 300 ug via SUBCUTANEOUS
  Filled 2012-03-03: qty 0.5

## 2012-03-08 ENCOUNTER — Telehealth: Payer: Self-pay | Admitting: *Deleted

## 2012-03-08 ENCOUNTER — Other Ambulatory Visit: Payer: Self-pay | Admitting: Internal Medicine

## 2012-03-08 ENCOUNTER — Other Ambulatory Visit: Payer: Self-pay | Admitting: *Deleted

## 2012-03-08 ENCOUNTER — Ambulatory Visit (HOSPITAL_BASED_OUTPATIENT_CLINIC_OR_DEPARTMENT_OTHER): Payer: Medicare Other

## 2012-03-08 VITALS — BP 118/74 | HR 86 | Temp 98.2°F | Resp 18

## 2012-03-08 DIAGNOSIS — C3490 Malignant neoplasm of unspecified part of unspecified bronchus or lung: Secondary | ICD-10-CM

## 2012-03-08 DIAGNOSIS — C7970 Secondary malignant neoplasm of unspecified adrenal gland: Secondary | ICD-10-CM

## 2012-03-08 DIAGNOSIS — C342 Malignant neoplasm of middle lobe, bronchus or lung: Secondary | ICD-10-CM

## 2012-03-08 MED ORDER — ONDANSETRON 8 MG/50ML IVPB (CHCC)
8.0000 mg | Freq: Once | INTRAVENOUS | Status: AC
  Administered 2012-03-08: 8 mg via INTRAVENOUS

## 2012-03-08 MED ORDER — SODIUM CHLORIDE 0.9 % IV SOLN
1000.0000 mL | INTRAVENOUS | Status: DC
  Administered 2012-03-08: 1000 mL via INTRAVENOUS

## 2012-03-08 MED ORDER — LORAZEPAM 2 MG/ML IJ SOLN
0.5000 mg | Freq: Once | INTRAMUSCULAR | Status: AC
  Administered 2012-03-08: 0.5 mg via INTRAVENOUS

## 2012-03-08 NOTE — Patient Instructions (Signed)
Dehydration, Adult Dehydration is when you lose more fluids from the body than you take in. Vital organs like the kidneys, brain, and heart cannot function without a proper amount of fluids and salt. Any loss of fluids from the body can cause dehydration.  CAUSES   Vomiting.  Diarrhea.  Excessive sweating.  Excessive urine output.  Fever. SYMPTOMS  Mild dehydration  Thirst.  Dry lips.  Slightly dry mouth. Moderate dehydration  Very dry mouth.  Sunken eyes.  Skin does not bounce back quickly when lightly pinched and released.  Dark urine and decreased urine production.  Decreased tear production.  Headache. Severe dehydration  Very dry mouth.  Extreme thirst.  Rapid, weak pulse (more than 100 beats per minute at rest).  Cold hands and feet.  Not able to sweat in spite of heat and temperature.  Rapid breathing.  Blue lips.  Confusion and lethargy.  Difficulty being awakened.  Minimal urine production.  No tears. DIAGNOSIS  Your caregiver will diagnose dehydration based on your symptoms and your exam. Blood and urine tests will help confirm the diagnosis. The diagnostic evaluation should also identify the cause of dehydration. TREATMENT  Treatment of mild or moderate dehydration can often be done at home by increasing the amount of fluids that you drink. It is best to drink small amounts of fluid more often. Drinking too much at one time can make vomiting worse. Refer to the home care instructions below. Severe dehydration needs to be treated at the hospital where you will probably be given intravenous (IV) fluids that contain water and electrolytes. HOME CARE INSTRUCTIONS   Ask your caregiver about specific rehydration instructions.  Drink enough fluids to keep your urine clear or pale yellow.  Drink small amounts frequently if you have nausea and vomiting.  Eat as you normally do.  Avoid:  Foods or drinks high in sugar.  Carbonated  drinks.  Juice.  Extremely hot or cold fluids.  Drinks with caffeine.  Fatty, greasy foods.  Alcohol.  Tobacco.  Overeating.  Gelatin desserts.  Wash your hands well to avoid spreading bacteria and viruses.  Only take over-the-counter or prescription medicines for pain, discomfort, or fever as directed by your caregiver.  Ask your caregiver if you should continue all prescribed and over-the-counter medicines.  Keep all follow-up appointments with your caregiver. SEEK MEDICAL CARE IF:  You have abdominal pain and it increases or stays in one area (localizes).  You have a rash, stiff neck, or severe headache.  You are irritable, sleepy, or difficult to awaken.  You are weak, dizzy, or extremely thirsty. SEEK IMMEDIATE MEDICAL CARE IF:   You are unable to keep fluids down or you get worse despite treatment.  You have frequent episodes of vomiting or diarrhea.  You have blood or green matter (bile) in your vomit.  You have blood in your stool or your stool looks black and tarry.  You have not urinated in 6 to 8 hours, or you have only urinated a small amount of very dark urine.  You have a fever.  You faint. MAKE SURE YOU:   Understand these instructions.  Will watch your condition.  Will get help right away if you are not doing well or get worse. Document Released: 04/06/2005 Document Revised: 06/29/2011 Document Reviewed: 11/24/2010 [employer] Patient Information 2013 [employer].  

## 2012-03-08 NOTE — Progress Notes (Signed)
1546 Patient dry heaving, and complaining of nausea.  Tiana Loft, PA notified. Order for 0.5 mg Lorazepam IV push given and carried out.   1645 Patient tolerated Ginger Ale and states nausea is resolved.

## 2012-03-08 NOTE — Telephone Encounter (Signed)
Pt's daughter called stating that for the past 24 hours pt has been having c/o nausea and vomiting uncontrolled by lorazepam and zofran ODT.  This morning she drank an ensure and "it ran right through her".  [name] is worried pt is dehydrated.  Per Dr Donnald Garre, pt needs IVF and antiemetics IV.  Pt's daughter aware of appt today.  SLJ

## 2012-03-09 ENCOUNTER — Encounter: Payer: Self-pay | Admitting: *Deleted

## 2012-03-09 ENCOUNTER — Ambulatory Visit (HOSPITAL_BASED_OUTPATIENT_CLINIC_OR_DEPARTMENT_OTHER): Payer: Medicare Other | Admitting: Physician Assistant

## 2012-03-09 ENCOUNTER — Encounter: Payer: Self-pay | Admitting: Physician Assistant

## 2012-03-09 ENCOUNTER — Telehealth: Payer: Self-pay | Admitting: Internal Medicine

## 2012-03-09 ENCOUNTER — Other Ambulatory Visit: Payer: Medicare Other | Admitting: Lab

## 2012-03-09 ENCOUNTER — Other Ambulatory Visit (HOSPITAL_BASED_OUTPATIENT_CLINIC_OR_DEPARTMENT_OTHER): Payer: Medicare Other | Admitting: Lab

## 2012-03-09 ENCOUNTER — Telehealth: Payer: Self-pay | Admitting: *Deleted

## 2012-03-09 ENCOUNTER — Ambulatory Visit (HOSPITAL_BASED_OUTPATIENT_CLINIC_OR_DEPARTMENT_OTHER): Payer: Medicare Other

## 2012-03-09 ENCOUNTER — Ambulatory Visit: Payer: Medicare Other | Admitting: Internal Medicine

## 2012-03-09 VITALS — BP 149/86 | HR 81 | Temp 96.9°F | Ht 61.0 in | Wt 153.9 lb

## 2012-03-09 DIAGNOSIS — C3490 Malignant neoplasm of unspecified part of unspecified bronchus or lung: Secondary | ICD-10-CM

## 2012-03-09 DIAGNOSIS — C779 Secondary and unspecified malignant neoplasm of lymph node, unspecified: Secondary | ICD-10-CM

## 2012-03-09 DIAGNOSIS — C7970 Secondary malignant neoplasm of unspecified adrenal gland: Secondary | ICD-10-CM

## 2012-03-09 DIAGNOSIS — Z5111 Encounter for antineoplastic chemotherapy: Secondary | ICD-10-CM

## 2012-03-09 DIAGNOSIS — C342 Malignant neoplasm of middle lobe, bronchus or lung: Secondary | ICD-10-CM

## 2012-03-09 LAB — CBC WITH DIFFERENTIAL/PLATELET
EOS%: 0 % (ref 0.0–7.0)
Eosinophils Absolute: 0 10*3/uL (ref 0.0–0.5)
MCV: 96.7 fL (ref 79.5–101.0)
MONO%: 14.4 % — ABNORMAL HIGH (ref 0.0–14.0)
NEUT#: 6.4 10*3/uL (ref 1.5–6.5)
RBC: 3.65 10*6/uL — ABNORMAL LOW (ref 3.70–5.45)
RDW: 17.3 % — ABNORMAL HIGH (ref 11.2–14.5)
nRBC: 0 % (ref 0–0)

## 2012-03-09 LAB — COMPREHENSIVE METABOLIC PANEL (CC13)
ALT: 14 U/L (ref 0–55)
Alkaline Phosphatase: 99 U/L (ref 40–150)
CO2: 24 mEq/L (ref 22–29)
Sodium: 140 mEq/L (ref 136–145)
Total Bilirubin: 0.59 mg/dL (ref 0.20–1.20)
Total Protein: 7.2 g/dL (ref 6.4–8.3)

## 2012-03-09 MED ORDER — DEXAMETHASONE SODIUM PHOSPHATE 4 MG/ML IJ SOLN
20.0000 mg | Freq: Once | INTRAMUSCULAR | Status: AC
  Administered 2012-03-09: 20 mg via INTRAVENOUS

## 2012-03-09 MED ORDER — SODIUM CHLORIDE 0.9 % IV SOLN
Freq: Once | INTRAVENOUS | Status: AC
  Administered 2012-03-09: 12:00:00 via INTRAVENOUS

## 2012-03-09 MED ORDER — SODIUM CHLORIDE 0.9 % IV SOLN
500.0000 mg/m2 | Freq: Once | INTRAVENOUS | Status: AC
  Administered 2012-03-09: 900 mg via INTRAVENOUS
  Filled 2012-03-09: qty 36

## 2012-03-09 MED ORDER — SODIUM CHLORIDE 0.9 % IV SOLN
381.0000 mg | Freq: Once | INTRAVENOUS | Status: AC
  Administered 2012-03-09: 380 mg via INTRAVENOUS
  Filled 2012-03-09: qty 38

## 2012-03-09 MED ORDER — ONDANSETRON 16 MG/50ML IVPB (CHCC)
16.0000 mg | Freq: Once | INTRAVENOUS | Status: AC
  Administered 2012-03-09: 16 mg via INTRAVENOUS

## 2012-03-09 NOTE — Telephone Encounter (Signed)
Emailed michelle to add chemo for pt...the patient aware....gv and printed pt appt schedule for NOV and DEC

## 2012-03-09 NOTE — Telephone Encounter (Signed)
Per staff message and POF I have scheduled appts.  JMW  

## 2012-03-09 NOTE — Progress Notes (Signed)
[facility]  Clinical Social Work  Clinical Social Work met with patient in infusion room today to complete healthcare advance directives. [name] has designated Daivd Council as her primary healthcare POA and [name] DeDreux and Roylene Reason as alternate healthcare POA's.  The patient has also designated that she does not want life prolonging measures in situations expressed in her healthcare living will.  To review patient's advance directives, view under Chart Review>Media Tab> filter under Advance Directives.  CSW made copies for patient and will send copy to medical records to be scanned into patient's chart. CSW encouraged patient to contact CSW with additional questions or concerns.  Kathrin Penner, MSW, LCSW Clinical Social Worker [facility] [phone]

## 2012-03-09 NOTE — Patient Instructions (Addendum)
Follow up in 3 weeks prior to your next cycle of chemotherapy 

## 2012-03-09 NOTE — Patient Instructions (Addendum)
[facility] Discharge Instructions for Patients Receiving Chemotherapy  Today you received the following chemotherapy agents alimta/ carboplatin  To help prevent nausea and vomiting after your treatment, we encourage you to take your nausea medication  and take it as often as prescribed   If you develop nausea and vomiting that is not controlled by your nausea medication, call the clinic. If it is after clinic hours your family physician or the after hours number for the clinic or go to the Emergency Department.   BELOW ARE SYMPTOMS THAT SHOULD BE REPORTED IMMEDIATELY:  *FEVER GREATER THAN 100.5 F  *CHILLS WITH OR WITHOUT FEVER  NAUSEA AND VOMITING THAT IS NOT CONTROLLED WITH YOUR NAUSEA MEDICATION  *UNUSUAL SHORTNESS OF BREATH  *UNUSUAL BRUISING OR BLEEDING  TENDERNESS IN MOUTH AND THROAT WITH OR WITHOUT PRESENCE OF ULCERS  *URINARY PROBLEMS  *BOWEL PROBLEMS  UNUSUAL RASH Items with * indicate a potential emergency and should be followed up as soon as possible.  One of the nurses will contact you 24 hours after your treatment. Please let the nurse know about any problems that you may have experienced. Feel free to call the clinic you have any questions or concerns. The clinic phone number is [phone].   I have been informed and understand all the instructions given to me. I know to contact the clinic, my physician, or go to the Emergency Department if any problems should occur. I do not have any questions at this time, but understand that I may call the clinic during office hours   should I have any questions or need assistance in obtaining follow up care.    __________________________________________  _____________  __________ Signature of Patient or Authorized Representative            Date                   Time    __________________________________________ Nurse's Signature    

## 2012-03-14 ENCOUNTER — Other Ambulatory Visit: Payer: Self-pay | Admitting: Medical Oncology

## 2012-03-14 ENCOUNTER — Telehealth: Payer: Self-pay | Admitting: Medical Oncology

## 2012-03-14 ENCOUNTER — Ambulatory Visit (HOSPITAL_BASED_OUTPATIENT_CLINIC_OR_DEPARTMENT_OTHER): Payer: Medicare Other | Admitting: Lab

## 2012-03-14 ENCOUNTER — Ambulatory Visit (HOSPITAL_BASED_OUTPATIENT_CLINIC_OR_DEPARTMENT_OTHER): Payer: Medicare Other

## 2012-03-14 VITALS — BP 121/79 | HR 91 | Temp 97.4°F | Resp 18

## 2012-03-14 DIAGNOSIS — E860 Dehydration: Secondary | ICD-10-CM

## 2012-03-14 DIAGNOSIS — C3490 Malignant neoplasm of unspecified part of unspecified bronchus or lung: Secondary | ICD-10-CM

## 2012-03-14 LAB — BASIC METABOLIC PANEL
Calcium: 9.4 mg/dL (ref 8.4–10.5)
Sodium: 137 mEq/L (ref 135–145)

## 2012-03-14 LAB — CBC WITH DIFFERENTIAL/PLATELET
Basophils Absolute: 0 10*3/uL (ref 0.0–0.1)
EOS%: 0.3 % (ref 0.0–7.0)
HCT: 38.1 % (ref 34.8–46.6)
HGB: 13.1 g/dL (ref 11.6–15.9)
MCH: 32.4 pg (ref 25.1–34.0)
MCV: 94.3 fL (ref 79.5–101.0)
MONO%: 1 % (ref 0.0–14.0)
NEUT%: 78.6 % — ABNORMAL HIGH (ref 38.4–76.8)

## 2012-03-14 MED ORDER — ONDANSETRON 8 MG/50ML IVPB (CHCC)
8.0000 mg | Freq: Once | INTRAVENOUS | Status: AC
  Administered 2012-03-14: 8 mg via INTRAVENOUS

## 2012-03-14 MED ORDER — SODIUM CHLORIDE 0.9 % IV SOLN: INTRAVENOUS | Status: DC

## 2012-03-14 MED ORDER — SODIUM CHLORIDE 0.9 % IV SOLN
Freq: Once | INTRAVENOUS | Status: AC
  Administered 2012-03-14: 16:00:00 via INTRAVENOUS

## 2012-03-14 MED ORDER — ONDANSETRON 8 MG/50ML IVPB (CHCC): 8.0000 mg | Freq: Once | INTRAVENOUS | Status: DC

## 2012-03-14 NOTE — Telephone Encounter (Signed)
Per Dr Arbutus Ped bring pt in for labs. zofran and IVF-dtr given appt times

## 2012-03-14 NOTE — Progress Notes (Signed)
[facility] Telephone:([phone]   [phone]  OFFICE PROGRESS NOTE  Teresa Dubonnet, MD [address] [place] [address]  DIAGNOSIS: Metastatic non-small cell lung cancer, adenocarcinoma with negative EGFR mutation and negative ALK gene translocation diagnosed in August of 2013   PRIOR THERAPY: None.   CURRENT THERAPY: Systemic chemotherapy with carboplatin for AUC of 5 and Alimta 500 mg/M2 every 3 weeks. The patient is status post 4 cycles.  INTERVAL HISTORY: [name] 78 y.o. female returns to the clinic today for followup visit accompanied by her daughter. She received IV fluids and IV antiemetics yesterday for some persistent nausea and vomiting. She feels it began after eating some fried and he brings in a hamburger at a restaurant. She did have some queasiness prior to that that was preceded by some pains in her stomach and some diarrhea. The symptoms have subsided. She is due cycle 5 of her chemotherapy with carboplatin and Alimta today and does feel up to proceeding with cycle #5 today as scheduled. The patient is feeling fine and tolerated the last cycle of her systemic chemotherapy fairly well except for mild fatigue 2-4 days after her treatment as well as some nausea vomiting and diarrhea.. She denied having any significant chest pain, shortness breath, cough or hemoptysis.  No significant weight loss.   MEDICAL HISTORY: Past Medical History  Diagnosis Date  . Cervical spondylosis   . Renal cyst     simple bilateral 2008  . Diverticulosis   . Vertigo, benign positional   . Hyperlipidemia   . Aortic sclerosis     by exam,  . Erosive osteoarthritis of hand   . History of calcium pyrophosphate deposition disease (CPPD)   . Gout     vs pseudo gout flares in past  . Mediastinal adenopathy   . Tobacco use   . COPD, mild   . Glaucoma   . Hypertension   . Osteopenia   . Hiatal hernia   . Hemorrhoids   . H/O urinary  incontinence   . Childhood asthma   . Bronchitis   . TIA (transient ischemic attack)   . Claustrophobia   . Dizziness   . Bleeding from the nose     hx of severe nose bleeds-last time was 2 yrs ago, slight bleed last week    ALLERGIES:  is allergic to shellfish allergy; septra ds; statins; tape; and wellbutrin.  MEDICATIONS:  Current Outpatient Prescriptions  Medication Sig Dispense Refill  . ADVAIR DISKUS 250-50 MCG/DOSE AEPB       . ALPRAZolam (XANAX) 0.25 MG tablet       . aspirin 81 MG tablet Take 81 mg by mouth daily.      . B Complex-C (B-COMPLEX WITH VITAMIN C) tablet Take 1 tablet by mouth daily.      . Cholecalciferol (VITAMIN D) 2000 UNITS tablet Take 2,000 Units by mouth daily.      . colchicine 0.6 MG tablet Take 0.6 mg by mouth daily as needed. For gout flare      . dexamethasone (DECADRON) 4 MG tablet       . diclofenac sodium (VOLTAREN) 1 % GEL Apply 1 application topically 4 (four) times daily.       Marland Kitchen diltiazem 2 % GEL Apply 1 application topically 4 (four) times daily.       Marland Kitchen estrogens, conjugated, (PREMARIN) 0.625 MG tablet Take 0.625 mg by mouth daily. Take daily for  21 days then do not take for 7 days.      . fish oil-omega-3 fatty acids 1000 MG capsule Take 1 g by mouth 2 (two) times daily.       . folic acid (FOLVITE) 1 MG tablet Take 1 tablet (1 mg total) by mouth daily.  30 tablet  4  . LORazepam (ATIVAN) 0.5 MG tablet Take 1 tablet by mouth or sublingually every 8 hours as needed for nausea  30 tablet  0  . methocarbamol (ROBAXIN) 500 MG tablet       . Multiple Vitamin (MULTIVITAMIN WITH MINERALS) TABS Take 1 tablet by mouth daily.      . nebivolol (BYSTOLIC) 5 MG tablet Take 5 mg by mouth daily with supper.       . ondansetron (ZOFRAN-ODT) 8 MG disintegrating tablet TAKE 1 TABLET UNDER TONGUE EVERY 6 TO 8 HOURS AS NEEDED FOR NAUSEA/VOMITING  30 tablet  1  . OVER THE COUNTER MEDICATION Take 375 mg by mouth 2 (two) times daily. "Mega Beta Sitosterol 375 mg"       . OVER THE COUNTER MEDICATION Take 3 capsules by mouth daily as needed. "Tart Cherry Ultra"; for gout flare      . predniSONE (DELTASONE) 5 MG tablet       . prochlorperazine (COMPAZINE) 10 MG tablet Take 1 tablet (10 mg total) by mouth every 6 (six) hours as needed.  60 tablet  0  . timolol (TIMOPTIC-XR) 0.5 % ophthalmic gel-forming Place 1 drop into the left eye daily.        No current facility-administered medications for this visit.   Facility-Administered Medications Ordered in Other Visits  Medication Dose Route Frequency Provider Last Rate Last Dose  . [COMPLETED] 0.9 %  sodium chloride infusion   Intravenous Once Si Gaul, MD      . [COMPLETED] ondansetron [facility]) IVPB 8 mg  8 mg Intravenous Once Si Gaul, MD   8 mg at 03/14/12 1550  . [DISCONTINUED] 0.9 %  sodium chloride infusion   Intravenous Continuous Si Gaul, MD      . [DISCONTINUED] ondansetron (ZOFRAN) IVPB 8 mg  8 mg Intravenous Once Si Gaul, MD        SURGICAL HISTORY:  Past Surgical History  Procedure Date  . Cystoscopy, transurethral collagen injection therapy   . Left total knee arthroplasty   . Appendectomy   . Hemorrhoid surgery   . Cataract surgery x 2   . Eye surgery   . Tonsillectomy   . Abdominal hysterectomy   . Colonoscopy     REVIEW OF SYSTEMS:  Pertinent items are noted in HPI.   PHYSICAL EXAMINATION: General appearance: alert, cooperative and no distress Head: Normocephalic, without obvious abnormality, atraumatic Neck: no adenopathy Lymph nodes: Cervical, supraclavicular, and axillary nodes normal. Resp: clear to auscultation bilaterally Cardio: regular rate and rhythm, S1, S2 normal, no murmur, click, rub or gallop GI: soft, non-tender; bowel sounds normal; no masses,  no organomegaly Extremities: extremities normal, atraumatic, no cyanosis or edema Neurologic: Alert and oriented X 3, normal strength and tone. Normal symmetric reflexes. Normal coordination  and gait  ECOG PERFORMANCE STATUS: 1 - Symptomatic but completely ambulatory  Blood pressure 149/86, pulse 81, temperature 96.9 F (36.1 C), temperature source Oral, height 5\' 1"  (1.549 m), weight 153 lb 14.4 oz (69.809 kg).  LABORATORY DATA: Lab Results  Component Value Date   WBC 6.8 03/14/2012   HGB 13.1 03/14/2012   HCT 38.1 03/14/2012   MCV  94.3 03/14/2012   PLT 227 03/14/2012      Chemistry      Component Value Date/Time   NA 137 03/14/2012 1617   NA 140 03/09/2012 0956   K 3.8 03/14/2012 1617   K 4.4 03/09/2012 0956   CL 98 03/14/2012 1617   CL 107 03/09/2012 0956   CO2 24 03/14/2012 1617   CO2 24 03/09/2012 0956   BUN 38* 03/14/2012 1617   BUN 16.0 03/09/2012 0956   CREATININE 1.75* 03/14/2012 1617   CREATININE 1.0 03/09/2012 0956      Component Value Date/Time   CALCIUM 9.4 03/14/2012 1617   CALCIUM 9.0 03/09/2012 0956   ALKPHOS 99 03/09/2012 0956   ALKPHOS 82 11/26/2011 1041   AST 33 03/09/2012 0956   AST 25 11/26/2011 1041   ALT 14 03/09/2012 0956   ALT 15 11/26/2011 1041   BILITOT 0.59 03/09/2012 0956   BILITOT 0.3 11/26/2011 1041       RADIOGRAPHIC STUDIES: Ct Chest W Contrast  02/11/2012  *RADIOLOGY REPORT*  Clinical Data:  Mediastinal adenopathy.  Metastatic non-small lung cancer diagnosed on biopsy.  Patient status post two cycles chemotherapy as of 02/01/2012  CT CHEST, ABDOMEN AND PELVIS WITH CONTRAST  Technique:  Multidetector CT imaging of the chest, abdomen and pelvis was performed following the standard protocol during bolus administration of intravenous contrast.  Contrast: OMNIPAQUE IOHEXOL 300 MG/ML  SOLN  Comparison:  PET CT scan 11/26/2011  CT CHEST  Findings:  No axillary lymphadenopathy.  Left supraclavicular node measures 5 mm (image #8) decreased from 11 mm on prior.  Bulky mediastinal lymphadenopathy has been reduced in volume.  For example, right lower paratracheal lymph node measures 15 mm short axis compared to 31 mm.  The largest  pulmonary nodule within the right upper lobe measures 33 x 26 mm compared to 29 x 23 mm on prior.  Nodule in the right middle lobe measures 16 mm x 17 mm compared to 16 mm x 17 mm on prior.  No new pulmonary nodules are present.  IMPRESSION:  1.  Decrease in size of mediastinal and left supraclavicular lymphadenopathy. 2.  Large right lung pulmonary nodules are not reduced in size.  CT ABDOMEN AND PELVIS  Findings:  No focal hepatic lesion.  Gallbladder, pancreas, spleen, and adrenal glands are normal.  There are cysts within the kidneys which are unchanged.  The stomach, small bowel, and proximal colon are normal.  There is mild inflammation surrounding the descending colon and the left pericolic gutter.  There are several diverticula through this region.  Abdominal aorta normal caliber.  No retroperitoneal periportal lymphadenopathy.  No free fluid the pelvis.  Post hysterectomy anatomy.  The bladder is normal.  No pelvic lymphadenopathy. A small cyst in the left ovary is stable.  Review of  bone windows demonstrates no aggressive osseous lesions.  IMPRESSION:  1.   No evidence of metastasis within the abdomen or pelvis. 2.  Mild inflammation associated the descending colon likely represents mild of acute diverticulitis.  3.  Stable small cyst within the left ovary.  Findings conveyed to Tiana Loft PA on 02/11/2012 at the 9:40 am   Original Report Authenticated By: Genevive Bi, M.D.     ASSESSMENT/PLAN: This is a very pleasant 78 years old white female with stage metastatic non-small cell lung cancer, , adenocarcinoma with negative EGFR mutation and negative ALK gene translocation. She is currently undergoing systemic chemotherapy with carboplatin and Alimta status post 4 cycles with  partial response especially in the mediastinal lymph nodes. The patient was discussed with Dr. Darrold Span in Dr. Asa Lente absence. She will proceed with cycle #5 of her systemic chemotherapy with carboplatin and Alimta as  scheduled. She'll continue with weekly labs consisting of a CBC differential and C. met. She has adequate antiemetics at home and understands how to take them. She'll followup in 3 weeks prior to cycle #6 of her systemic chemotherapy with a repeat CBC differential and C. met.  Laural Benes, Mlissa Tamayo E, PA-C   All questions were answered. The patient knows to call the clinic with any problems, questions or concerns. We can certainly see the patient much sooner if necessary.  I spent 20 minutes counseling the patient face to face. The total time spent in the appointment was 30 minutes.

## 2012-03-14 NOTE — Patient Instructions (Addendum)
Dehydration, Adult Dehydration is when you lose more fluids from the body than you take in. Vital organs like the kidneys, brain, and heart cannot function without a proper amount of fluids and salt. Any loss of fluids from the body can cause dehydration.  CAUSES   Vomiting.  Diarrhea.  Excessive sweating.  Excessive urine output.  Fever. SYMPTOMS  Mild dehydration  Thirst.  Dry lips.  Slightly dry mouth. Moderate dehydration  Very dry mouth.  Sunken eyes.  Skin does not bounce back quickly when lightly pinched and released.  Dark urine and decreased urine production.  Decreased tear production.  Headache. Severe dehydration  Very dry mouth.  Extreme thirst.  Rapid, weak pulse (more than 100 beats per minute at rest).  Cold hands and feet.  Not able to sweat in spite of heat and temperature.  Rapid breathing.  Blue lips.  Confusion and lethargy.  Difficulty being awakened.  Minimal urine production.  No tears. DIAGNOSIS  Your caregiver will diagnose dehydration based on your symptoms and your exam. Blood and urine tests will help confirm the diagnosis. The diagnostic evaluation should also identify the cause of dehydration. TREATMENT  Treatment of mild or moderate dehydration can often be done at home by increasing the amount of fluids that you drink. It is best to drink small amounts of fluid more often. Drinking too much at one time can make vomiting worse. Refer to the home care instructions below. Severe dehydration needs to be treated at the hospital where you will probably be given intravenous (IV) fluids that contain water and electrolytes. HOME CARE INSTRUCTIONS   Ask your caregiver about specific rehydration instructions.  Drink enough fluids to keep your urine clear or pale yellow.  Drink small amounts frequently if you have nausea and vomiting.  Eat as you normally do.  Avoid:  Foods or drinks high in sugar.  Carbonated  drinks.  Juice.  Extremely hot or cold fluids.  Drinks with caffeine.  Fatty, greasy foods.  Alcohol.  Tobacco.  Overeating.  Gelatin desserts.  Wash your hands well to avoid spreading bacteria and viruses.  Only take over-the-counter or prescription medicines for pain, discomfort, or fever as directed by your caregiver.  Ask your caregiver if you should continue all prescribed and over-the-counter medicines.  Keep all follow-up appointments with your caregiver. SEEK MEDICAL CARE IF:  You have abdominal pain and it increases or stays in one area (localizes).  You have a rash, stiff neck, or severe headache.  You are irritable, sleepy, or difficult to awaken.  You are weak, dizzy, or extremely thirsty. SEEK IMMEDIATE MEDICAL CARE IF:   You are unable to keep fluids down or you get worse despite treatment.  You have frequent episodes of vomiting or diarrhea.  You have blood or green matter (bile) in your vomit.  You have blood in your stool or your stool looks black and tarry.  You have not urinated in 6 to 8 hours, or you have only urinated a small amount of very dark urine.  You have a fever.  You faint. MAKE SURE YOU:   Understand these instructions.  Will watch your condition.  Will get help right away if you are not doing well or get worse. Document Released: 04/06/2005 Document Revised: 06/29/2011 Document Reviewed: 11/24/2010 Exit[employer] Patient Information 2013 [employer].  

## 2012-03-16 ENCOUNTER — Ambulatory Visit (HOSPITAL_BASED_OUTPATIENT_CLINIC_OR_DEPARTMENT_OTHER): Payer: Medicare Other | Admitting: Internal Medicine

## 2012-03-16 ENCOUNTER — Ambulatory Visit (HOSPITAL_BASED_OUTPATIENT_CLINIC_OR_DEPARTMENT_OTHER): Payer: Medicare Other | Admitting: Lab

## 2012-03-16 ENCOUNTER — Other Ambulatory Visit: Payer: Self-pay | Admitting: Medical Oncology

## 2012-03-16 ENCOUNTER — Ambulatory Visit (HOSPITAL_BASED_OUTPATIENT_CLINIC_OR_DEPARTMENT_OTHER): Payer: Medicare Other

## 2012-03-16 ENCOUNTER — Other Ambulatory Visit: Payer: Medicare Other | Admitting: Lab

## 2012-03-16 ENCOUNTER — Telehealth: Payer: Self-pay | Admitting: Medical Oncology

## 2012-03-16 VITALS — BP 111/69 | HR 77 | Temp 96.6°F | Resp 20 | Ht 61.0 in | Wt 146.7 lb

## 2012-03-16 DIAGNOSIS — C342 Malignant neoplasm of middle lobe, bronchus or lung: Secondary | ICD-10-CM

## 2012-03-16 DIAGNOSIS — C7970 Secondary malignant neoplasm of unspecified adrenal gland: Secondary | ICD-10-CM

## 2012-03-16 DIAGNOSIS — C3490 Malignant neoplasm of unspecified part of unspecified bronchus or lung: Secondary | ICD-10-CM

## 2012-03-16 DIAGNOSIS — R110 Nausea: Secondary | ICD-10-CM

## 2012-03-16 DIAGNOSIS — C3430 Malignant neoplasm of lower lobe, unspecified bronchus or lung: Secondary | ICD-10-CM

## 2012-03-16 LAB — CBC WITH DIFFERENTIAL/PLATELET
BASO%: 0.5 % (ref 0.0–2.0)
Basophils Absolute: 0 10*3/uL (ref 0.0–0.1)
HCT: 34.1 % — ABNORMAL LOW (ref 34.8–46.6)
HGB: 11.7 g/dL (ref 11.6–15.9)
MONO#: 0.2 10*3/uL (ref 0.1–0.9)
NEUT%: 67.7 % (ref 38.4–76.8)
WBC: 3.3 10*3/uL — ABNORMAL LOW (ref 3.9–10.3)
lymph#: 0.9 10*3/uL (ref 0.9–3.3)

## 2012-03-16 LAB — COMPREHENSIVE METABOLIC PANEL (CC13)
ALT: 29 U/L (ref 0–55)
Albumin: 3.6 g/dL (ref 3.5–5.0)
BUN: 46 mg/dL — ABNORMAL HIGH (ref 7.0–26.0)
CO2: 27 mEq/L (ref 22–29)
Calcium: 8.6 mg/dL (ref 8.4–10.4)
Chloride: 104 mEq/L (ref 98–107)
Creatinine: 1.5 mg/dL — ABNORMAL HIGH (ref 0.6–1.1)
Potassium: 3.9 mEq/L (ref 3.5–5.1)

## 2012-03-16 MED ORDER — ONDANSETRON 16 MG/50ML IVPB (CHCC)
16.0000 mg | Freq: Once | INTRAVENOUS | Status: AC
  Administered 2012-03-16: 16 mg via INTRAVENOUS

## 2012-03-16 MED ORDER — SODIUM CHLORIDE 0.9 % IV SOLN
INTRAVENOUS | Status: DC
  Administered 2012-03-16: 1000 mL via INTRAVENOUS

## 2012-03-16 MED ORDER — LORAZEPAM 0.5 MG PO TABS: ORAL_TABLET | ORAL | Status: DC

## 2012-03-16 NOTE — Patient Instructions (Addendum)
[facility] Discharge Instructions for Patients   Today you received the following: IV fluids and Zofran    BELOW ARE SYMPTOMS THAT SHOULD BE REPORTED IMMEDIATELY:  *FEVER GREATER THAN 100.5 F  *CHILLS WITH OR WITHOUT FEVER  NAUSEA AND VOMITING THAT IS NOT CONTROLLED WITH YOUR NAUSEA MEDICATION  *UNUSUAL SHORTNESS OF BREATH  *UNUSUAL BRUISING OR BLEEDING  TENDERNESS IN MOUTH AND THROAT WITH OR WITHOUT PRESENCE OF ULCERS  *URINARY PROBLEMS  *BOWEL PROBLEMS  UNUSUAL RASH Items with * indicate a potential emergency and should be followed up as soon as possible.  Feel free to call the clinic you have any questions or concerns. The clinic phone number is [phone].

## 2012-03-16 NOTE — Telephone Encounter (Signed)
Daughter notified of appt 

## 2012-03-16 NOTE — Telephone Encounter (Signed)
Called in ativan refill . 

## 2012-03-16 NOTE — Progress Notes (Signed)
[facility] [phone]   Fax:(336) [phone]  OFFICE PROGRESS NOTE  Teresa Dubonnet, MD [address] [place] [address]  DIAGNOSIS: Metastatic non-small cell lung cancer, adenocarcinoma with negative EGFR mutation and negative ALK gene translocation diagnosed in August of 2013   PRIOR THERAPY: None.   CURRENT THERAPY: Systemic chemotherapy with carboplatin for AUC of 5 and Alimta 500 mg/M2 every 3 weeks. The patient is status post 5 cycles.   INTERVAL HISTORY: [name] 78 y.o. female returns to the clinic today for woke in followup visit based on the patient's request. The patient has been complaining of nausea, vomiting and diarrhea recently. She received IV hydration yesterday at the cancer Center with mild improvement. She was started on Ativan which did help her nausea. She also had diarrhea for several times yesterday. She denied having any significant fever or chills. She has no abdominal pain or rectal bleeding. The patient has no sick family members. She is here today for evaluation and recommendation regarding her condition.  MEDICAL HISTORY: Past Medical History  Diagnosis Date  . Cervical spondylosis   . Renal cyst     simple bilateral 2008  . Diverticulosis   . Vertigo, benign positional   . Hyperlipidemia   . Aortic sclerosis     by exam,  . Erosive osteoarthritis of hand   . History of calcium pyrophosphate deposition disease (CPPD)   . Gout     vs pseudo gout flares in past  . Mediastinal adenopathy   . Tobacco use   . COPD, mild   . Glaucoma   . Hypertension   . Osteopenia   . Hiatal hernia   . Hemorrhoids   . H/O urinary incontinence   . Childhood asthma   . Bronchitis   . TIA (transient ischemic attack)   . Claustrophobia   . Dizziness   . Bleeding from the nose     hx of severe nose bleeds-last time was 2 yrs ago, slight bleed last week    ALLERGIES:  is allergic to shellfish allergy;  septra ds; statins; tape; and wellbutrin.  MEDICATIONS:  Current Outpatient Prescriptions  Medication Sig Dispense Refill  . ADVAIR DISKUS 250-50 MCG/DOSE AEPB       . ALPRAZolam (XANAX) 0.25 MG tablet       . aspirin 81 MG tablet Take 81 mg by mouth daily.      . B Complex-C (B-COMPLEX WITH VITAMIN C) tablet Take 1 tablet by mouth daily.      . Cholecalciferol (VITAMIN D) 2000 UNITS tablet Take 2,000 Units by mouth daily.      . colchicine 0.6 MG tablet Take 0.6 mg by mouth daily as needed. For gout flare      . dexamethasone (DECADRON) 4 MG tablet       . diclofenac sodium (VOLTAREN) 1 % GEL Apply 1 application topically 4 (four) times daily.       Marland Kitchen diltiazem 2 % GEL Apply 1 application topically 4 (four) times daily.       Marland Kitchen estrogens, conjugated, (PREMARIN) 0.625 MG tablet Take 0.625 mg by mouth daily. Take daily for 21 days then do not take for 7 days.      . fish oil-omega-3 fatty acids 1000 MG capsule Take 1 g by mouth 2 (two) times daily.       . folic acid (FOLVITE) 1 MG tablet Take 1 tablet (1 mg total) by mouth  daily.  30 tablet  4  . LORazepam (ATIVAN) 0.5 MG tablet Take 1 tablet by mouth or sublingually every 8 hours as needed for nausea  30 tablet  0  . methocarbamol (ROBAXIN) 500 MG tablet       . Multiple Vitamin (MULTIVITAMIN WITH MINERALS) TABS Take 1 tablet by mouth daily.      . nebivolol (BYSTOLIC) 5 MG tablet Take 5 mg by mouth daily with supper.       . ondansetron (ZOFRAN-ODT) 8 MG disintegrating tablet TAKE 1 TABLET UNDER TONGUE EVERY 6 TO 8 HOURS AS NEEDED FOR NAUSEA/VOMITING  30 tablet  1  . timolol (TIMOPTIC-XR) 0.5 % ophthalmic gel-forming Place 1 drop into the left eye daily.       Marland Kitchen OVER THE COUNTER MEDICATION Take 375 mg by mouth 2 (two) times daily. "Mega Beta Sitosterol 375 mg"      . OVER THE COUNTER MEDICATION Take 3 capsules by mouth daily as needed. "Tart Cherry Ultra"; for gout flare      . predniSONE (DELTASONE) 5 MG tablet       . prochlorperazine  (COMPAZINE) 10 MG tablet Take 1 tablet (10 mg total) by mouth every 6 (six) hours as needed.  60 tablet  0    SURGICAL HISTORY:  Past Surgical History  Procedure Date  . Cystoscopy, transurethral collagen injection therapy   . Left total knee arthroplasty   . Appendectomy   . Hemorrhoid surgery   . Cataract surgery x 2   . Eye surgery   . Tonsillectomy   . Abdominal hysterectomy   . Colonoscopy     REVIEW OF SYSTEMS:  A comprehensive review of systems was negative except for: Constitutional: positive for anorexia, fatigue and weight loss Gastrointestinal: positive for diarrhea, nausea and vomiting   PHYSICAL EXAMINATION: General appearance: alert, cooperative, fatigued and no distress Head: Normocephalic, without obvious abnormality, atraumatic Neck: no adenopathy Resp: clear to auscultation bilaterally Cardio: regular rate and rhythm, S1, S2 normal, no murmur, click, rub or gallop GI: soft, non-tender; bowel sounds normal; no masses,  no organomegaly Extremities: extremities normal, atraumatic, no cyanosis or edema  ECOG PERFORMANCE STATUS: 1 - Symptomatic but completely ambulatory  Blood pressure 111/69, pulse 77, temperature 96.6 F (35.9 C), temperature source Oral, resp. rate 20, height 5\' 1"  (1.549 m), weight 146 lb 11.2 oz (66.543 kg).  LABORATORY DATA: Lab Results  Component Value Date   WBC 3.3* 03/16/2012   HGB 11.7 03/16/2012   HCT 34.1* 03/16/2012   MCV 96.7 03/16/2012   PLT 142* 03/16/2012      Chemistry      Component Value Date/Time   NA 137 03/14/2012 1617   NA 140 03/09/2012 0956   K 3.8 03/14/2012 1617   K 4.4 03/09/2012 0956   CL 98 03/14/2012 1617   CL 107 03/09/2012 0956   CO2 24 03/14/2012 1617   CO2 24 03/09/2012 0956   BUN 38* 03/14/2012 1617   BUN 16.0 03/09/2012 0956   CREATININE 1.75* 03/14/2012 1617   CREATININE 1.0 03/09/2012 0956      Component Value Date/Time   CALCIUM 9.4 03/14/2012 1617   CALCIUM 9.0 03/09/2012 0956    ALKPHOS 99 03/09/2012 0956   ALKPHOS 82 11/26/2011 1041   AST 33 03/09/2012 0956   AST 25 11/26/2011 1041   ALT 14 03/09/2012 0956   ALT 15 11/26/2011 1041   BILITOT 0.59 03/09/2012 0956   BILITOT 0.3 11/26/2011 1041  RADIOGRAPHIC STUDIES: No results found.  ASSESSMENT: This is a very pleasant 78 years old white female with metastatic non-small cell lung cancer, adenocarcinoma currently undergoing systemic chemotherapy with carboplatin and Alimta status post 5 cycles. The last cycle was complicated with nausea vomiting as well as diarrhea. This could be the result of her recent systemic chemotherapy but a viral gastroenteritis cannot be excluded at this point.  PLAN: I will arrange for the patient to have IV fluids with 1 L of normal saline in addition to 16 mg of Zofran IV today. I advised her to keep taken Ativan on as-needed basis for anxiety or nausea. The patient has Zofran at home and she will use it on as-needed basis. I may consider her for treatment with Sancuso patch before the next cycle of her systemic chemotherapy for treatment of the chemotherapy induced nausea/vomiting. The patient would come back for followup visit as previously scheduled.  she was advised to call immediately if no improvement in her condition.  All questions were answered. The patient knows to call the clinic with any problems, questions or concerns. We can certainly see the patient much sooner if necessary.

## 2012-03-18 MED ORDER — GRANISETRON 3.1 MG/24HR TD PTCH: MEDICATED_PATCH | TRANSDERMAL | Status: DC

## 2012-03-18 NOTE — Patient Instructions (Signed)
You have persistent nausea and vomiting. I will arrange for IV hydration and antiemetic today

## 2012-03-23 ENCOUNTER — Other Ambulatory Visit (HOSPITAL_BASED_OUTPATIENT_CLINIC_OR_DEPARTMENT_OTHER): Payer: Medicare Other | Admitting: Lab

## 2012-03-23 DIAGNOSIS — C3490 Malignant neoplasm of unspecified part of unspecified bronchus or lung: Secondary | ICD-10-CM

## 2012-03-23 DIAGNOSIS — C3430 Malignant neoplasm of lower lobe, unspecified bronchus or lung: Secondary | ICD-10-CM

## 2012-03-23 LAB — CBC WITH DIFFERENTIAL/PLATELET
BASO%: 0.2 % (ref 0.0–2.0)
HCT: 24.7 % — ABNORMAL LOW (ref 34.8–46.6)
MCHC: 34.8 g/dL (ref 31.5–36.0)
MONO#: 0.6 10*3/uL (ref 0.1–0.9)
NEUT%: 51.1 % (ref 38.4–76.8)
RDW: 18.4 % — ABNORMAL HIGH (ref 11.2–14.5)
WBC: 2.2 10*3/uL — ABNORMAL LOW (ref 3.9–10.3)
lymph#: 0.5 10*3/uL — ABNORMAL LOW (ref 0.9–3.3)

## 2012-03-23 LAB — COMPREHENSIVE METABOLIC PANEL (CC13)
ALT: 17 U/L (ref 0–55)
AST: 25 U/L (ref 5–34)
Albumin: 3.3 g/dL — ABNORMAL LOW (ref 3.5–5.0)
CO2: 26 mEq/L (ref 22–29)
Calcium: 8.4 mg/dL (ref 8.4–10.4)
Chloride: 103 mEq/L (ref 98–107)
Potassium: 3.2 mEq/L — ABNORMAL LOW (ref 3.5–5.1)

## 2012-03-25 ENCOUNTER — Ambulatory Visit (HOSPITAL_COMMUNITY)
Admission: RE | Admit: 2012-03-25 | Discharge: 2012-03-25 | Disposition: A | Discharge status: Home / Self Care | Payer: Medicare Other | Source: Ambulatory Visit | Attending: Internal Medicine | Admitting: Internal Medicine

## 2012-03-25 ENCOUNTER — Other Ambulatory Visit: Payer: Self-pay | Admitting: Physician Assistant

## 2012-03-25 ENCOUNTER — Telehealth: Payer: Self-pay | Admitting: *Deleted

## 2012-03-25 ENCOUNTER — Other Ambulatory Visit (HOSPITAL_COMMUNITY): Payer: Self-pay | Admitting: Vascular Surgery

## 2012-03-25 DIAGNOSIS — R600 Localized edema: Secondary | ICD-10-CM

## 2012-03-25 DIAGNOSIS — R609 Edema, unspecified: Secondary | ICD-10-CM | POA: Insufficient documentation

## 2012-03-25 NOTE — Telephone Encounter (Signed)
Pt called c/o swelling in her lower extremities.  No pain, no redness.  They have been swollen x 3 days. She states "they feel firm".  Did not notice that the swelling decreased at night when laying down.  She is having a flare up of gout in her right big toe and is currently taking her colchicine as prescribed.  Per Tiana Loft, pt needs LE dopplers to rule out DVT.  Scheduled at [facility].  Pt aware of appt.  On call MD also notified.  SLJ

## 2012-03-25 NOTE — Progress Notes (Signed)
VASCULAR LAB PRELIMINARY  PRELIMINARY  PRELIMINARY  PRELIMINARY  Bilateral lower extremity venous duplex completed.    Preliminary report:  Negative for deep and superficial vein thrombosis bilaterally.  Report called to Dr. Darrold Span.  Belanna Manring, RVT 03/25/2012, 5:57 PM

## 2012-03-29 ENCOUNTER — Encounter: Payer: Self-pay | Admitting: Pharmacist

## 2012-03-30 ENCOUNTER — Telehealth: Payer: Self-pay | Admitting: Internal Medicine

## 2012-03-30 ENCOUNTER — Ambulatory Visit (HOSPITAL_BASED_OUTPATIENT_CLINIC_OR_DEPARTMENT_OTHER): Payer: Medicare Other | Admitting: Physician Assistant

## 2012-03-30 ENCOUNTER — Other Ambulatory Visit (HOSPITAL_BASED_OUTPATIENT_CLINIC_OR_DEPARTMENT_OTHER): Payer: Medicare Other | Admitting: Lab

## 2012-03-30 ENCOUNTER — Encounter: Payer: Self-pay | Admitting: Physician Assistant

## 2012-03-30 ENCOUNTER — Ambulatory Visit (HOSPITAL_BASED_OUTPATIENT_CLINIC_OR_DEPARTMENT_OTHER): Payer: Medicare Other

## 2012-03-30 VITALS — BP 160/75 | HR 68 | Temp 97.1°F | Resp 18 | Ht 61.0 in | Wt 155.7 lb

## 2012-03-30 DIAGNOSIS — C7970 Secondary malignant neoplasm of unspecified adrenal gland: Secondary | ICD-10-CM

## 2012-03-30 DIAGNOSIS — C342 Malignant neoplasm of middle lobe, bronchus or lung: Secondary | ICD-10-CM

## 2012-03-30 DIAGNOSIS — I10 Essential (primary) hypertension: Secondary | ICD-10-CM

## 2012-03-30 DIAGNOSIS — C3490 Malignant neoplasm of unspecified part of unspecified bronchus or lung: Secondary | ICD-10-CM

## 2012-03-30 DIAGNOSIS — M899 Disorder of bone, unspecified: Secondary | ICD-10-CM

## 2012-03-30 DIAGNOSIS — Z5111 Encounter for antineoplastic chemotherapy: Secondary | ICD-10-CM

## 2012-03-30 LAB — COMPREHENSIVE METABOLIC PANEL (CC13)
ALT: 27 U/L (ref 0–55)
AST: 43 U/L — ABNORMAL HIGH (ref 5–34)
Albumin: 3.5 g/dL (ref 3.5–5.0)
Alkaline Phosphatase: 118 U/L (ref 40–150)
BUN: 13 mg/dL (ref 7.0–26.0)
Potassium: 4.1 mEq/L (ref 3.5–5.1)
Sodium: 146 mEq/L — ABNORMAL HIGH (ref 136–145)

## 2012-03-30 LAB — CBC WITH DIFFERENTIAL/PLATELET
BASO%: 0.1 % (ref 0.0–2.0)
EOS%: 0.1 % (ref 0.0–7.0)
MCH: 32.4 pg (ref 25.1–34.0)
MCHC: 32 g/dL (ref 31.5–36.0)
NEUT%: 83.7 % — ABNORMAL HIGH (ref 38.4–76.8)
RBC: 2.56 10*6/uL — ABNORMAL LOW (ref 3.70–5.45)
RDW: 19.4 % — ABNORMAL HIGH (ref 11.2–14.5)
WBC: 9.4 10*3/uL (ref 3.9–10.3)
lymph#: 0.8 10*3/uL — ABNORMAL LOW (ref 0.9–3.3)
nRBC: 0 % (ref 0–0)

## 2012-03-30 MED ORDER — SODIUM CHLORIDE 0.9 % IV SOLN
340.0000 mg | Freq: Once | INTRAVENOUS | Status: AC
  Administered 2012-03-30: 340 mg via INTRAVENOUS
  Filled 2012-03-30: qty 34

## 2012-03-30 MED ORDER — SODIUM CHLORIDE 0.9 % IV SOLN
Freq: Once | INTRAVENOUS | Status: AC
  Administered 2012-03-30: 14:00:00 via INTRAVENOUS

## 2012-03-30 MED ORDER — SODIUM CHLORIDE 0.9 % IV SOLN
500.0000 mg/m2 | Freq: Once | INTRAVENOUS | Status: AC
  Administered 2012-03-30: 900 mg via INTRAVENOUS
  Filled 2012-03-30: qty 36

## 2012-03-30 MED ORDER — DEXAMETHASONE SODIUM PHOSPHATE 4 MG/ML IJ SOLN
20.0000 mg | Freq: Once | INTRAMUSCULAR | Status: AC
  Administered 2012-03-30: 20 mg via INTRAVENOUS

## 2012-03-30 MED ORDER — CYANOCOBALAMIN 1000 MCG/ML IJ SOLN
1000.0000 ug | Freq: Once | INTRAMUSCULAR | Status: AC
  Administered 2012-03-30: 1000 ug via INTRAMUSCULAR

## 2012-03-30 MED ORDER — ONDANSETRON 16 MG/50ML IVPB (CHCC): 16.0000 mg | Freq: Once | INTRAVENOUS | Status: DC

## 2012-03-30 NOTE — Patient Instructions (Signed)
[facility] Discharge Instructions for Patients Receiving Chemotherapy  Today you received the following chemotherapy agents: alimta, carboplatin, Vit B  To help prevent nausea and vomiting after your treatment, we encourage you to take your nausea medication. Take it as often as prescribed.     If you develop nausea and vomiting that is not controlled by your nausea medication, call the clinic. If it is after clinic hours your family physician or the after hours number for the clinic or go to the Emergency Department.   BELOW ARE SYMPTOMS THAT SHOULD BE REPORTED IMMEDIATELY:  *FEVER GREATER THAN 100.5 F  *CHILLS WITH OR WITHOUT FEVER  NAUSEA AND VOMITING THAT IS NOT CONTROLLED WITH YOUR NAUSEA MEDICATION  *UNUSUAL SHORTNESS OF BREATH  *UNUSUAL BRUISING OR BLEEDING  TENDERNESS IN MOUTH AND THROAT WITH OR WITHOUT PRESENCE OF ULCERS  *URINARY PROBLEMS  *BOWEL PROBLEMS  UNUSUAL RASH Items with * indicate a potential emergency and should be followed up as soon as possible.  Feel free to call the clinic you have any questions or concerns. The clinic phone number is [phone].   I have been informed and understand all the instructions given to me. I know to contact the clinic, my physician, or go to the Emergency Department if any problems should occur. I do not have any questions at this time, but understand that I may call the clinic during office hours   should I have any questions or need assistance in obtaining follow up care.    __________________________________________  _____________  __________ Signature of Patient or Authorized Representative            Date                   Time    __________________________________________ Nurse's Signature

## 2012-03-30 NOTE — Patient Instructions (Addendum)
Continue weekly labs Follow up with Dr. Arbutus Ped in 3 weeks with restaging CT scans of your chest, abdomen and pelvis to re-evaluate your disease

## 2012-03-30 NOTE — Telephone Encounter (Signed)
gv and printed appt schedule for pt for Dec...gv pt Barium..the patient aware central scheduling will contact with d/t of ct.

## 2012-03-31 ENCOUNTER — Telehealth: Payer: Self-pay | Admitting: Medical Oncology

## 2012-03-31 NOTE — Telephone Encounter (Signed)
Daughter asking if pt should see PCP for blood pressure and should she use Ativan as preventative or when nauseated. Per [name] pt needs to see PCP for BP and to take ativan as needed for nausea and ondansetron prn.

## 2012-04-03 NOTE — Progress Notes (Signed)
[facility] [phone]   Fax:([phone]  OFFICE PROGRESS NOTE  [name], MD [address] [place] [address]  DIAGNOSIS: Metastatic non-small cell lung cancer, adenocarcinoma with negative EGFR mutation and negative ALK gene translocation diagnosed in August of 2013   PRIOR THERAPY: None.   CURRENT THERAPY: Systemic chemotherapy with carboplatin for AUC of 5 and Alimta 500 mg/M2 every 3 weeks. The patient is status post 5 cycles.   INTERVAL HISTORY: [name] 78 y.o. female returns to the clinic today accompanied by her daughter for scheduled visit. She presents to proceed with cycle #6 of her systemic chemotherapy with carboplatin and Alimta. She's had significant problems with nausea and vomiting and is currently utilizing the Sancuso patch. She also has Ativan and Zofran at home for nausea as well. She reports that she has difficulty with her hemorrhoids and that to currently protruding. She is using diltiazem gel on her hemorrhoids as she was instructed. Her blood pressure systolic pressures at home have been running 140s to 150s range she does not recall the diastolic numbers.  She denied having any significant fever or chills. She has no abdominal pain or rectal bleeding.   MEDICAL HISTORY: Past Medical History  Diagnosis Date  . Cervical spondylosis   . Renal cyst     simple bilateral 2008  . Diverticulosis   . Vertigo, benign positional   . Hyperlipidemia   . Aortic sclerosis     by exam,  . Erosive osteoarthritis of hand   . History of calcium pyrophosphate deposition disease (CPPD)   . Gout     vs pseudo gout flares in past  . Mediastinal adenopathy   . Tobacco use   . COPD, mild   . Glaucoma   . Hypertension   . Osteopenia   . Hiatal hernia   . Hemorrhoids   . H/O urinary incontinence   . Childhood asthma   . Bronchitis   . TIA (transient ischemic attack)   . Claustrophobia   . Dizziness    . Bleeding from the nose     hx of severe nose bleeds-last time was 2 yrs ago, slight bleed last week    ALLERGIES:  is allergic to shellfish allergy; septra ds; statins; tape; and wellbutrin.  MEDICATIONS:  Current Outpatient Prescriptions  Medication Sig Dispense Refill  . ADVAIR DISKUS 250-50 MCG/DOSE AEPB       . ALPRAZolam (XANAX) 0.25 MG tablet       . aspirin 81 MG tablet Take 81 mg by mouth daily.      . B Complex-C (B-COMPLEX WITH VITAMIN C) tablet Take 1 tablet by mouth daily.      . Cholecalciferol (VITAMIN D) 2000 UNITS tablet Take 2,000 Units by mouth daily.      . colchicine 0.6 MG tablet Take 0.6 mg by mouth daily as needed. For gout flare      . dexamethasone (DECADRON) 4 MG tablet       . diclofenac sodium (VOLTAREN) 1 % GEL Apply 1 application topically 4 (four) times daily.       Marland Kitchen diltiazem 2 % GEL Apply 1 application topically 4 (four) times daily.       Marland Kitchen estrogens, conjugated, (PREMARIN) 0.625 MG tablet Take 0.625 mg by mouth daily. Take daily for 21 days then do not take for 7 days.      . fish oil-omega-3 fatty acids 1000 MG capsule Take  1 g by mouth 2 (two) times daily.       . folic acid (FOLVITE) 1 MG tablet Take 1 tablet (1 mg total) by mouth daily.  30 tablet  4  . granisetron (SANCUSO) 3.1 MG/24HR Apply to skin starting 24 hours before chemotherapy. Remove after 7 days.  1 each  0  . LORazepam (ATIVAN) 0.5 MG tablet Take 1 tablet by mouth or sublingually every 8 hours as needed for nausea  30 tablet  0  . methocarbamol (ROBAXIN) 500 MG tablet       . Multiple Vitamin (MULTIVITAMIN WITH MINERALS) TABS Take 1 tablet by mouth daily.      . nebivolol (BYSTOLIC) 5 MG tablet Take 5 mg by mouth daily with supper.       . ondansetron (ZOFRAN-ODT) 8 MG disintegrating tablet TAKE 1 TABLET UNDER TONGUE EVERY 6 TO 8 HOURS AS NEEDED FOR NAUSEA/VOMITING  30 tablet  1  . OVER THE COUNTER MEDICATION Take 375 mg by mouth 2 (two) times daily. "Mega Beta Sitosterol 375 mg"       . OVER THE COUNTER MEDICATION Take 3 capsules by mouth daily as needed. "Tart Cherry Ultra"; for gout flare      . predniSONE (DELTASONE) 5 MG tablet       . prochlorperazine (COMPAZINE) 10 MG tablet Take 1 tablet (10 mg total) by mouth every 6 (six) hours as needed.  60 tablet  0  . timolol (TIMOPTIC-XR) 0.5 % ophthalmic gel-forming Place 1 drop into the left eye daily.         SURGICAL HISTORY:  Past Surgical History  Procedure Date  . Cystoscopy, transurethral collagen injection therapy   . Left total knee arthroplasty   . Appendectomy   . Hemorrhoid surgery   . Cataract surgery x 2   . Eye surgery   . Tonsillectomy   . Abdominal hysterectomy   . Colonoscopy     REVIEW OF SYSTEMS:  A comprehensive review of systems was negative except for: Constitutional: positive for anorexia, fatigue and weight loss Gastrointestinal: positive for diarrhea, nausea and vomiting   PHYSICAL EXAMINATION: General appearance: alert, cooperative, fatigued and no distress Head: Normocephalic, without obvious abnormality, atraumatic Neck: no adenopathy Resp: clear to auscultation bilaterally Cardio: regular rate and rhythm, S1, S2 normal, no murmur, click, rub or gallop GI: soft, non-tender; bowel sounds normal; no masses,  no organomegaly Extremities: extremities normal, atraumatic, no cyanosis or edema  ECOG PERFORMANCE STATUS: 1 - Symptomatic but completely ambulatory  Blood pressure 160/75, pulse 68, temperature 97.1 F (36.2 C), temperature source Oral, resp. rate 18, height 5\' 1"  (1.549 m), weight 155 lb 11.2 oz (70.625 kg).  LABORATORY DATA: Lab Results  Component Value Date   WBC 9.4 03/30/2012   HGB 8.3* 03/30/2012   HCT 25.9* 03/30/2012   MCV 101.2* 03/30/2012   PLT 289 03/30/2012      Chemistry      Component Value Date/Time   NA 146* 03/30/2012 1202   NA 137 03/14/2012 1617   K 4.1 03/30/2012 1202   K 3.8 03/14/2012 1617   CL 107 03/30/2012 1202   CL 98 03/14/2012 1617    CO2 27 03/30/2012 1202   CO2 24 03/14/2012 1617   BUN 13.0 03/30/2012 1202   BUN 38* 03/14/2012 1617   CREATININE 0.9 03/30/2012 1202   CREATININE 1.75* 03/14/2012 1617      Component Value Date/Time   CALCIUM 8.6 03/30/2012 1202   CALCIUM 9.4 03/14/2012 1617  ALKPHOS 118 03/30/2012 1202   ALKPHOS 82 11/26/2011 1041   AST 43* 03/30/2012 1202   AST 25 11/26/2011 1041   ALT 27 03/30/2012 1202   ALT 15 11/26/2011 1041   BILITOT 0.41 03/30/2012 1202   BILITOT 0.3 11/26/2011 1041       RADIOGRAPHIC STUDIES: No results found.  ASSESSMENT/PLAN: This is a very pleasant 78 years old white female with metastatic non-small cell lung cancer, adenocarcinoma currently undergoing systemic chemotherapy with carboplatin and Alimta status post 5 cycles. She said significant problems with nausea and vomiting. Patient was discussed with Dr. Arbutus Ped. She will proceed with cycle #6 of her systemic chemotherapy with carboplatin and Alimta. She'll follow with Dr. Arbutus Ped in 3 weeks with repeat CBC differential, C. met and CT of the chest abdomen and pelvis with contrast to reevaluate her disease. She'll continue with weekly labs consisting of CBC differential and C. met. She will continue her current antiemetics as prescribed.  Laural Benes, Ondre Salvetti E, PA-C   All questions were answered. The patient knows to call the clinic with any problems, questions or concerns. We can certainly see the patient much sooner if necessary.

## 2012-04-06 ENCOUNTER — Other Ambulatory Visit (HOSPITAL_BASED_OUTPATIENT_CLINIC_OR_DEPARTMENT_OTHER): Payer: Medicare Other | Admitting: Lab

## 2012-04-06 DIAGNOSIS — C7970 Secondary malignant neoplasm of unspecified adrenal gland: Secondary | ICD-10-CM

## 2012-04-06 DIAGNOSIS — C3490 Malignant neoplasm of unspecified part of unspecified bronchus or lung: Secondary | ICD-10-CM

## 2012-04-06 DIAGNOSIS — C342 Malignant neoplasm of middle lobe, bronchus or lung: Secondary | ICD-10-CM

## 2012-04-06 LAB — COMPREHENSIVE METABOLIC PANEL (CC13)
ALT: 29 U/L (ref 0–55)
AST: 41 U/L — ABNORMAL HIGH (ref 5–34)
CO2: 28 mEq/L (ref 22–29)
Chloride: 102 mEq/L (ref 98–107)
Creatinine: 1.4 mg/dL — ABNORMAL HIGH (ref 0.6–1.1)
Sodium: 147 mEq/L — ABNORMAL HIGH (ref 136–145)
Total Bilirubin: 0.83 mg/dL (ref 0.20–1.20)
Total Protein: 7.1 g/dL (ref 6.4–8.3)

## 2012-04-06 LAB — CBC WITH DIFFERENTIAL/PLATELET
BASO%: 1 % (ref 0.0–2.0)
EOS%: 0.2 % (ref 0.0–7.0)
Eosinophils Absolute: 0 10*3/uL (ref 0.0–0.5)
LYMPH%: 43.7 % (ref 14.0–49.7)
MCH: 34.7 pg — ABNORMAL HIGH (ref 25.1–34.0)
MCHC: 34.7 g/dL (ref 31.5–36.0)
MCV: 100 fL (ref 79.5–101.0)
MONO%: 7.2 % (ref 0.0–14.0)
NEUT#: 0.9 10*3/uL — ABNORMAL LOW (ref 1.5–6.5)
Platelets: 164 10*3/uL (ref 145–400)
RBC: 2.67 10*6/uL — ABNORMAL LOW (ref 3.70–5.45)
RDW: 20.2 % — ABNORMAL HIGH (ref 11.2–14.5)

## 2012-04-06 LAB — HOLD TUBE, BLOOD BANK

## 2012-04-11 ENCOUNTER — Telehealth: Payer: Self-pay | Admitting: *Deleted

## 2012-04-11 NOTE — Telephone Encounter (Signed)
Pts daughter called to say that patient is having difficulty swallowing since Thanksgiving....wanting to know if Dr Arbutus Ped wants to have throat checked along with CT scans on 12/27. Has appt with Dr Arbutus Ped on 04/19/12. Saw PCP who stated pt was having dysphagia, not reflux.

## 2012-04-11 NOTE — Telephone Encounter (Signed)
Spoke with Dr Arbutus Ped and Ms Lamar Benes. Pt will have CT as scheduled on 12/27 and F/U with Dr Arbutus Ped on 12/31. Ms Lamar Benes states that pt has told her about feeling like something was in her throat since Thanksgiving, but they forgot to tell Dr Arbutus Ped.Marland KitchenMarland KitchenDaughter is fine with this plan

## 2012-04-12 ENCOUNTER — Other Ambulatory Visit: Payer: Self-pay | Admitting: Physician Assistant

## 2012-04-12 ENCOUNTER — Other Ambulatory Visit: Payer: Self-pay | Admitting: Medical Oncology

## 2012-04-12 ENCOUNTER — Other Ambulatory Visit: Payer: Medicare Other | Admitting: Lab

## 2012-04-12 DIAGNOSIS — C3490 Malignant neoplasm of unspecified part of unspecified bronchus or lung: Secondary | ICD-10-CM

## 2012-04-15 ENCOUNTER — Other Ambulatory Visit (HOSPITAL_BASED_OUTPATIENT_CLINIC_OR_DEPARTMENT_OTHER): Payer: Medicare Other | Admitting: Lab

## 2012-04-15 ENCOUNTER — Ambulatory Visit (HOSPITAL_COMMUNITY)
Admission: RE | Admit: 2012-04-15 | Discharge: 2012-04-15 | Disposition: A | Discharge status: Home / Self Care | Payer: Medicare Other | Source: Ambulatory Visit | Attending: Physician Assistant | Admitting: Physician Assistant

## 2012-04-15 DIAGNOSIS — C342 Malignant neoplasm of middle lobe, bronchus or lung: Secondary | ICD-10-CM

## 2012-04-15 DIAGNOSIS — R1310 Dysphagia, unspecified: Secondary | ICD-10-CM | POA: Insufficient documentation

## 2012-04-15 DIAGNOSIS — C3490 Malignant neoplasm of unspecified part of unspecified bronchus or lung: Secondary | ICD-10-CM | POA: Insufficient documentation

## 2012-04-15 DIAGNOSIS — R059 Cough, unspecified: Secondary | ICD-10-CM | POA: Insufficient documentation

## 2012-04-15 DIAGNOSIS — R05 Cough: Secondary | ICD-10-CM | POA: Insufficient documentation

## 2012-04-15 DIAGNOSIS — K5900 Constipation, unspecified: Secondary | ICD-10-CM | POA: Insufficient documentation

## 2012-04-15 LAB — COMPREHENSIVE METABOLIC PANEL (CC13)
ALT: 20 U/L (ref 0–55)
AST: 41 U/L — ABNORMAL HIGH (ref 5–34)
Alkaline Phosphatase: 107 U/L (ref 40–150)
Creatinine: 1.1 mg/dL (ref 0.6–1.1)
Sodium: 147 mEq/L — ABNORMAL HIGH (ref 136–145)
Total Bilirubin: 0.43 mg/dL (ref 0.20–1.20)

## 2012-04-15 LAB — CBC WITH DIFFERENTIAL/PLATELET
BASO%: 0.2 % (ref 0.0–2.0)
EOS%: 1.9 % (ref 0.0–7.0)
HCT: 23.2 % — ABNORMAL LOW (ref 34.8–46.6)
LYMPH%: 34.4 % (ref 14.0–49.7)
MCH: 35.4 pg — ABNORMAL HIGH (ref 25.1–34.0)
MCHC: 34.1 g/dL (ref 31.5–36.0)
MONO%: 22.8 % — ABNORMAL HIGH (ref 0.0–14.0)
NEUT%: 40.7 % (ref 38.4–76.8)
Platelets: 116 10*3/uL — ABNORMAL LOW (ref 145–400)
RBC: 2.24 10*6/uL — ABNORMAL LOW (ref 3.70–5.45)

## 2012-04-19 ENCOUNTER — Telehealth: Payer: Self-pay | Admitting: Internal Medicine

## 2012-04-19 ENCOUNTER — Ambulatory Visit (HOSPITAL_BASED_OUTPATIENT_CLINIC_OR_DEPARTMENT_OTHER): Payer: Medicare Other | Admitting: Internal Medicine

## 2012-04-19 ENCOUNTER — Other Ambulatory Visit: Payer: Medicare Other | Admitting: Lab

## 2012-04-19 ENCOUNTER — Encounter: Payer: Self-pay | Admitting: Internal Medicine

## 2012-04-19 ENCOUNTER — Telehealth: Payer: Self-pay | Admitting: *Deleted

## 2012-04-19 VITALS — BP 152/67 | HR 85 | Temp 97.2°F | Resp 18 | Ht 61.0 in | Wt 149.0 lb

## 2012-04-19 DIAGNOSIS — C3490 Malignant neoplasm of unspecified part of unspecified bronchus or lung: Secondary | ICD-10-CM

## 2012-04-19 DIAGNOSIS — C342 Malignant neoplasm of middle lobe, bronchus or lung: Secondary | ICD-10-CM

## 2012-04-19 NOTE — Telephone Encounter (Signed)
Per staff message and POF I have scheduled appts.  JMW  

## 2012-04-19 NOTE — Telephone Encounter (Signed)
appts made and printed for pt aom °

## 2012-04-19 NOTE — Patient Instructions (Signed)
No evidence for disease progression on his recent scan. We discussed maintenance chemotherapy with Alimta. First cycle next week. Followup in one month

## 2012-04-19 NOTE — Progress Notes (Signed)
[facility] [phone]) [phone]   Fax:(336) [phone]  OFFICE PROGRESS NOTE  [name], MD [address] [place] [address]  DIAGNOSIS: Metastatic non-small cell lung cancer, adenocarcinoma with negative EGFR mutation and negative ALK gene translocation diagnosed in August of 2013   PRIOR THERAPY: Systemic chemotherapy with carboplatin for AUC of 5 and Alimta 500 mg/M2 every 3 weeks. The patient is status post 6 cycles.  CURRENT THERAPY: The patient will start next week the first cycle of maintenance chemotherapy with Alimta at 500 mg/M2 every 3 weeks.   INTERVAL HISTORY: [name] 78 y.o. female returns to the clinic today for followup visit accompanied by her daughter. The patient is feeling fine today with no specific complaints. She tolerated the last cycle of her systemic chemotherapy with carboplatin and Alimta fairly well. She denied having any significant fever or chills. She denied having any nausea or vomiting. The patient denied having any significant chest pain but continues to have shortness breath with exertion, no cough or hemoptysis. She had repeat CT scan of the chest, abdomen and pelvis performed recently and she is here for evaluation and discussion of her scan results.  MEDICAL HISTORY: Past Medical History  Diagnosis Date  . Cervical spondylosis   . Renal cyst     simple bilateral 2008  . Diverticulosis   . Vertigo, benign positional   . Hyperlipidemia   . Aortic sclerosis     by exam,  . Erosive osteoarthritis of hand   . History of calcium pyrophosphate deposition disease (CPPD)   . Gout     vs pseudo gout flares in past  . Mediastinal adenopathy   . Tobacco use   . COPD, mild   . Glaucoma   . Hypertension   . Osteopenia   . Hiatal hernia   . Hemorrhoids   . H/O urinary incontinence   . Childhood asthma   . Bronchitis   . TIA (transient ischemic attack)   . Claustrophobia   . Dizziness   .  Bleeding from the nose     hx of severe nose bleeds-last time was 2 yrs ago, slight bleed last week    ALLERGIES:  is allergic to shellfish allergy; septra ds; statins; tape; and wellbutrin.  MEDICATIONS:  Current Outpatient Prescriptions  Medication Sig Dispense Refill  . ADVAIR DISKUS 250-50 MCG/DOSE AEPB       . ALPRAZolam (XANAX) 0.25 MG tablet       . aspirin 81 MG tablet Take 81 mg by mouth daily.      . B Complex-C (B-COMPLEX WITH VITAMIN C) tablet Take 1 tablet by mouth daily.      . Cholecalciferol (VITAMIN D) 2000 UNITS tablet Take 2,000 Units by mouth daily.      . colchicine 0.6 MG tablet Take 0.6 mg by mouth daily as needed. For gout flare      . dexamethasone (DECADRON) 4 MG tablet       . diclofenac sodium (VOLTAREN) 1 % GEL Apply 1 application topically 4 (four) times daily.       Marland Kitchen diltiazem 2 % GEL Apply 1 application topically 4 (four) times daily.       Marland Kitchen estrogens, conjugated, (PREMARIN) 0.625 MG tablet Take 0.625 mg by mouth daily. Take daily for 21 days then do not take for 7 days.      . fish oil-omega-3 fatty acids 1000 MG capsule Take 1 g by mouth  2 (two) times daily.       . folic acid (FOLVITE) 1 MG tablet Take 1 tablet (1 mg total) by mouth daily.  30 tablet  4  . granisetron (SANCUSO) 3.1 MG/24HR Apply to skin starting 24 hours before chemotherapy. Remove after 7 days.  1 each  0  . LORazepam (ATIVAN) 0.5 MG tablet Take 1 tablet by mouth or sublingually every 8 hours as needed for nausea  30 tablet  0  . methocarbamol (ROBAXIN) 500 MG tablet       . Multiple Vitamin (MULTIVITAMIN WITH MINERALS) TABS Take 1 tablet by mouth daily.      . nebivolol (BYSTOLIC) 5 MG tablet Take 5 mg by mouth daily with supper.       . ondansetron (ZOFRAN-ODT) 8 MG disintegrating tablet TAKE 1 TABLET UNDER TONGUE EVERY 6 TO 8 HOURS AS NEEDED FOR NAUSEA/VOMITING  30 tablet  1  . OVER THE COUNTER MEDICATION Take 375 mg by mouth 2 (two) times daily. "Mega Beta Sitosterol 375 mg"        . OVER THE COUNTER MEDICATION Take 3 capsules by mouth daily as needed. "Tart Cherry Ultra"; for gout flare      . predniSONE (DELTASONE) 5 MG tablet       . prochlorperazine (COMPAZINE) 10 MG tablet Take 1 tablet (10 mg total) by mouth every 6 (six) hours as needed.  60 tablet  0  . timolol (TIMOPTIC-XR) 0.5 % ophthalmic gel-forming Place 1 drop into the left eye daily.         SURGICAL HISTORY:  Past Surgical History  Procedure Date  . Cystoscopy, transurethral collagen injection therapy   . Left total knee arthroplasty   . Appendectomy   . Hemorrhoid surgery   . Cataract surgery x 2   . Eye surgery   . Tonsillectomy   . Abdominal hysterectomy   . Colonoscopy     REVIEW OF SYSTEMS:  A comprehensive review of systems was negative except for: Constitutional: positive for fatigue Respiratory: positive for dyspnea on exertion   PHYSICAL EXAMINATION: General appearance: alert, cooperative and no distress Head: Normocephalic, without obvious abnormality, atraumatic Neck: no adenopathy Lymph nodes: Cervical, supraclavicular, and axillary nodes normal. Resp: clear to auscultation bilaterally Cardio: regular rate and rhythm, S1, S2 normal, no murmur, click, rub or gallop GI: soft, non-tender; bowel sounds normal; no masses,  no organomegaly Extremities: extremities normal, atraumatic, no cyanosis or edema Neurologic: Alert and oriented X 3, normal strength and tone. Normal symmetric reflexes. Normal coordination and gait  ECOG PERFORMANCE STATUS: 1 - Symptomatic but completely ambulatory  Blood pressure 152/67, pulse 85, temperature 97.2 F (36.2 C), temperature source Oral, resp. rate 18, height 5\' 1"  (1.549 m), weight 149 lb (67.586 kg).  LABORATORY DATA: Lab Results  Component Value Date   WBC 2.4* 04/15/2012   HGB 7.9* 04/15/2012   HCT 23.2* 04/15/2012   MCV 103.8* 04/15/2012   PLT 116* 04/15/2012      Chemistry      Component Value Date/Time   NA 147* 04/15/2012  0941   NA 137 03/14/2012 1617   K 3.8 04/15/2012 0941   K 3.8 03/14/2012 1617   CL 106 04/15/2012 0941   CL 98 03/14/2012 1617   CO2 26 04/15/2012 0941   CO2 24 03/14/2012 1617   BUN 14.0 04/15/2012 0941   BUN 38* 03/14/2012 1617   CREATININE 1.1 04/15/2012 0941   CREATININE 1.75* 03/14/2012 1617      Component  Value Date/Time   CALCIUM 9.0 04/15/2012 0941   CALCIUM 9.4 03/14/2012 1617   ALKPHOS 107 04/15/2012 0941   ALKPHOS 82 11/26/2011 1041   AST 41* 04/15/2012 0941   AST 25 11/26/2011 1041   ALT 20 04/15/2012 0941   ALT 15 11/26/2011 1041   BILITOT 0.43 04/15/2012 0941   BILITOT 0.3 11/26/2011 1041       RADIOGRAPHIC STUDIES: Ct Chest Wo Contrast  04/15/2012  *RADIOLOGY REPORT*  Clinical Data:  Lung cancer.  Chemotherapy in progress.  Cough and constipation.  Difficulty swallowing.  CT CHEST, ABDOMEN AND PELVIS WITHOUT CONTRAST  Technique:  Multidetector CT imaging of the chest, abdomen and pelvis was performed following the standard protocol without IV contrast.  Comparison:  02/12/2012. PET 11/26/2011.    CT CHEST  Findings:  Although the largest mediastinal lymph node, in the lower left paratracheal station, is stable at 12 mm, other mediastinal lymph nodes have decreased in size.  Index low right paratracheal lymph node measures 10 mm (previously 13 mm).  Hilar regions are difficult to definitively evaluate without IV contrast. No axillary adenopathy.  Heart is mildly enlarged.  No pericardial effusion.  Coronary artery calcification.  Probable scarring at the apex of the right upper lobe.  Mild centrilobular emphysema.  Small patchy areas of peribronchovascular ground-glass in the inferior right upper lobe (example image 23) are new and therefore likely infectious/inflammatory in etiology. Lateral segment right middle lobe mass measures 2.1 x 3.3 cm, grossly stable.  Perihilar right middle lobe nodule appears slightly larger, measuring 1.8 x 1.7 cm (previously 1.6 x 1.3 cm). No new  nodules.  Linear scarring in the left lower lobe.  No pleural fluid.  Airway is unremarkable.  IMPRESSION:  1.  Mediastinal adenopathy is stable to minimally smaller in size. 2.  Dominant right middle lobe mass is stable.  Adjacent perihilar right middle lobe nodule appears slightly larger.    CT ABDOMEN AND PELVIS  Findings:  3 mm low attenuation lesion in the dome of the liver is unchanged.  Liver, gallbladder and adrenal glands are otherwise unremarkable.  A 5.5 cm exophytic low attenuation lesion off the posterior margin of the right kidney measures slightly smaller, previously 6.2 cm, and is likely a cyst.  Faint calcification in the upper pole left kidney is unchanged from 11/26/2011.  Spleen, pancreas, stomach and small bowel are unremarkable.  Stool is seen in the majority of the colon which is otherwise unremarkable.  Low attenuation lesion in the left adnexa measures 1.8 cm, stable, and is likely associated with left ovary.  Atherosclerotic calcification of the arterial vasculature without abdominal aortic aneurysm.  No pathologically enlarged lymph nodes.  No worrisome lytic or sclerotic lesions.  IMPRESSION:  1.  No evidence of metastatic disease in the abdomen or pelvis. 2.  Constipation. 3.  Stable small low attenuation lesion in the left adnexa, likely associated with the left ovary.   Original Report Authenticated By: Leanna Battles, M.D.     ASSESSMENT: This is a very pleasant 78 years old white female with metastatic non-small cell lung cancer, adenocarcinoma with negative EGFR mutation and negative ALK gene translocation status post 6 cycles of systemic chemotherapy with carboplatin and Alimta with initial response then stable disease after cycle #6.  PLAN: I discussed the scan results with the patient and her daughter today. I gave her the option between observation versus maintenance chemotherapy with single agent Alimta. The patient is interested in proceeding with maintenance treatment.  She  would be treated with Alimta 500 mg/M2 every 3 weeks. I discussed with the patient adverse effect of this treatment including but not limited to alopecia, myelosuppression, nausea and vomiting, liver or renal dysfunction. She is expected to start the first cycle of this treatment next week. She was advised to call immediately if she has any concerning symptoms in the interval. She would come back for followup visit in 4 weeks with the start of cycle #2. All questions were answered. The patient knows to call the clinic with any problems, questions or concerns. We can certainly see the patient much sooner if necessary.  I spent 15 minutes counseling the patient face to face. The total time spent in the appointment was 25 minutes.

## 2012-04-26 ENCOUNTER — Ambulatory Visit (HOSPITAL_BASED_OUTPATIENT_CLINIC_OR_DEPARTMENT_OTHER): Payer: Medicare Other

## 2012-04-26 ENCOUNTER — Other Ambulatory Visit (HOSPITAL_BASED_OUTPATIENT_CLINIC_OR_DEPARTMENT_OTHER): Payer: Medicare Other | Admitting: Lab

## 2012-04-26 VITALS — BP 140/63 | HR 80 | Temp 97.6°F | Resp 20

## 2012-04-26 DIAGNOSIS — C7970 Secondary malignant neoplasm of unspecified adrenal gland: Secondary | ICD-10-CM

## 2012-04-26 DIAGNOSIS — C3490 Malignant neoplasm of unspecified part of unspecified bronchus or lung: Secondary | ICD-10-CM

## 2012-04-26 DIAGNOSIS — C342 Malignant neoplasm of middle lobe, bronchus or lung: Secondary | ICD-10-CM

## 2012-04-26 DIAGNOSIS — Z5111 Encounter for antineoplastic chemotherapy: Secondary | ICD-10-CM

## 2012-04-26 LAB — CBC WITH DIFFERENTIAL/PLATELET
BASO%: 0 % (ref 0.0–2.0)
LYMPH%: 7.4 % — ABNORMAL LOW (ref 14.0–49.7)
MCH: 33.7 pg (ref 25.1–34.0)
MCHC: 31.1 g/dL — ABNORMAL LOW (ref 31.5–36.0)
MCV: 108.5 fL — ABNORMAL HIGH (ref 79.5–101.0)
MONO%: 4.9 % (ref 0.0–14.0)
Platelets: 159 10*3/uL (ref 145–400)
RBC: 2.58 10*6/uL — ABNORMAL LOW (ref 3.70–5.45)
WBC: 9.1 10*3/uL (ref 3.9–10.3)
nRBC: 0 % (ref 0–0)

## 2012-04-26 LAB — COMPREHENSIVE METABOLIC PANEL (CC13)
ALT: 20 U/L (ref 0–55)
AST: 44 U/L — ABNORMAL HIGH (ref 5–34)
Creatinine: 1 mg/dL (ref 0.6–1.1)
Total Bilirubin: 0.43 mg/dL (ref 0.20–1.20)

## 2012-04-26 MED ORDER — SODIUM CHLORIDE 0.9 % IV SOLN
500.0000 mg/m2 | Freq: Once | INTRAVENOUS | Status: AC
  Administered 2012-04-26: 850 mg via INTRAVENOUS
  Filled 2012-04-26: qty 34

## 2012-04-26 MED ORDER — DEXAMETHASONE SODIUM PHOSPHATE 10 MG/ML IJ SOLN
10.0000 mg | Freq: Once | INTRAMUSCULAR | Status: AC
  Administered 2012-04-26: 10 mg via INTRAVENOUS

## 2012-04-26 MED ORDER — SODIUM CHLORIDE 0.9 % IV SOLN
Freq: Once | INTRAVENOUS | Status: AC
  Administered 2012-04-26: 13:00:00 via INTRAVENOUS

## 2012-04-26 MED ORDER — ONDANSETRON 8 MG/50ML IVPB (CHCC)
8.0000 mg | Freq: Once | INTRAVENOUS | Status: AC
  Administered 2012-04-26: 8 mg via INTRAVENOUS

## 2012-04-26 NOTE — Patient Instructions (Signed)
[facility] Discharge Instructions for Patients Receiving Chemotherapy  Today you received the following chemotherapy agents Alimta  To help prevent nausea and vomiting after your treatment, we encourage you to take your nausea medication.   If you develop nausea and vomiting that is not controlled by your nausea medication, call the clinic. If it is after clinic hours your family physician or the after hours number for the clinic or go to the Emergency Department.   BELOW ARE SYMPTOMS THAT SHOULD BE REPORTED IMMEDIATELY:  *FEVER GREATER THAN 100.5 F  *CHILLS WITH OR WITHOUT FEVER  NAUSEA AND VOMITING THAT IS NOT CONTROLLED WITH YOUR NAUSEA MEDICATION  *UNUSUAL SHORTNESS OF BREATH  *UNUSUAL BRUISING OR BLEEDING  TENDERNESS IN MOUTH AND THROAT WITH OR WITHOUT PRESENCE OF ULCERS  *URINARY PROBLEMS  *BOWEL PROBLEMS  UNUSUAL RASH Items with * indicate a potential emergency and should be followed up as soon as possible.  One of the nurses will contact you 24 hours after your treatment. Please let the nurse know about any problems that you may have experienced. Feel free to call the clinic you have any questions or concerns. The clinic phone number is [phone].   I have been informed and understand all the instructions given to me. I know to contact the clinic, my physician, or go to the Emergency Department if any problems should occur. I do not have any questions at this time, but understand that I may call the clinic during office hours   should I have any questions or need assistance in obtaining follow up care.    __________________________________________  _____________  __________ Signature of Patient or Authorized Representative            Date                   Time    __________________________________________ Nurse's Signature

## 2012-04-27 ENCOUNTER — Ambulatory Visit: Payer: Medicare Other

## 2012-04-27 ENCOUNTER — Other Ambulatory Visit: Payer: Medicare Other | Admitting: Lab

## 2012-05-03 ENCOUNTER — Telehealth: Payer: Self-pay | Admitting: *Deleted

## 2012-05-03 ENCOUNTER — Other Ambulatory Visit: Payer: Self-pay | Admitting: *Deleted

## 2012-05-03 DIAGNOSIS — C3490 Malignant neoplasm of unspecified part of unspecified bronchus or lung: Secondary | ICD-10-CM

## 2012-05-03 MED ORDER — FOLIC ACID 1 MG PO TABS: 1.0000 mg | ORAL_TABLET | Freq: Every day | ORAL | Status: DC

## 2012-05-03 MED ORDER — LORAZEPAM 0.5 MG PO TABS: ORAL_TABLET | ORAL | Status: DC

## 2012-05-03 NOTE — Telephone Encounter (Signed)
Pt called asking if it is okay to take amoxicillin for her dental teeth cleaning tomorrow.  Per dr Donnald Garre, okay for her to take amoxicillin. Left msg on vm.  SLJ

## 2012-05-04 ENCOUNTER — Other Ambulatory Visit: Payer: Medicare Other | Admitting: Lab

## 2012-05-17 ENCOUNTER — Ambulatory Visit (HOSPITAL_BASED_OUTPATIENT_CLINIC_OR_DEPARTMENT_OTHER): Payer: Medicare Other

## 2012-05-17 ENCOUNTER — Telehealth: Payer: Self-pay | Admitting: Internal Medicine

## 2012-05-17 ENCOUNTER — Other Ambulatory Visit: Payer: Self-pay | Admitting: *Deleted

## 2012-05-17 ENCOUNTER — Encounter: Payer: Self-pay | Admitting: Physician Assistant

## 2012-05-17 ENCOUNTER — Other Ambulatory Visit (HOSPITAL_BASED_OUTPATIENT_CLINIC_OR_DEPARTMENT_OTHER): Payer: Medicare Other | Admitting: Lab

## 2012-05-17 ENCOUNTER — Ambulatory Visit (HOSPITAL_BASED_OUTPATIENT_CLINIC_OR_DEPARTMENT_OTHER): Payer: Medicare Other | Admitting: Physician Assistant

## 2012-05-17 VITALS — BP 150/72 | HR 71 | Temp 97.1°F | Resp 20 | Ht 61.0 in | Wt 148.5 lb

## 2012-05-17 DIAGNOSIS — C342 Malignant neoplasm of middle lobe, bronchus or lung: Secondary | ICD-10-CM

## 2012-05-17 DIAGNOSIS — C3490 Malignant neoplasm of unspecified part of unspecified bronchus or lung: Secondary | ICD-10-CM

## 2012-05-17 DIAGNOSIS — Z5111 Encounter for antineoplastic chemotherapy: Secondary | ICD-10-CM

## 2012-05-17 LAB — COMPREHENSIVE METABOLIC PANEL (CC13)
ALT: 50 U/L (ref 0–55)
AST: 71 U/L — ABNORMAL HIGH (ref 5–34)
Albumin: 3.8 g/dL (ref 3.5–5.0)
CO2: 21 mEq/L — ABNORMAL LOW (ref 22–29)
Calcium: 9.7 mg/dL (ref 8.4–10.4)
Chloride: 107 mEq/L (ref 98–107)
Creatinine: 1.1 mg/dL (ref 0.6–1.1)
Potassium: 4.3 mEq/L (ref 3.5–5.1)
Total Protein: 7.8 g/dL (ref 6.4–8.3)

## 2012-05-17 LAB — CBC WITH DIFFERENTIAL/PLATELET
BASO%: 0.2 % (ref 0.0–2.0)
Eosinophils Absolute: 0 10*3/uL (ref 0.0–0.5)
HCT: 29.6 % — ABNORMAL LOW (ref 34.8–46.6)
LYMPH%: 8.7 % — ABNORMAL LOW (ref 14.0–49.7)
MCHC: 31.4 g/dL — ABNORMAL LOW (ref 31.5–36.0)
MONO#: 0.4 10*3/uL (ref 0.1–0.9)
NEUT#: 5.2 10*3/uL (ref 1.5–6.5)
Platelets: 190 10*3/uL (ref 145–400)
RBC: 2.66 10*6/uL — ABNORMAL LOW (ref 3.70–5.45)
WBC: 6.1 10*3/uL (ref 3.9–10.3)
lymph#: 0.5 10*3/uL — ABNORMAL LOW (ref 0.9–3.3)
nRBC: 0 % (ref 0–0)

## 2012-05-17 MED ORDER — SODIUM CHLORIDE 0.9 % IV SOLN
500.0000 mg/m2 | Freq: Once | INTRAVENOUS | Status: AC
  Administered 2012-05-17: 850 mg via INTRAVENOUS
  Filled 2012-05-17: qty 34

## 2012-05-17 MED ORDER — ONDANSETRON 8 MG/50ML IVPB (CHCC)
8.0000 mg | Freq: Once | INTRAVENOUS | Status: AC
  Administered 2012-05-17: 8 mg via INTRAVENOUS

## 2012-05-17 MED ORDER — LORAZEPAM 0.5 MG PO TABS: ORAL_TABLET | ORAL | Status: DC

## 2012-05-17 MED ORDER — DEXAMETHASONE SODIUM PHOSPHATE 10 MG/ML IJ SOLN
10.0000 mg | Freq: Once | INTRAMUSCULAR | Status: AC
  Administered 2012-05-17: 10 mg via INTRAVENOUS

## 2012-05-17 MED ORDER — SODIUM CHLORIDE 0.9 % IV SOLN
Freq: Once | INTRAVENOUS | Status: AC
  Administered 2012-05-17: 11:00:00 via INTRAVENOUS

## 2012-05-17 NOTE — Patient Instructions (Addendum)
Follow up in 3 weeks prior to your next cycle of maintenance Alimta

## 2012-05-17 NOTE — Patient Instructions (Addendum)
[facility] Discharge Instructions for Patients Receiving Chemotherapy  Today you received the following chemotherapy agents Alimta.  To help prevent nausea and vomiting after your treatment, we encourage you to take your nausea medication as prescribed.   If you develop nausea and vomiting that is not controlled by your nausea medication, call the clinic. If it is after clinic hours your family physician or the after hours number for the clinic or go to the Emergency Department.   BELOW ARE SYMPTOMS THAT SHOULD BE REPORTED IMMEDIATELY:  *FEVER GREATER THAN 100.5 F  *CHILLS WITH OR WITHOUT FEVER  NAUSEA AND VOMITING THAT IS NOT CONTROLLED WITH YOUR NAUSEA MEDICATION  *UNUSUAL SHORTNESS OF BREATH  *UNUSUAL BRUISING OR BLEEDING  TENDERNESS IN MOUTH AND THROAT WITH OR WITHOUT PRESENCE OF ULCERS  *URINARY PROBLEMS  *BOWEL PROBLEMS  UNUSUAL RASH Items with * indicate a potential emergency and should be followed up as soon as possible.  Feel free to call the clinic you have any questions or concerns. The clinic phone number is [phone].   I have been informed and understand all the instructions given to me. I know to contact the clinic, my physician, or go to the Emergency Department if any problems should occur. I do not have any questions at this time, but understand that I may call the clinic during office hours   should I have any questions or need assistance in obtaining follow up care.    __________________________________________  _____________  __________ Signature of Patient or Authorized Representative            Date                   Time    __________________________________________ Nurse's Signature    

## 2012-05-17 NOTE — Telephone Encounter (Signed)
Per staff message and POF I have scheduled appts.  JMW  

## 2012-05-17 NOTE — Progress Notes (Signed)
[facility] [phone]   [phone]  OFFICE PROGRESS NOTE  [name], MD [address] [place] [address]  DIAGNOSIS: Metastatic non-small cell lung cancer, adenocarcinoma with negative EGFR mutation and negative ALK gene translocation diagnosed in August of 2013   PRIOR THERAPY: Systemic chemotherapy with carboplatin for AUC of 5 and Alimta 500 mg/M2 every 3 weeks. The patient is status post 6 cycles.  CURRENT THERAPY:  Maintenance chemotherapy with Alimta at 500 mg/M2 every 3 weeks. Status post 1 cycle   INTERVAL HISTORY: [name] 79 y.o. female returns to the clinic today for followup visit accompanied by her daughter, [name]. She tolerated her first cycle of maintenance chemotherapy with single agent Alimta relatively well with the exception of feeling like she had the flu for about 2 days after chemotherapy and some occasional nausea. She request a refill for her lorazepam and her folic acid. Folic acid refills of origin handled. She reports that she needs a Put on one of her teeth this is scheduled for 06/08/2012. She voiced no specific complaints today.  She denied having any significant fever or chills. She denied having any vomiting. The patient denied having any significant chest pain but continues to have shortness breath with exertion, no cough or hemoptysis.   MEDICAL HISTORY: Past Medical History  Diagnosis Date  . Cervical spondylosis   . Renal cyst     simple bilateral 2008  . Diverticulosis   . Vertigo, benign positional   . Hyperlipidemia   . Aortic sclerosis     by exam,  . Erosive osteoarthritis of hand   . History of calcium pyrophosphate deposition disease (CPPD)   . Gout     vs pseudo gout flares in past  . Mediastinal adenopathy   . Tobacco use   . COPD, mild   . Glaucoma   . Hypertension   . Osteopenia   . Hiatal hernia   . Hemorrhoids   . H/O urinary incontinence   . Childhood  asthma   . Bronchitis   . TIA (transient ischemic attack)   . Claustrophobia   . Dizziness   . Bleeding from the nose     hx of severe nose bleeds-last time was 2 yrs ago, slight bleed last week    ALLERGIES:  is allergic to shellfish allergy; septra ds; statins; tape; and wellbutrin.  MEDICATIONS:  Current Outpatient Prescriptions  Medication Sig Dispense Refill  . ADVAIR DISKUS 250-50 MCG/DOSE AEPB       . ALPRAZolam (XANAX) 0.25 MG tablet       . aspirin 81 MG tablet Take 81 mg by mouth daily.      . B Complex-C (B-COMPLEX WITH VITAMIN C) tablet Take 1 tablet by mouth daily.      . Cholecalciferol (VITAMIN D) 2000 UNITS tablet Take 2,000 Units by mouth daily.      . colchicine 0.6 MG tablet Take 0.6 mg by mouth daily as needed. For gout flare      . dexamethasone (DECADRON) 4 MG tablet       . diclofenac sodium (VOLTAREN) 1 % GEL Apply 1 application topically 4 (four) times daily.       Marland Kitchen diltiazem 2 % GEL Apply 1 application topically 4 (four) times daily.       Marland Kitchen estrogens, conjugated, (PREMARIN) 0.625 MG tablet Take 0.625 mg by mouth daily. Patient takes daily      . fish  oil-omega-3 fatty acids 1000 MG capsule Take 1 g by mouth 2 (two) times daily.       . folic acid (FOLVITE) 1 MG tablet Take 1 tablet (1 mg total) by mouth daily.  30 tablet  4  . granisetron (SANCUSO) 3.1 MG/24HR Apply to skin starting 24 hours before chemotherapy. Remove after 7 days.  1 each  0  . LORazepam (ATIVAN) 0.5 MG tablet Take 1 tablet by mouth or sublingually every 8 hours as needed for nausea  30 tablet  0  . methocarbamol (ROBAXIN) 500 MG tablet       . Multiple Vitamin (MULTIVITAMIN WITH MINERALS) TABS Take 1 tablet by mouth daily.      . nebivolol (BYSTOLIC) 5 MG tablet Take 5 mg by mouth daily with supper.       . ondansetron (ZOFRAN-ODT) 8 MG disintegrating tablet TAKE 1 TABLET UNDER TONGUE EVERY 6 TO 8 HOURS AS NEEDED FOR NAUSEA/VOMITING  30 tablet  1  . OVER THE COUNTER MEDICATION Take 375  mg by mouth 2 (two) times daily. "Mega Beta Sitosterol 375 mg"      . OVER THE COUNTER MEDICATION Take 3 capsules by mouth daily as needed. "Tart Cherry Ultra"; for gout flare      . predniSONE (DELTASONE) 5 MG tablet       . prochlorperazine (COMPAZINE) 10 MG tablet Take 1 tablet (10 mg total) by mouth every 6 (six) hours as needed.  60 tablet  0  . timolol (TIMOPTIC-XR) 0.5 % ophthalmic gel-forming Place 1 drop into the left eye 2 (two) times daily.         SURGICAL HISTORY:  Past Surgical History  Procedure Date  . Cystoscopy, transurethral collagen injection therapy   . Left total knee arthroplasty   . Appendectomy   . Hemorrhoid surgery   . Cataract surgery x 2   . Eye surgery   . Tonsillectomy   . Abdominal hysterectomy   . Colonoscopy     REVIEW OF SYSTEMS:  A comprehensive review of systems was negative except for: Constitutional: positive for fatigue Respiratory: positive for dyspnea on exertion Gastrointestinal: positive for nausea   PHYSICAL EXAMINATION: General appearance: alert, cooperative and no distress Head: Normocephalic, without obvious abnormality, atraumatic Neck: no adenopathy Lymph nodes: Cervical, supraclavicular, and axillary nodes normal. Resp: clear to auscultation bilaterally Cardio: regular rate and rhythm, S1, S2 normal, no murmur, click, rub or gallop GI: soft, non-tender; bowel sounds normal; no masses,  no organomegaly Extremities: extremities normal, atraumatic, no cyanosis or edema Neurologic: Alert and oriented X 3, normal strength and tone. Normal symmetric reflexes. Normal coordination and gait  ECOG PERFORMANCE STATUS: 1 - Symptomatic but completely ambulatory  Blood pressure 150/72, pulse 71, temperature 97.1 F (36.2 C), temperature source Oral, resp. rate 20, height 5\' 1"  (1.549 m), weight 148 lb 8 oz (67.359 kg).  LABORATORY DATA: Lab Results  Component Value Date   WBC 6.1 05/17/2012   HGB 9.3* 05/17/2012   HCT 29.6* 05/17/2012    MCV 111.3* 05/17/2012   PLT 190 05/17/2012      Chemistry      Component Value Date/Time   NA 139 05/17/2012 0928   NA 137 03/14/2012 1617   K 4.3 05/17/2012 0928   K 3.8 03/14/2012 1617   CL 107 05/17/2012 0928   CL 98 03/14/2012 1617   CO2 21* 05/17/2012 0928   CO2 24 03/14/2012 1617   BUN 23.7 05/17/2012 0928   BUN 38* 03/14/2012  1617   CREATININE 1.1 05/17/2012 0928   CREATININE 1.75* 03/14/2012 1617      Component Value Date/Time   CALCIUM 9.7 05/17/2012 0928   CALCIUM 9.4 03/14/2012 1617   ALKPHOS 115 05/17/2012 0928   ALKPHOS 82 11/26/2011 1041   AST 71* 05/17/2012 0928   AST 25 11/26/2011 1041   ALT 50 05/17/2012 0928   ALT 15 11/26/2011 1041   BILITOT 0.41 05/17/2012 0928   BILITOT 0.3 11/26/2011 1041       RADIOGRAPHIC STUDIES: Ct Chest Wo Contrast  04/15/2012  *RADIOLOGY REPORT*  Clinical Data:  Lung cancer.  Chemotherapy in progress.  Cough and constipation.  Difficulty swallowing.  CT CHEST, ABDOMEN AND PELVIS WITHOUT CONTRAST  Technique:  Multidetector CT imaging of the chest, abdomen and pelvis was performed following the standard protocol without IV contrast.  Comparison:  02/12/2012. PET 11/26/2011.    CT CHEST  Findings:  Although the largest mediastinal lymph node, in the lower left paratracheal station, is stable at 12 mm, other mediastinal lymph nodes have decreased in size.  Index low right paratracheal lymph node measures 10 mm (previously 13 mm).  Hilar regions are difficult to definitively evaluate without IV contrast. No axillary adenopathy.  Heart is mildly enlarged.  No pericardial effusion.  Coronary artery calcification.  Probable scarring at the apex of the right upper lobe.  Mild centrilobular emphysema.  Small patchy areas of peribronchovascular ground-glass in the inferior right upper lobe (example image 23) are new and therefore likely infectious/inflammatory in etiology. Lateral segment right middle lobe mass measures 2.1 x 3.3 cm, grossly stable.  Perihilar  right middle lobe nodule appears slightly larger, measuring 1.8 x 1.7 cm (previously 1.6 x 1.3 cm). No new nodules.  Linear scarring in the left lower lobe.  No pleural fluid.  Airway is unremarkable.  IMPRESSION:  1.  Mediastinal adenopathy is stable to minimally smaller in size. 2.  Dominant right middle lobe mass is stable.  Adjacent perihilar right middle lobe nodule appears slightly larger.    CT ABDOMEN AND PELVIS  Findings:  3 mm low attenuation lesion in the dome of the liver is unchanged.  Liver, gallbladder and adrenal glands are otherwise unremarkable.  A 5.5 cm exophytic low attenuation lesion off the posterior margin of the right kidney measures slightly smaller, previously 6.2 cm, and is likely a cyst.  Faint calcification in the upper pole left kidney is unchanged from 11/26/2011.  Spleen, pancreas, stomach and small bowel are unremarkable.  Stool is seen in the majority of the colon which is otherwise unremarkable.  Low attenuation lesion in the left adnexa measures 1.8 cm, stable, and is likely associated with left ovary.  Atherosclerotic calcification of the arterial vasculature without abdominal aortic aneurysm.  No pathologically enlarged lymph nodes.  No worrisome lytic or sclerotic lesions.  IMPRESSION:  1.  No evidence of metastatic disease in the abdomen or pelvis. 2.  Constipation. 3.  Stable small low attenuation lesion in the left adnexa, likely associated with the left ovary.   Original Report Authenticated By: Leanna Battles, M.D.     ASSESSMENT/PLAN: This is a very pleasant 79 years old white female with metastatic non-small cell lung cancer, adenocarcinoma with negative EGFR mutation and negative ALK gene translocation status post 6 cycles of systemic chemotherapy with carboplatin and Alimta with initial response then stable disease after cycle #6. She's currently being treated with maintenance chemotherapy in the form of single agent Alimta at 500 mg per square given  every 3  weeks, status post 1 cycle. Patient was discussed with Dr. Arbutus Ped. She'll proceed with cycle #2 of her maintenance chemotherapy with single agent Alimta. She was given a prescription for her lorazepam. She will return in 3 weeks with repeat CBC differential and C. met prior to cycle #3 of her maintenance chemotherapy with single agent Alimta. She should have no problems having the cap done for her tooth as this is not invasive and her counts are stable.  Laural Benes, Shani Fitch E, PA-C   All questions were answered. The patient knows to call the clinic with any problems, questions or concerns. We can certainly see the patient much sooner if necessary.  I spent 20 minutes counseling the patient face to face. The total time spent in the appointment was 30 minutes.

## 2012-05-17 NOTE — Telephone Encounter (Signed)
gv and printed appt schedule for pt for Feb....emailed michelle to add tx...the patient awre

## 2012-05-30 ENCOUNTER — Other Ambulatory Visit: Payer: Self-pay | Admitting: Internal Medicine

## 2012-05-31 ENCOUNTER — Other Ambulatory Visit: Payer: Self-pay | Admitting: Internal Medicine

## 2012-06-01 ENCOUNTER — Other Ambulatory Visit: Payer: Self-pay | Admitting: Internal Medicine

## 2012-06-01 ENCOUNTER — Other Ambulatory Visit: Payer: Self-pay | Admitting: Medical Oncology

## 2012-06-01 NOTE — Telephone Encounter (Signed)
Phamracy does not have refill , Dr Arbutus Ped approved it so I called it in.Pt notified.

## 2012-06-06 ENCOUNTER — Encounter: Payer: Self-pay | Admitting: Pharmacist

## 2012-06-07 ENCOUNTER — Telehealth: Payer: Self-pay | Admitting: Internal Medicine

## 2012-06-07 ENCOUNTER — Other Ambulatory Visit (HOSPITAL_BASED_OUTPATIENT_CLINIC_OR_DEPARTMENT_OTHER): Payer: Medicare Other | Admitting: Lab

## 2012-06-07 ENCOUNTER — Ambulatory Visit (HOSPITAL_BASED_OUTPATIENT_CLINIC_OR_DEPARTMENT_OTHER): Payer: Medicare Other | Admitting: Physician Assistant

## 2012-06-07 ENCOUNTER — Ambulatory Visit (HOSPITAL_BASED_OUTPATIENT_CLINIC_OR_DEPARTMENT_OTHER): Payer: Medicare Other

## 2012-06-07 ENCOUNTER — Encounter: Payer: Self-pay | Admitting: Physician Assistant

## 2012-06-07 VITALS — BP 166/71 | HR 74 | Temp 98.3°F | Resp 18 | Ht 61.0 in | Wt 144.7 lb

## 2012-06-07 DIAGNOSIS — C342 Malignant neoplasm of middle lobe, bronchus or lung: Secondary | ICD-10-CM

## 2012-06-07 DIAGNOSIS — C3490 Malignant neoplasm of unspecified part of unspecified bronchus or lung: Secondary | ICD-10-CM

## 2012-06-07 DIAGNOSIS — C778 Secondary and unspecified malignant neoplasm of lymph nodes of multiple regions: Secondary | ICD-10-CM

## 2012-06-07 DIAGNOSIS — Z5111 Encounter for antineoplastic chemotherapy: Secondary | ICD-10-CM

## 2012-06-07 LAB — CBC WITH DIFFERENTIAL/PLATELET
Basophils Absolute: 0 10*3/uL (ref 0.0–0.1)
Eosinophils Absolute: 0 10*3/uL (ref 0.0–0.5)
HGB: 9.2 g/dL — ABNORMAL LOW (ref 11.6–15.9)
LYMPH%: 10.8 % — ABNORMAL LOW (ref 14.0–49.7)
MCV: 110.8 fL — ABNORMAL HIGH (ref 79.5–101.0)
MONO#: 0.7 10*3/uL (ref 0.1–0.9)
MONO%: 11 % (ref 0.0–14.0)
NEUT#: 5.1 10*3/uL (ref 1.5–6.5)
Platelets: 237 10*3/uL (ref 145–400)
RDW: 16.2 % — ABNORMAL HIGH (ref 11.2–14.5)
WBC: 6.6 10*3/uL (ref 3.9–10.3)

## 2012-06-07 LAB — COMPREHENSIVE METABOLIC PANEL (CC13)
Albumin: 3.7 g/dL (ref 3.5–5.0)
Alkaline Phosphatase: 158 U/L — ABNORMAL HIGH (ref 40–150)
BUN: 15 mg/dL (ref 7.0–26.0)
CO2: 24 mEq/L (ref 22–29)
Glucose: 121 mg/dl — ABNORMAL HIGH (ref 70–99)
Potassium: 4.5 mEq/L (ref 3.5–5.1)
Total Protein: 8.1 g/dL (ref 6.4–8.3)

## 2012-06-07 MED ORDER — SODIUM CHLORIDE 0.9 % IV SOLN
500.0000 mg/m2 | Freq: Once | INTRAVENOUS | Status: AC
  Administered 2012-06-07: 850 mg via INTRAVENOUS
  Filled 2012-06-07: qty 34

## 2012-06-07 MED ORDER — DEXAMETHASONE SODIUM PHOSPHATE 10 MG/ML IJ SOLN
10.0000 mg | Freq: Once | INTRAMUSCULAR | Status: AC
  Administered 2012-06-07: 10 mg via INTRAVENOUS

## 2012-06-07 MED ORDER — ONDANSETRON 8 MG/50ML IVPB (CHCC)
8.0000 mg | Freq: Once | INTRAVENOUS | Status: AC
  Administered 2012-06-07: 8 mg via INTRAVENOUS

## 2012-06-07 MED ORDER — CYANOCOBALAMIN 1000 MCG/ML IJ SOLN
1000.0000 ug | Freq: Once | INTRAMUSCULAR | Status: AC
  Administered 2012-06-07: 1000 ug via INTRAMUSCULAR

## 2012-06-07 MED ORDER — SODIUM CHLORIDE 0.9 % IV SOLN
Freq: Once | INTRAVENOUS | Status: AC
  Administered 2012-06-07: 14:00:00 via INTRAVENOUS

## 2012-06-07 NOTE — Patient Instructions (Addendum)
[facility] Discharge Instructions for Patients Receiving Chemotherapy  Today you received the following chemotherapy agents: Alimta.   To help prevent nausea and vomiting after your treatment, we encourage you to take your nausea medication, Ondansetron (Zofran). Begin taking it the morning of 06/08/12 and take it every 12 hours to prevent nausea.  Take Compazine (Prochlorperazine) every six hours as needed for nausea.   If you develop nausea and vomiting that is not controlled by your nausea medication, call the clinic. If it is after clinic hours your family physician or the after hours number for the clinic or go to the Emergency Department.   BELOW ARE SYMPTOMS THAT SHOULD BE REPORTED IMMEDIATELY:  *FEVER GREATER THAN 100.5 F  *CHILLS WITH OR WITHOUT FEVER  NAUSEA AND VOMITING THAT IS NOT CONTROLLED WITH YOUR NAUSEA MEDICATION  *UNUSUAL SHORTNESS OF BREATH  *UNUSUAL BRUISING OR BLEEDING  TENDERNESS IN MOUTH AND THROAT WITH OR WITHOUT PRESENCE OF ULCERS  *URINARY PROBLEMS  *BOWEL PROBLEMS  UNUSUAL RASH Items with * indicate a potential emergency and should be followed up as soon as possible.  Feel free to call the clinic you have any questions or concerns. The clinic phone number is [phone].   I have been informed and understand all the instructions given to me. I know to contact the clinic, my physician, or go to the Emergency Department if any problems should occur. I do not have any questions at this time, but understand that I may call the clinic during office hours   should I have any questions or need assistance in obtaining follow up care.

## 2012-06-07 NOTE — Patient Instructions (Addendum)
Followup with Dr. Arbutus Ped in 3 weeks with a restaging CT scan of your chest abdomen and pelvis to reevaluate your disease

## 2012-06-07 NOTE — Telephone Encounter (Signed)
gv and printed appt schedule for pt for March...gv pt barium.Marland Kitchen

## 2012-06-08 ENCOUNTER — Other Ambulatory Visit: Payer: Self-pay | Admitting: Certified Registered Nurse Anesthetist

## 2012-06-09 ENCOUNTER — Encounter: Payer: Self-pay | Admitting: Internal Medicine

## 2012-06-09 NOTE — Progress Notes (Signed)
[facility] [phone]   [phone]  OFFICE PROGRESS NOTE  [name], MD [address] [place] [address]  DIAGNOSIS: Metastatic non-small cell lung cancer, adenocarcinoma with negative EGFR mutation and negative ALK gene translocation diagnosed in August of 2013   PRIOR THERAPY: Systemic chemotherapy with carboplatin for AUC of 5 and Alimta 500 mg/M2 every 3 weeks. The patient is status post 6 cycles.  CURRENT THERAPY:  Maintenance chemotherapy with Alimta at 500 mg/M2 every 3 weeks. Status post 2 cycles   INTERVAL HISTORY: [name] 79 y.o. female returns to the clinic today for followup visit accompanied by her daughter, [name]. She reports she had nausea up until 4 days ago. She also had some diarrhea that she treated with Imodium. She had some difficulty urinating with some burning but the symptoms have resolved. She denied any fever or chills. She has an area of redness on her right mid shin area. Of note patient states that her 60 pound all sleeps in bed with her.  She voiced no other specific complaints today.  She denied having any significant fever or chills. She denied having any vomiting. The patient denied having any significant chest pain but continues to have shortness breath with exertion, no cough or hemoptysis.   MEDICAL HISTORY: Past Medical History  Diagnosis Date  . Cervical spondylosis   . Renal cyst     simple bilateral 2008  . Diverticulosis   . Vertigo, benign positional   . Hyperlipidemia   . Aortic sclerosis     by exam,  . Erosive osteoarthritis of hand   . History of calcium pyrophosphate deposition disease (CPPD)   . Gout     vs pseudo gout flares in past  . Mediastinal adenopathy   . Tobacco use   . COPD, mild   . Glaucoma   . Hypertension   . Osteopenia   . Hiatal hernia   . Hemorrhoids   . H/O urinary incontinence   . Childhood asthma   . Bronchitis   . TIA (transient  ischemic attack)   . Claustrophobia   . Dizziness   . Bleeding from the nose     hx of severe nose bleeds-last time was 2 yrs ago, slight bleed last week    ALLERGIES:  is allergic to shellfish allergy; septra ds; statins; tape; and wellbutrin.  MEDICATIONS:  Current Outpatient Prescriptions  Medication Sig Dispense Refill  . ADVAIR DISKUS 250-50 MCG/DOSE AEPB       . ALPRAZolam (XANAX) 0.25 MG tablet       . aspirin 81 MG tablet Take 81 mg by mouth daily.      . B Complex-C (B-COMPLEX WITH VITAMIN C) tablet Take 1 tablet by mouth daily.      . Cholecalciferol (VITAMIN D) 2000 UNITS tablet Take 2,000 Units by mouth daily.      . colchicine 0.6 MG tablet Take 0.6 mg by mouth daily as needed. For gout flare      . dexamethasone (DECADRON) 4 MG tablet TAKE 1 TAB 2X A DAY W/ A MEAL DAY BEFORE CHEMO/DAY OF/AND DAY AFTER EVERY 3 WEEKS WITH CHEMO  40 tablet  0  . diclofenac sodium (VOLTAREN) 1 % GEL Apply 1 application topically 4 (four) times daily.       Marland Kitchen diltiazem 2 % GEL Apply 1 application topically 4 (four) times daily.       Marland Kitchen estrogens, conjugated, (PREMARIN)  0.625 MG tablet Take 0.625 mg by mouth daily. Patient takes daily      . fish oil-omega-3 fatty acids 1000 MG capsule Take 1 g by mouth 2 (two) times daily.       . folic acid (FOLVITE) 1 MG tablet Take 1 tablet (1 mg total) by mouth daily.  30 tablet  4  . granisetron (SANCUSO) 3.1 MG/24HR Apply to skin starting 24 hours before chemotherapy. Remove after 7 days.  1 each  0  . LORazepam (ATIVAN) 0.5 MG tablet TAKE 1 TABLET B Y MOUTH OR DISSOLVE 1 TABLET UNDER THE TONGUE EVERY 8 HOURS AS NEEDED FOR NAUSEA  30 tablet  0  . methocarbamol (ROBAXIN) 500 MG tablet       . Multiple Vitamin (MULTIVITAMIN WITH MINERALS) TABS Take 1 tablet by mouth daily.      . nebivolol (BYSTOLIC) 5 MG tablet Take 5 mg by mouth daily with supper.       . ondansetron (ZOFRAN-ODT) 8 MG disintegrating tablet TAKE 1 TABLET UNDER TONGUE EVERY 6 TO 8 HOURS AS  NEEDED FOR NAUSEA/VOMITING  30 tablet  1  . OVER THE COUNTER MEDICATION Take 375 mg by mouth 2 (two) times daily. "Mega Beta Sitosterol 375 mg"      . OVER THE COUNTER MEDICATION Take 3 capsules by mouth daily as needed. "Tart Cherry Ultra"; for gout flare      . predniSONE (DELTASONE) 5 MG tablet       . prochlorperazine (COMPAZINE) 10 MG tablet Take 1 tablet (10 mg total) by mouth every 6 (six) hours as needed.  60 tablet  0  . timolol (TIMOPTIC-XR) 0.5 % ophthalmic gel-forming Place 1 drop into the left eye 2 (two) times daily.        No current facility-administered medications for this visit.    SURGICAL HISTORY:  Past Surgical History  Procedure Laterality Date  . Cystoscopy, transurethral collagen injection therapy    . Left total knee arthroplasty    . Appendectomy    . Hemorrhoid surgery    . Cataract surgery x 2    . Eye surgery    . Tonsillectomy    . Abdominal hysterectomy    . Colonoscopy      REVIEW OF SYSTEMS:  A comprehensive review of systems was negative except for: Constitutional: positive for fatigue Respiratory: positive for dyspnea on exertion Gastrointestinal: positive for nausea   PHYSICAL EXAMINATION: General appearance: alert, cooperative and no distress Head: Normocephalic, without obvious abnormality, atraumatic Neck: no adenopathy Lymph nodes: Cervical, supraclavicular, and axillary nodes normal. Resp: clear to auscultation bilaterally Cardio: regular rate and rhythm, S1, S2 normal, no murmur, click, rub or gallop GI: soft, non-tender; bowel sounds normal; no masses,  no organomegaly Extremities: extremities normal, atraumatic, no cyanosis or edema Neurologic: Alert and oriented X 3, normal strength and tone. Normal symmetric reflexes. Normal coordination and gait Skin: Reveals an area of broken blood vessels beneath the skin in the right mid shin area anteriorly. There is no warmth or palpable cords and no tenderness.  ECOG PERFORMANCE STATUS: 1 -  Symptomatic but completely ambulatory  Blood pressure 166/71, pulse 74, temperature 98.3 F (36.8 C), temperature source Oral, resp. rate 18, height 5\' 1"  (1.549 m), weight 144 lb 11.2 oz (65.635 kg).  LABORATORY DATA: Lab Results  Component Value Date   WBC 6.6 06/07/2012   HGB 9.2* 06/07/2012   HCT 28.8* 06/07/2012   MCV 110.8* 06/07/2012   PLT 237 06/07/2012  Chemistry      Component Value Date/Time   NA 143 06/07/2012 1146   NA 137 03/14/2012 1617   K 4.5 06/07/2012 1146   K 3.8 03/14/2012 1617   CL 106 06/07/2012 1146   CL 98 03/14/2012 1617   CO2 24 06/07/2012 1146   CO2 24 03/14/2012 1617   BUN 15.0 06/07/2012 1146   BUN 38* 03/14/2012 1617   CREATININE 1.1 06/07/2012 1146   CREATININE 1.75* 03/14/2012 1617      Component Value Date/Time   CALCIUM 10.1 06/07/2012 1146   CALCIUM 9.4 03/14/2012 1617   ALKPHOS 158* 06/07/2012 1146   ALKPHOS 82 11/26/2011 1041   AST 83* 06/07/2012 1146   AST 25 11/26/2011 1041   ALT 54 06/07/2012 1146   ALT 15 11/26/2011 1041   BILITOT 0.45 06/07/2012 1146   BILITOT 0.3 11/26/2011 1041       RADIOGRAPHIC STUDIES: Ct Chest Wo Contrast  04/15/2012  *RADIOLOGY REPORT*  Clinical Data:  Lung cancer.  Chemotherapy in progress.  Cough and constipation.  Difficulty swallowing.  CT CHEST, ABDOMEN AND PELVIS WITHOUT CONTRAST  Technique:  Multidetector CT imaging of the chest, abdomen and pelvis was performed following the standard protocol without IV contrast.  Comparison:  02/12/2012. PET 11/26/2011.    CT CHEST  Findings:  Although the largest mediastinal lymph node, in the lower left paratracheal station, is stable at 12 mm, other mediastinal lymph nodes have decreased in size.  Index low right paratracheal lymph node measures 10 mm (previously 13 mm).  Hilar regions are difficult to definitively evaluate without IV contrast. No axillary adenopathy.  Heart is mildly enlarged.  No pericardial effusion.  Coronary artery calcification.  Probable scarring at  the apex of the right upper lobe.  Mild centrilobular emphysema.  Small patchy areas of peribronchovascular ground-glass in the inferior right upper lobe (example image 23) are new and therefore likely infectious/inflammatory in etiology. Lateral segment right middle lobe mass measures 2.1 x 3.3 cm, grossly stable.  Perihilar right middle lobe nodule appears slightly larger, measuring 1.8 x 1.7 cm (previously 1.6 x 1.3 cm). No new nodules.  Linear scarring in the left lower lobe.  No pleural fluid.  Airway is unremarkable.  IMPRESSION:  1.  Mediastinal adenopathy is stable to minimally smaller in size. 2.  Dominant right middle lobe mass is stable.  Adjacent perihilar right middle lobe nodule appears slightly larger.    CT ABDOMEN AND PELVIS  Findings:  3 mm low attenuation lesion in the dome of the liver is unchanged.  Liver, gallbladder and adrenal glands are otherwise unremarkable.  A 5.5 cm exophytic low attenuation lesion off the posterior margin of the right kidney measures slightly smaller, previously 6.2 cm, and is likely a cyst.  Faint calcification in the upper pole left kidney is unchanged from 11/26/2011.  Spleen, pancreas, stomach and small bowel are unremarkable.  Stool is seen in the majority of the colon which is otherwise unremarkable.  Low attenuation lesion in the left adnexa measures 1.8 cm, stable, and is likely associated with left ovary.  Atherosclerotic calcification of the arterial vasculature without abdominal aortic aneurysm.  No pathologically enlarged lymph nodes.  No worrisome lytic or sclerotic lesions.  IMPRESSION:  1.  No evidence of metastatic disease in the abdomen or pelvis. 2.  Constipation. 3.  Stable small low attenuation lesion in the left adnexa, likely associated with the left ovary.   Original Report Authenticated By: Leanna Battles, M.D.  ASSESSMENT/PLAN: This is a very pleasant 79 years old white female with metastatic non-small cell lung cancer, adenocarcinoma  with negative EGFR mutation and negative ALK gene translocation status post 6 cycles of systemic chemotherapy with carboplatin and Alimta with initial response then stable disease after cycle #6. She's currently being treated with maintenance chemotherapy in the form of single agent Alimta at 500 mg per square given every 3 weeks, status post 2 cycles. Patient was discussed with Dr. Arbutus Ped. She'll proceed with cycle #3 of her maintenance chemotherapy with single agent Alimta. She was advised to take her antiemetics as prescribed. She'll followup with Dr. Arbutus Ped in 3 weeks with repeat CBC differential, C. met and CT of the chest, abdomen and pelvis without contrast to reevaluate her disease.   Laural Benes, Kiondre Grenz E, PA-C   All questions were answered. The patient knows to call the clinic with any problems, questions or concerns. We can certainly see the patient much sooner if necessary.  I spent 20 minutes counseling the patient face to face. The total time spent in the appointment was 30 minutes.

## 2012-06-09 NOTE — Progress Notes (Signed)
Followed up with [name] [facility]) billing. Per Teresa Najjar Ms. Flippen received bills because [facility] mailed checks directly to the patient. It is the patient responsibility to forward payment to apply to her doctor billing. Called Ms. Ybanez to advised.

## 2012-06-24 ENCOUNTER — Other Ambulatory Visit: Payer: Medicare Other | Admitting: Lab

## 2012-06-27 ENCOUNTER — Ambulatory Visit (HOSPITAL_COMMUNITY)
Admission: RE | Admit: 2012-06-27 | Discharge: 2012-06-27 | Disposition: A | Discharge status: Home / Self Care | Payer: Medicare Other | Source: Ambulatory Visit | Attending: Physician Assistant | Admitting: Physician Assistant

## 2012-06-27 ENCOUNTER — Encounter (HOSPITAL_COMMUNITY): Payer: Self-pay

## 2012-06-27 DIAGNOSIS — Q619 Cystic kidney disease, unspecified: Secondary | ICD-10-CM | POA: Insufficient documentation

## 2012-06-27 DIAGNOSIS — C3490 Malignant neoplasm of unspecified part of unspecified bronchus or lung: Secondary | ICD-10-CM

## 2012-06-27 DIAGNOSIS — C342 Malignant neoplasm of middle lobe, bronchus or lung: Secondary | ICD-10-CM | POA: Insufficient documentation

## 2012-06-27 DIAGNOSIS — R599 Enlarged lymph nodes, unspecified: Secondary | ICD-10-CM | POA: Insufficient documentation

## 2012-06-27 HISTORY — DX: Malignant (primary) neoplasm, unspecified: C80.1

## 2012-06-29 ENCOUNTER — Ambulatory Visit: Payer: Medicare Other

## 2012-06-29 ENCOUNTER — Other Ambulatory Visit (HOSPITAL_BASED_OUTPATIENT_CLINIC_OR_DEPARTMENT_OTHER): Payer: Medicare Other | Admitting: Lab

## 2012-06-29 ENCOUNTER — Encounter: Payer: Self-pay | Admitting: Internal Medicine

## 2012-06-29 ENCOUNTER — Telehealth: Payer: Self-pay | Admitting: Internal Medicine

## 2012-06-29 ENCOUNTER — Ambulatory Visit (HOSPITAL_BASED_OUTPATIENT_CLINIC_OR_DEPARTMENT_OTHER): Payer: Medicare Other | Admitting: Internal Medicine

## 2012-06-29 ENCOUNTER — Ambulatory Visit: Payer: Medicare Other | Admitting: Lab

## 2012-06-29 DIAGNOSIS — C3430 Malignant neoplasm of lower lobe, unspecified bronchus or lung: Secondary | ICD-10-CM

## 2012-06-29 DIAGNOSIS — C3490 Malignant neoplasm of unspecified part of unspecified bronchus or lung: Secondary | ICD-10-CM

## 2012-06-29 DIAGNOSIS — R5383 Other fatigue: Secondary | ICD-10-CM

## 2012-06-29 DIAGNOSIS — C342 Malignant neoplasm of middle lobe, bronchus or lung: Secondary | ICD-10-CM

## 2012-06-29 LAB — CBC WITH DIFFERENTIAL/PLATELET
Basophils Absolute: 0 10*3/uL (ref 0.0–0.1)
EOS%: 0 % (ref 0.0–7.0)
Eosinophils Absolute: 0 10*3/uL (ref 0.0–0.5)
LYMPH%: 7.7 % — ABNORMAL LOW (ref 14.0–49.7)
MCH: 35.4 pg — ABNORMAL HIGH (ref 25.1–34.0)
MCV: 111.8 fL — ABNORMAL HIGH (ref 79.5–101.0)
MONO%: 6.3 % (ref 0.0–14.0)
Platelets: 315 10*3/uL (ref 145–400)
RBC: 2.46 10*6/uL — ABNORMAL LOW (ref 3.70–5.45)
RDW: 15.3 % — ABNORMAL HIGH (ref 11.2–14.5)
nRBC: 0 % (ref 0–0)

## 2012-06-29 LAB — COMPREHENSIVE METABOLIC PANEL (CC13)
ALT: 37 U/L (ref 0–55)
AST: 70 U/L — ABNORMAL HIGH (ref 5–34)
Albumin: 3.2 g/dL — ABNORMAL LOW (ref 3.5–5.0)
BUN: 22.5 mg/dL (ref 7.0–26.0)
CO2: 23 mEq/L (ref 22–29)
Calcium: 10 mg/dL (ref 8.4–10.4)
Chloride: 107 mEq/L (ref 98–107)
Creatinine: 1.3 mg/dL — ABNORMAL HIGH (ref 0.6–1.1)
Potassium: 4.1 mEq/L (ref 3.5–5.1)

## 2012-06-29 NOTE — Telephone Encounter (Signed)
GV AND PRINTED APPT SCHEDULE TO PT FOR MARCH

## 2012-06-29 NOTE — Progress Notes (Signed)
[facility] Telephone:([phone]   Fax:(336) [phone]  OFFICE PROGRESS NOTE  [name], MD [address] [place] [address]  DIAGNOSIS: Metastatic non-small cell lung cancer, adenocarcinoma with negative EGFR mutation and negative ALK gene translocation diagnosed in August of 2013   PRIOR THERAPY:  1) Systemic chemotherapy with carboplatin for AUC of 5 and Alimta 500 mg/M2 every 3 weeks. The patient is status post 6 cycles.  2) Maintenance chemotherapy with Alimta at 500 mg/M2 every 3 weeks. Status post 3 cycles discontinued today secondary to disease progression.  CURRENT THERAPY: None  INTERVAL HISTORY: [name] 79 y.o. female returns to the clinic today for  followup visit accompanied her daughter and friend. The patient is feeling fine today with no specific complaints except for mild fatigue. She tolerated the last cycle of her systemic chemotherapy with maintenance Alimta fairly well with no significant adverse effect except for mild nausea for a few days after the chemotherapy. She denied having any significant chest pain, shortness breath, cough or hemoptysis. She denied having any fever or chills. The patient had repeat CT scan of the chest, abdomen and pelvis performed recently and she is here for evaluation and discussion of her scan results.  MEDICAL HISTORY: Past Medical History  Diagnosis Date  . Cervical spondylosis   . Renal cyst     simple bilateral 2008  . Diverticulosis   . Vertigo, benign positional   . Hyperlipidemia   . Aortic sclerosis     by exam,  . Erosive osteoarthritis of hand   . History of calcium pyrophosphate deposition disease (CPPD)   . Gout     vs pseudo gout flares in past  . Mediastinal adenopathy   . Tobacco use   . COPD, mild   . Glaucoma   . Hypertension   . Osteopenia   . Hiatal hernia   . Hemorrhoids   . H/O urinary incontinence   . Childhood asthma   . Bronchitis   . TIA  (transient ischemic attack)   . Claustrophobia   . Dizziness   . Bleeding from the nose     hx of severe nose bleeds-last time was 2 yrs ago, slight bleed last week  . lung ca 11/2011    ALLERGIES:  is allergic to shellfish allergy; septra ds; statins; tape; and wellbutrin.  MEDICATIONS:  Current Outpatient Prescriptions  Medication Sig Dispense Refill  . ADVAIR DISKUS 250-50 MCG/DOSE AEPB       . ALPRAZolam (XANAX) 0.25 MG tablet       . aspirin 81 MG tablet Take 81 mg by mouth daily.      . B Complex-C (B-COMPLEX WITH VITAMIN C) tablet Take 1 tablet by mouth daily.      . Cholecalciferol (VITAMIN D) 2000 UNITS tablet Take 2,000 Units by mouth daily.      . colchicine 0.6 MG tablet Take 0.6 mg by mouth daily as needed. For gout flare      . dexamethasone (DECADRON) 4 MG tablet TAKE 1 TAB 2X A DAY W/ A MEAL DAY BEFORE CHEMO/DAY OF/AND DAY AFTER EVERY 3 WEEKS WITH CHEMO  40 tablet  0  . diltiazem 2 % GEL Apply 1 application topically 4 (four) times daily.       Marland Kitchen estrogens, conjugated, (PREMARIN) 0.625 MG tablet Take 0.625 mg by mouth daily. Patient takes daily      . fish oil-omega-3 fatty acids 1000 MG capsule  Take 1 g by mouth 2 (two) times daily.       . folic acid (FOLVITE) 1 MG tablet Take 1 tablet (1 mg total) by mouth daily.  30 tablet  4  . granisetron (SANCUSO) 3.1 MG/24HR Apply to skin starting 24 hours before chemotherapy. Remove after 7 days.  1 each  0  . LORazepam (ATIVAN) 0.5 MG tablet TAKE 1 TABLET B Y MOUTH OR DISSOLVE 1 TABLET UNDER THE TONGUE EVERY 8 HOURS AS NEEDED FOR NAUSEA  30 tablet  0  . methocarbamol (ROBAXIN) 500 MG tablet       . Multiple Vitamin (MULTIVITAMIN WITH MINERALS) TABS Take 1 tablet by mouth daily.      . nebivolol (BYSTOLIC) 5 MG tablet Take 5 mg by mouth daily with supper.       . ondansetron (ZOFRAN-ODT) 8 MG disintegrating tablet TAKE 1 TABLET UNDER TONGUE EVERY 6 TO 8 HOURS AS NEEDED FOR NAUSEA/VOMITING  30 tablet  1  . OVER THE COUNTER  MEDICATION Take 375 mg by mouth 2 (two) times daily. "Mega Beta Sitosterol 375 mg"      . OVER THE COUNTER MEDICATION Take 3 capsules by mouth daily as needed. "Tart Cherry Ultra"; for gout flare      . timolol (TIMOPTIC-XR) 0.5 % ophthalmic gel-forming Place 1 drop into the left eye 2 (two) times daily.       . diclofenac sodium (VOLTAREN) 1 % GEL Apply 1 application topically 4 (four) times daily.       . predniSONE (DELTASONE) 5 MG tablet       . prochlorperazine (COMPAZINE) 10 MG tablet Take 1 tablet (10 mg total) by mouth every 6 (six) hours as needed.  60 tablet  0   No current facility-administered medications for this visit.    SURGICAL HISTORY:  Past Surgical History  Procedure Laterality Date  . Cystoscopy, transurethral collagen injection therapy    . Left total knee arthroplasty    . Appendectomy    . Hemorrhoid surgery    . Cataract surgery x 2    . Eye surgery    . Tonsillectomy    . Abdominal hysterectomy    . Colonoscopy      REVIEW OF SYSTEMS:  A comprehensive review of systems was negative except for: Constitutional: positive for fatigue Neurological: positive for coordination problems   PHYSICAL EXAMINATION: General appearance: alert, cooperative, fatigued and no distress Head: Normocephalic, without obvious abnormality, atraumatic Neck: no adenopathy Resp: clear to auscultation bilaterally Cardio: regular rate and rhythm, S1, S2 normal, no murmur, click, rub or gallop GI: soft, non-tender; bowel sounds normal; no masses,  no organomegaly Extremities: extremities normal, atraumatic, no cyanosis or edema Neurologic: Alert and oriented X 3, normal strength and tone. Normal symmetric reflexes. Normal coordination and gait  ECOG PERFORMANCE STATUS: 1 - Symptomatic but completely ambulatory  Blood pressure 168/87, pulse 86, temperature 97 F (36.1 C), temperature source Oral, resp. rate 20, height 5\' 1"  (1.549 m), weight 142 lb 6.4 oz (64.592 kg).  LABORATORY  DATA: Lab Results  Component Value Date   WBC 9.6 06/29/2012   HGB 8.7* 06/29/2012   HCT 27.5* 06/29/2012   MCV 111.8* 06/29/2012   PLT 315 06/29/2012      Chemistry      Component Value Date/Time   NA 143 06/07/2012 1146   NA 137 03/14/2012 1617   K 4.5 06/07/2012 1146   K 3.8 03/14/2012 1617   CL 106 06/07/2012 1146  CL 98 03/14/2012 1617   CO2 24 06/07/2012 1146   CO2 24 03/14/2012 1617   BUN 15.0 06/07/2012 1146   BUN 38* 03/14/2012 1617   CREATININE 1.1 06/07/2012 1146   CREATININE 1.75* 03/14/2012 1617      Component Value Date/Time   CALCIUM 10.1 06/07/2012 1146   CALCIUM 9.4 03/14/2012 1617   ALKPHOS 158* 06/07/2012 1146   ALKPHOS 82 11/26/2011 1041   AST 83* 06/07/2012 1146   AST 25 11/26/2011 1041   ALT 54 06/07/2012 1146   ALT 15 11/26/2011 1041   BILITOT 0.45 06/07/2012 1146   BILITOT 0.3 11/26/2011 1041       RADIOGRAPHIC STUDIES: Ct Chest Wo Contrast  06/27/2012  *RADIOLOGY REPORT*  Clinical Data:  Lung cancer diagnosed 2013.  Chemotherapy ongoing  CT CHEST, ABDOMEN AND PELVIS WITHOUT CONTRAST  Technique:  Multidetector CT imaging of the chest, abdomen and pelvis was performed following the standard protocol without IV contrast.  Comparison:  CT 04/15/2012   CT CHEST  Findings:  No axillary lymphadenopathy.  Right supraclavicular lymph node measuring 17 mm short axis (image #6) is new from prior. In the mediastinum, interval enlargement of right lower paratracheal lymph node measuring 25 mm short axis increased from 10 mm on prior.  Subcarinal o/right peribronchial lymph node measures 25 mm short axis compared to 15 mm on prior.  The dominant mass lesion in the right middle lobe is increased in size measuring 44 mm x 37 mm compared to 33 x 21 mm on prior. The dominant mass does have a nodular cavitary component which is also increased compared to prior (image 31). Second nodule in the right middle lobe measuring 19 mm by 16 mm is increased slightly from17 mm x 15 mm on prior.   IMPRESSION:  1.  Progression / recurrence of lung cancer with increased in size of right middle lobe mass and nodule. 2.  New mediastinal and right supraclavicular metastatic lymphadenopathy.   CT ABDOMEN AND PELVIS  Findings:  No focal hepatic lesion on this noncontrast exam.  The gallbladder, pancreas, spleen, adrenal glands, and kidneys are unchanged.  There is a large cyst extending from the right kidney which has some dependent debris and is  slightly decreased in size compared to prior measuring 41 mm.  The stomach, small bowel, and colon are normal.  Abdominal aorta normal caliber.  No retroperitoneal periportal lymphadenopathy.  No free fluid the pelvis. Post hysterectomy.  Uterus is and ovaries are normal.   No pelvic lymphadenopathy. Review of  bone windows demonstrates no aggressive osseous lesions.  IMPRESSION:  1.  No evidence of metastasis in the abdomen pelvis on noncontrast exam. 2.  Contraction of benign-appearing right renal cyst. 3.  Please see above chest section for progression of lung cancer.   Original Report Authenticated By: Genevive Bi, M.D.     ASSESSMENT: this is a very pleasant 79 years old white female with metastatic non-small cell lung cancer, adenocarcinoma with negative EGFR mutation, negative ALK gene translocation. She is status post 6 cycles of induction chemotherapy with carboplatin and Alimta followed by 3 cycles of maintenance Alimta but unfortunately has evidence for disease progression after the third cycle. The patient is feeling fine today except for fatigue and occasional balance problem.   PLAN: I have a lengthy discussion with the patient and her family today about her current disease status , prognosis and treatment options. I showed them the images of the CT scans. I would consider the patient  for second line treatment with either Tarceva or chemotherapy. I recommended for her to do a Veristrat test first evaluate her response to Tarceva. If the patient  has Veristrat good, I would consider her for treatment with second line Tarceva 150 mg by mouth daily, but the Veristrat test is poor, should be considered for systemic chemotherapy with either Taxotere or gemcitabine.  The patient and her family agreed to the current plan She would come back for followup visit in one week for reevaluation and more detailed discussion of her treatment options based on the Veristrat test result.  All questions were answered. The patient knows to call the clinic with any problems, questions or concerns. We can certainly see the patient much sooner if necessary.  I spent 15 minutes counseling the patient face to face. The total time spent in the appointment was 25 minutes.

## 2012-06-29 NOTE — Patient Instructions (Signed)
Your CT scan of the chest, abdomen and pelvis showed evidence for disease progression. We discussed treatment options including Tarceva versus chemotherapy. I will order a Veristrat test first to evaluate your response to Tarceva. Followup in 1 week

## 2012-07-04 LAB — VERISTRAT

## 2012-07-06 ENCOUNTER — Ambulatory Visit (HOSPITAL_BASED_OUTPATIENT_CLINIC_OR_DEPARTMENT_OTHER): Payer: Medicare Other | Admitting: Physician Assistant

## 2012-07-06 ENCOUNTER — Other Ambulatory Visit: Payer: Self-pay | Admitting: *Deleted

## 2012-07-06 ENCOUNTER — Encounter: Payer: Self-pay | Admitting: Physician Assistant

## 2012-07-06 ENCOUNTER — Other Ambulatory Visit (HOSPITAL_BASED_OUTPATIENT_CLINIC_OR_DEPARTMENT_OTHER): Payer: Medicare Other | Admitting: Lab

## 2012-07-06 ENCOUNTER — Telehealth: Payer: Self-pay | Admitting: Internal Medicine

## 2012-07-06 DIAGNOSIS — C778 Secondary and unspecified malignant neoplasm of lymph nodes of multiple regions: Secondary | ICD-10-CM

## 2012-07-06 DIAGNOSIS — C342 Malignant neoplasm of middle lobe, bronchus or lung: Secondary | ICD-10-CM

## 2012-07-06 LAB — CBC WITH DIFFERENTIAL/PLATELET
Basophils Absolute: 0 10*3/uL (ref 0.0–0.1)
EOS%: 1.5 % (ref 0.0–7.0)
HGB: 9.3 g/dL — ABNORMAL LOW (ref 11.6–15.9)
MCH: 35.5 pg — ABNORMAL HIGH (ref 25.1–34.0)
MCV: 113.4 fL — ABNORMAL HIGH (ref 79.5–101.0)
MONO%: 18.9 % — ABNORMAL HIGH (ref 0.0–14.0)
NEUT#: 3.6 10*3/uL (ref 1.5–6.5)
RBC: 2.62 10*6/uL — ABNORMAL LOW (ref 3.70–5.45)
RDW: 15.8 % — ABNORMAL HIGH (ref 11.2–14.5)
lymph#: 1.4 10*3/uL (ref 0.9–3.3)
nRBC: 0 % (ref 0–0)

## 2012-07-06 MED ORDER — ERLOTINIB HCL 150 MG PO TABS: 150.0000 mg | ORAL_TABLET | Freq: Every day | ORAL | Status: DC

## 2012-07-06 NOTE — Patient Instructions (Addendum)
Your Veristrat test results came back as Veristrat good Your being started on Tarceva at 150 mg by mouth daily Followup in approximately 3 weeks for a symptom management visit

## 2012-07-06 NOTE — Telephone Encounter (Signed)
Insurance required this to be filled by a Psychologist, occupational.  Faxed to [employer] at [phone].

## 2012-07-07 ENCOUNTER — Telehealth: Payer: Self-pay | Admitting: *Deleted

## 2012-07-07 ENCOUNTER — Other Ambulatory Visit: Payer: Self-pay | Admitting: *Deleted

## 2012-07-07 MED ORDER — ERLOTINIB HCL 150 MG PO TABS: 150.0000 mg | ORAL_TABLET | Freq: Every day | ORAL | Status: DC

## 2012-07-07 NOTE — Progress Notes (Signed)
[facility] [phone]   Fax:([phone]  OFFICE PROGRESS NOTE  [name], MD [address] [place] [address]  DIAGNOSIS: Metastatic non-small cell lung cancer, adenocarcinoma with negative EGFR mutation and negative ALK gene translocation diagnosed in August of 2013   PRIOR THERAPY:  1) Systemic chemotherapy with carboplatin for AUC of 5 and Alimta 500 mg/M2 every 3 weeks. The patient is status post 6 cycles.  2) Maintenance chemotherapy with Alimta at 500 mg/M2 every 3 weeks. Status post 3 cycles discontinued today secondary to disease progression.  CURRENT THERAPY: None  INTERVAL HISTORY: [name] 79 y.o. female returns to the clinic today for  followup visit accompanied her daughter and son-in-law.  The patient is feeling fine today with no specific complaints except for mild fatigue. She tolerated the last cycle of her systemic chemotherapy with maintenance Alimta fairly well with no significant adverse effect except for mild nausea for a few days after the chemotherapy. She denied having any significant chest pain, shortness breath, cough or hemoptysis. She denied having any fever or chills. She had a Veristrat test performed and presents today to discuss the results of that study.   MEDICAL HISTORY: Past Medical History  Diagnosis Date  . Cervical spondylosis   . Renal cyst     simple bilateral 2008  . Diverticulosis   . Vertigo, benign positional   . Hyperlipidemia   . Aortic sclerosis     by exam,  . Erosive osteoarthritis of hand   . History of calcium pyrophosphate deposition disease (CPPD)   . Gout     vs pseudo gout flares in past  . Mediastinal adenopathy   . Tobacco use   . COPD, mild   . Glaucoma   . Hypertension   . Osteopenia   . Hiatal hernia   . Hemorrhoids   . H/O urinary incontinence   . Childhood asthma   . Bronchitis   . TIA (transient ischemic attack)   . Claustrophobia   .  Dizziness   . Bleeding from the nose     hx of severe nose bleeds-last time was 2 yrs ago, slight bleed last week  . lung ca 11/2011    ALLERGIES:  is allergic to shellfish allergy; septra ds; statins; tape; and wellbutrin.  MEDICATIONS:  Current Outpatient Prescriptions  Medication Sig Dispense Refill  . ADVAIR DISKUS 250-50 MCG/DOSE AEPB       . ALPRAZolam (XANAX) 0.25 MG tablet       . aspirin 81 MG tablet Take 81 mg by mouth daily.      . B Complex-C (B-COMPLEX WITH VITAMIN C) tablet Take 1 tablet by mouth daily.      . Cholecalciferol (VITAMIN D) 2000 UNITS tablet Take 2,000 Units by mouth daily.      . colchicine 0.6 MG tablet Take 0.6 mg by mouth daily as needed. For gout flare      . diclofenac sodium (VOLTAREN) 1 % GEL Apply 1 application topically 4 (four) times daily.       Marland Kitchen diltiazem 2 % GEL Apply 1 application topically 4 (four) times daily.       Marland Kitchen erlotinib (TARCEVA) 150 MG tablet Take 1 tablet (150 mg total) by mouth daily. Take on an empty stomach 1 hour before meals or 2 hours after.  30 tablet  2  . estrogens, conjugated, (PREMARIN) 0.625 MG tablet Take 0.625 mg by mouth daily. Patient  takes daily      . fish oil-omega-3 fatty acids 1000 MG capsule Take 1 g by mouth 2 (two) times daily.       . folic acid (FOLVITE) 1 MG tablet Take 1 tablet (1 mg total) by mouth daily.  30 tablet  4  . ipratropium (ATROVENT) 0.06 % nasal spray Place 2 sprays into the nose 4 (four) times daily as needed.       Marland Kitchen LORazepam (ATIVAN) 0.5 MG tablet TAKE 1 TABLET B Y MOUTH OR DISSOLVE 1 TABLET UNDER THE TONGUE EVERY 8 HOURS AS NEEDED FOR NAUSEA  30 tablet  0  . Multiple Vitamin (MULTIVITAMIN WITH MINERALS) TABS Take 1 tablet by mouth daily.      . nebivolol (BYSTOLIC) 5 MG tablet Take 5 mg by mouth daily with supper.       . ondansetron (ZOFRAN-ODT) 8 MG disintegrating tablet TAKE 1 TABLET UNDER TONGUE EVERY 6 TO 8 HOURS AS NEEDED FOR NAUSEA/VOMITING  30 tablet  1  . prochlorperazine  (COMPAZINE) 10 MG tablet Take 1 tablet (10 mg total) by mouth every 6 (six) hours as needed.  60 tablet  0  . timolol (TIMOPTIC-XR) 0.5 % ophthalmic gel-forming Place 1 drop into the left eye 2 (two) times daily.        No current facility-administered medications for this visit.    SURGICAL HISTORY:  Past Surgical History  Procedure Laterality Date  . Cystoscopy, transurethral collagen injection therapy    . Left total knee arthroplasty    . Appendectomy    . Hemorrhoid surgery    . Cataract surgery x 2    . Eye surgery    . Tonsillectomy    . Abdominal hysterectomy    . Colonoscopy      REVIEW OF SYSTEMS:  A comprehensive review of systems was negative except for: Constitutional: positive for fatigue   PHYSICAL EXAMINATION: General appearance: alert, cooperative, fatigued and no distress Head: Normocephalic, without obvious abnormality, atraumatic Neck: no adenopathy Resp: clear to auscultation bilaterally Cardio: regular rate and rhythm, S1, S2 normal, no murmur, click, rub or gallop GI: soft, non-tender; bowel sounds normal; no masses,  no organomegaly Extremities: extremities normal, atraumatic, no cyanosis or edema Neurologic: Alert and oriented X 3, normal strength and tone. Normal symmetric reflexes. Normal coordination and gait  ECOG PERFORMANCE STATUS: 1 - Symptomatic but completely ambulatory  Blood pressure 163/60, pulse 76, temperature 98.2 F (36.8 C), temperature source Oral, resp. rate 18, height 5\' 1"  (1.549 m), weight 140 lb 11.2 oz (63.821 kg).  LABORATORY DATA: Lab Results  Component Value Date   WBC 6.2 07/06/2012   HGB 9.3* 07/06/2012   HCT 29.7* 07/06/2012   MCV 113.4* 07/06/2012   PLT 231 07/06/2012      Chemistry      Component Value Date/Time   NA 141 06/29/2012 1125   NA 137 03/14/2012 1617   K 4.1 06/29/2012 1125   K 3.8 03/14/2012 1617   CL 107 06/29/2012 1125   CL 98 03/14/2012 1617   CO2 23 06/29/2012 1125   CO2 24 03/14/2012 1617   BUN  22.5 06/29/2012 1125   BUN 38* 03/14/2012 1617   CREATININE 1.3* 06/29/2012 1125   CREATININE 1.75* 03/14/2012 1617      Component Value Date/Time   CALCIUM 10.0 06/29/2012 1125   CALCIUM 9.4 03/14/2012 1617   ALKPHOS 140 06/29/2012 1125   ALKPHOS 82 11/26/2011 1041   AST 70* 06/29/2012 1125  AST 25 11/26/2011 1041   ALT 37 06/29/2012 1125   ALT 15 11/26/2011 1041   BILITOT 0.44 06/29/2012 1125   BILITOT 0.3 11/26/2011 1041       RADIOGRAPHIC STUDIES: Ct Chest Wo Contrast  06/27/2012  *RADIOLOGY REPORT*  Clinical Data:  Lung cancer diagnosed 2013.  Chemotherapy ongoing  CT CHEST, ABDOMEN AND PELVIS WITHOUT CONTRAST  Technique:  Multidetector CT imaging of the chest, abdomen and pelvis was performed following the standard protocol without IV contrast.  Comparison:  CT 04/15/2012   CT CHEST  Findings:  No axillary lymphadenopathy.  Right supraclavicular lymph node measuring 17 mm short axis (image #6) is new from prior. In the mediastinum, interval enlargement of right lower paratracheal lymph node measuring 25 mm short axis increased from 10 mm on prior.  Subcarinal o/right peribronchial lymph node measures 25 mm short axis compared to 15 mm on prior.  The dominant mass lesion in the right middle lobe is increased in size measuring 44 mm x 37 mm compared to 33 x 21 mm on prior. The dominant mass does have a nodular cavitary component which is also increased compared to prior (image 31). Second nodule in the right middle lobe measuring 19 mm by 16 mm is increased slightly from17 mm x 15 mm on prior.  IMPRESSION:  1.  Progression / recurrence of lung cancer with increased in size of right middle lobe mass and nodule. 2.  New mediastinal and right supraclavicular metastatic lymphadenopathy.   CT ABDOMEN AND PELVIS  Findings:  No focal hepatic lesion on this noncontrast exam.  The gallbladder, pancreas, spleen, adrenal glands, and kidneys are unchanged.  There is a large cyst extending from the right kidney  which has some dependent debris and is  slightly decreased in size compared to prior measuring 41 mm.  The stomach, small bowel, and colon are normal.  Abdominal aorta normal caliber.  No retroperitoneal periportal lymphadenopathy.  No free fluid the pelvis. Post hysterectomy.  Uterus is and ovaries are normal.   No pelvic lymphadenopathy. Review of  bone windows demonstrates no aggressive osseous lesions.  IMPRESSION:  1.  No evidence of metastasis in the abdomen pelvis on noncontrast exam. 2.  Contraction of benign-appearing right renal cyst. 3.  Please see above chest section for progression of lung cancer.   Original Report Authenticated By: Genevive Bi, M.D.     ASSESSMENT/PLAN: this is a very pleasant 79 years old white female with metastatic non-small cell lung cancer, adenocarcinoma with negative EGFR mutation, negative ALK gene translocation. She is status post 6 cycles of induction chemotherapy with carboplatin and Alimta followed by 3 cycles of maintenance Alimta but unfortunately has evidence for disease progression after the third cycle. The patient is feeling fine today except for fatigue. The very strep test result came back as very stressed good indicating the patient would have a favorable response to a tyrosine kinase inhibitor such as Tarceva. Patient was discussed with Dr. Arbutus Ped. She'll be started on Tarceva at 150 mg by mouth daily. Side effects of the medication discussed with the patient including but not limited to rash, diarrhea, fatigue, and nausea. A prescription was sent to the [facility] outpatient pharmacy for the Tarceva. If they're unable to filll that due to her insurance we will utilize [facility] or the specialty. pharmacy designated by her insurance coverage. We will plan to have the patient come back in approximately 3 weeks for a symptom management visit with a repeat CBC differential and  C. met. The patient and her family are in agreement with her starting Tarceva  therapy.  Conni Slipper, PA_C   All questions were answered. The patient knows to call the clinic with any problems, questions or concerns. We can certainly see the patient much sooner if necessary.  I spent 20 minutes counseling the patient face to face. The total time spent in the appointment was 30 minutes.

## 2012-07-07 NOTE — Telephone Encounter (Signed)
RECEIVED A FAX FROM [facility] CONCERNING A NOTICE OF RX TRANSFER TO [facility].

## 2012-07-07 NOTE — Telephone Encounter (Signed)
Received msg from Leah at [facility] that pt has requested to have rx transferred to [facility].  Per [facility], information and rx was faxed.  SLJ

## 2012-07-08 ENCOUNTER — Other Ambulatory Visit: Payer: Self-pay | Admitting: *Deleted

## 2012-07-08 MED ORDER — ERLOTINIB HCL 150 MG PO TABS: 150.0000 mg | ORAL_TABLET | Freq: Every day | ORAL | Status: DC

## 2012-07-11 ENCOUNTER — Encounter: Payer: Self-pay | Admitting: *Deleted

## 2012-07-11 NOTE — Progress Notes (Signed)
RECEIVED A FAX FROM [facility] CONCERNING A CONFIRMATION THAT TARCEVA WAS DISPENSED ON 07/08/12.

## 2012-07-18 ENCOUNTER — Telehealth: Payer: Self-pay | Admitting: Medical Oncology

## 2012-07-18 NOTE — Telephone Encounter (Signed)
Back pain is better after starting gout medicine last week. Is it okay to stay on gout med indefinitely with Tarceva? She has diarrhea and is taking imodium and it is controlling  . Antiemetic is controlling nausea. I gave diaghter information for skin rash and creams for pt to use.

## 2012-07-20 ENCOUNTER — Other Ambulatory Visit (HOSPITAL_BASED_OUTPATIENT_CLINIC_OR_DEPARTMENT_OTHER): Payer: Medicare Other | Admitting: Lab

## 2012-07-20 ENCOUNTER — Telehealth: Payer: Self-pay | Admitting: *Deleted

## 2012-07-20 DIAGNOSIS — C3490 Malignant neoplasm of unspecified part of unspecified bronchus or lung: Secondary | ICD-10-CM

## 2012-07-20 DIAGNOSIS — C342 Malignant neoplasm of middle lobe, bronchus or lung: Secondary | ICD-10-CM

## 2012-07-20 LAB — CBC WITH DIFFERENTIAL/PLATELET
BASO%: 0.8 % (ref 0.0–2.0)
EOS%: 5.7 % (ref 0.0–7.0)
HCT: 29.8 % — ABNORMAL LOW (ref 34.8–46.6)
LYMPH%: 23.1 % (ref 14.0–49.7)
MCH: 35.3 pg — ABNORMAL HIGH (ref 25.1–34.0)
MCHC: 32.5 g/dL (ref 31.5–36.0)
NEUT%: 55.4 % (ref 38.4–76.8)
RBC: 2.75 10*6/uL — ABNORMAL LOW (ref 3.70–5.45)
WBC: 5.2 10*3/uL (ref 3.9–10.3)
lymph#: 1.2 10*3/uL (ref 0.9–3.3)

## 2012-07-20 LAB — COMPREHENSIVE METABOLIC PANEL (CC13)
ALT: 23 U/L (ref 0–55)
AST: 48 U/L — ABNORMAL HIGH (ref 5–34)
Chloride: 108 mEq/L — ABNORMAL HIGH (ref 98–107)
Creatinine: 2.3 mg/dL — ABNORMAL HIGH (ref 0.6–1.1)
Sodium: 140 mEq/L (ref 136–145)
Total Bilirubin: 0.7 mg/dL (ref 0.20–1.20)
Total Protein: 7.8 g/dL (ref 6.4–8.3)

## 2012-07-20 NOTE — Telephone Encounter (Signed)
I told daughter that pt should not take gout medicine every day for prevention of gout only during flare ups of gout.

## 2012-07-27 ENCOUNTER — Telehealth: Payer: Self-pay | Admitting: Physician Assistant

## 2012-07-27 ENCOUNTER — Other Ambulatory Visit (HOSPITAL_BASED_OUTPATIENT_CLINIC_OR_DEPARTMENT_OTHER): Payer: Medicare Other | Admitting: Lab

## 2012-07-27 ENCOUNTER — Ambulatory Visit: Payer: Medicare Other

## 2012-07-27 ENCOUNTER — Other Ambulatory Visit: Payer: Self-pay | Admitting: Oncology

## 2012-07-27 ENCOUNTER — Ambulatory Visit (HOSPITAL_BASED_OUTPATIENT_CLINIC_OR_DEPARTMENT_OTHER): Payer: Medicare Other | Admitting: Physician Assistant

## 2012-07-27 DIAGNOSIS — R5381 Other malaise: Secondary | ICD-10-CM

## 2012-07-27 DIAGNOSIS — C342 Malignant neoplasm of middle lobe, bronchus or lung: Secondary | ICD-10-CM

## 2012-07-27 DIAGNOSIS — R110 Nausea: Secondary | ICD-10-CM

## 2012-07-27 DIAGNOSIS — K1230 Oral mucositis (ulcerative), unspecified: Secondary | ICD-10-CM

## 2012-07-27 LAB — COMPREHENSIVE METABOLIC PANEL (CC13)
ALT: 25 U/L (ref 0–55)
AST: 56 U/L — ABNORMAL HIGH (ref 5–34)
Alkaline Phosphatase: 123 U/L (ref 40–150)
CO2: 22 mEq/L (ref 22–29)
Creatinine: 1.5 mg/dL — ABNORMAL HIGH (ref 0.6–1.1)
Total Bilirubin: 0.86 mg/dL (ref 0.20–1.20)

## 2012-07-27 LAB — CBC WITH DIFFERENTIAL/PLATELET
BASO%: 0.2 % (ref 0.0–2.0)
EOS%: 3.5 % (ref 0.0–7.0)
HCT: 31.6 % — ABNORMAL LOW (ref 34.8–46.6)
LYMPH%: 20.3 % (ref 14.0–49.7)
MCH: 33.6 pg (ref 25.1–34.0)
MCHC: 31.3 g/dL — ABNORMAL LOW (ref 31.5–36.0)
MONO%: 9.1 % (ref 0.0–14.0)
NEUT%: 66.9 % (ref 38.4–76.8)
Platelets: 219 10*3/uL (ref 145–400)

## 2012-07-27 MED ORDER — ONDANSETRON 8 MG PO TBDP: ORAL_TABLET | ORAL | Status: DC

## 2012-07-27 MED ORDER — MAGIC MOUTHWASH: 5.0000 mL | Freq: Four times a day (QID) | ORAL | Status: DC | PRN

## 2012-07-27 NOTE — Patient Instructions (Addendum)
Continue taking Tarceva 150 mg by mouth daily Followup in 2 weeks for another symptom management visit 

## 2012-07-27 NOTE — Telephone Encounter (Signed)
gv and printed appt schedule to pt for April...pt is on oral chemo...no more tx

## 2012-07-29 ENCOUNTER — Telehealth: Payer: Self-pay | Admitting: *Deleted

## 2012-07-29 MED ORDER — MAGIC MOUTHWASH: 5.0000 mL | Freq: Four times a day (QID) | ORAL | Status: DC | PRN

## 2012-07-29 MED ORDER — ONDANSETRON 8 MG PO TBDP: ORAL_TABLET | ORAL | Status: DC

## 2012-07-29 NOTE — Telephone Encounter (Signed)
Pt left a voice mail that she has received a letter from [facility] regarding her Harless Nakayama. States they are asking for info on her prescribing MD.  Attempted to call pt back, line busy

## 2012-07-31 NOTE — Progress Notes (Signed)
[facility] [phone]   Fax:([phone]  OFFICE PROGRESS NOTE  [name], MD [address] [place] [address]  DIAGNOSIS: Metastatic non-small cell lung cancer, adenocarcinoma with negative EGFR mutation and negative ALK gene translocation diagnosed in August of 2013   PRIOR THERAPY:  1) Systemic chemotherapy with carboplatin for AUC of 5 and Alimta 500 mg/M2 every 3 weeks. The patient is status post 6 cycles.  2) Maintenance chemotherapy with Alimta at 500 mg/M2 every 3 weeks. Status post 3 cycles discontinued today secondary to disease progression.  CURRENT THERAPY: Tarceva 150 mg by mouth daily, status post 2 weeks of therapy. Therapy beginning 3/22/20114.  INTERVAL HISTORY: [name] 79 y.o. female returns to the clinic today for symptom management visit after starting Tarceva therapy approximately 2 weeks ago. She has had some intermittent episodes of diarrhea and has developed a grade 1 to grade 2 skin rash related to the Tarceva therapy. She also developed some tongue and mouth sores. She otherwise is tolerating the Tarceva relatively well. She denied having any significant chest pain, shortness breath, cough or hemoptysis. She denied having any fever or chills. She continues to have some episodes of nausea and requests a refill for her Zofran  MEDICAL HISTORY: Past Medical History  Diagnosis Date  . Cervical spondylosis   . Renal cyst     simple bilateral 2008  . Diverticulosis   . Vertigo, benign positional   . Hyperlipidemia   . Aortic sclerosis     by exam,  . Erosive osteoarthritis of hand   . History of calcium pyrophosphate deposition disease (CPPD)   . Gout     vs pseudo gout flares in past  . Mediastinal adenopathy   . Tobacco use   . COPD, mild   . Glaucoma   . Hypertension   . Osteopenia   . Hiatal hernia   . Hemorrhoids   . H/O urinary incontinence   . Childhood asthma   . Bronchitis    . TIA (transient ischemic attack)   . Claustrophobia   . Dizziness   . Bleeding from the nose     hx of severe nose bleeds-last time was 2 yrs ago, slight bleed last week  . lung ca 11/2011    ALLERGIES:  is allergic to shellfish allergy; septra ds; statins; tape; and wellbutrin.  MEDICATIONS:  Current Outpatient Prescriptions  Medication Sig Dispense Refill  . ADVAIR DISKUS 250-50 MCG/DOSE AEPB       . ALPRAZolam (XANAX) 0.25 MG tablet       . Alum & Mag Hydroxide-Simeth (MAGIC MOUTHWASH) SOLN Take 5 mLs by mouth 4 (four) times daily as needed.  160 mL  0  . aspirin 81 MG tablet Take 81 mg by mouth daily.      . B Complex-C (B-COMPLEX WITH VITAMIN C) tablet Take 1 tablet by mouth daily.      . Cholecalciferol (VITAMIN D) 2000 UNITS tablet Take 2,000 Units by mouth daily.      . diclofenac sodium (VOLTAREN) 1 % GEL Apply 1 application topically 4 (four) times daily.       Marland Kitchen diltiazem 2 % GEL Apply 1 application topically 4 (four) times daily.       Marland Kitchen erlotinib (TARCEVA) 150 MG tablet Take 1 tablet (150 mg total) by mouth daily. Take on an empty stomach 1 hour before meals or 2 hours after.  30 tablet  2  .  estrogens, conjugated, (PREMARIN) 0.625 MG tablet Take 0.625 mg by mouth daily. Patient takes daily      . fish oil-omega-3 fatty acids 1000 MG capsule Take 1 g by mouth 2 (two) times daily.       Marland Kitchen ipratropium (ATROVENT) 0.06 % nasal spray Place 2 sprays into the nose 4 (four) times daily as needed.       Marland Kitchen LORazepam (ATIVAN) 0.5 MG tablet TAKE 1 TABLET B Y MOUTH OR DISSOLVE 1 TABLET UNDER THE TONGUE EVERY 8 HOURS AS NEEDED FOR NAUSEA  30 tablet  0  . Multiple Vitamin (MULTIVITAMIN WITH MINERALS) TABS Take 1 tablet by mouth daily.      . nebivolol (BYSTOLIC) 5 MG tablet Take 5 mg by mouth daily with supper.       . ondansetron (ZOFRAN-ODT) 8 MG disintegrating tablet TAKE 1 TABLET UNDER TONGUE EVERY 6 TO 8 HOURS AS NEEDED FOR NAUSEA/VOMITING  30 tablet  1  . prochlorperazine  (COMPAZINE) 10 MG tablet Take 1 tablet (10 mg total) by mouth every 6 (six) hours as needed.  60 tablet  0  . timolol (TIMOPTIC-XR) 0.5 % ophthalmic gel-forming Place 1 drop into the left eye 2 (two) times daily.        No current facility-administered medications for this visit.    SURGICAL HISTORY:  Past Surgical History  Procedure Laterality Date  . Cystoscopy, transurethral collagen injection therapy    . Left total knee arthroplasty    . Appendectomy    . Hemorrhoid surgery    . Cataract surgery x 2    . Eye surgery    . Tonsillectomy    . Abdominal hysterectomy    . Colonoscopy      REVIEW OF SYSTEMS:  A comprehensive review of systems was negative except for: Ears, nose, mouth, throat, and face: positive for sore mouth Gastrointestinal: positive for diarrhea acneform skin rash   PHYSICAL EXAMINATION: General appearance: alert, cooperative, fatigued and no distress Head: Normocephalic, without obvious abnormality, atraumatic Neck: no adenopathy Resp: clear to auscultation bilaterally Cardio: regular rate and rhythm, S1, S2 normal, no murmur, click, rub or gallop GI: soft, non-tender; bowel sounds normal; no masses,  no organomegaly Extremities: extremities normal, atraumatic, no cyanosis or edema Neurologic: Alert and oriented X 3, normal strength and tone. Normal symmetric reflexes. Normal coordination and gait Skin: erythematous acneform eruptions affecting the face- chin, nose cheeks and to a lesser extent the forehead. A few lesions on the anterior chest Mouth: reveals a few ulcerations on the tongue and oral mucosa  ECOG PERFORMANCE STATUS: 1 - Symptomatic but completely ambulatory  Blood pressure 141/58, pulse 77, temperature 97.9 F (36.6 C), temperature source Oral, resp. rate 20, height 5\' 1"  (1.549 m), weight 135 lb 14.4 oz (61.644 kg).  LABORATORY DATA: Lab Results  Component Value Date   WBC 5.9 07/27/2012   HGB 9.9* 07/27/2012   HCT 31.6* 07/27/2012   MCV  107.1* 07/27/2012   PLT 219 07/27/2012      Chemistry      Component Value Date/Time   NA 142 07/27/2012 1147   NA 137 03/14/2012 1617   K 3.3* 07/27/2012 1147   K 3.8 03/14/2012 1617   CL 108* 07/27/2012 1147   CL 98 03/14/2012 1617   CO2 22 07/27/2012 1147   CO2 24 03/14/2012 1617   BUN 22.6 07/27/2012 1147   BUN 38* 03/14/2012 1617   CREATININE 1.5* 07/27/2012 1147   CREATININE 1.75* 03/14/2012 1617  Component Value Date/Time   CALCIUM 9.4 07/27/2012 1147   CALCIUM 9.4 03/14/2012 1617   ALKPHOS 123 07/27/2012 1147   ALKPHOS 82 11/26/2011 1041   AST 56* 07/27/2012 1147   AST 25 11/26/2011 1041   ALT 25 07/27/2012 1147   ALT 15 11/26/2011 1041   BILITOT 0.86 07/27/2012 1147   BILITOT 0.3 11/26/2011 1041       RADIOGRAPHIC STUDIES: Ct Chest Wo Contrast  06/27/2012  *RADIOLOGY REPORT*  Clinical Data:  Lung cancer diagnosed 2013.  Chemotherapy ongoing  CT CHEST, ABDOMEN AND PELVIS WITHOUT CONTRAST  Technique:  Multidetector CT imaging of the chest, abdomen and pelvis was performed following the standard protocol without IV contrast.  Comparison:  CT 04/15/2012   CT CHEST  Findings:  No axillary lymphadenopathy.  Right supraclavicular lymph node measuring 17 mm short axis (image #6) is new from prior. In the mediastinum, interval enlargement of right lower paratracheal lymph node measuring 25 mm short axis increased from 10 mm on prior.  Subcarinal o/right peribronchial lymph node measures 25 mm short axis compared to 15 mm on prior.  The dominant mass lesion in the right middle lobe is increased in size measuring 44 mm x 37 mm compared to 33 x 21 mm on prior. The dominant mass does have a nodular cavitary component which is also increased compared to prior (image 31). Second nodule in the right middle lobe measuring 19 mm by 16 mm is increased slightly from17 mm x 15 mm on prior.  IMPRESSION:  1.  Progression / recurrence of lung cancer with increased in size of right middle lobe mass and nodule. 2.  New  mediastinal and right supraclavicular metastatic lymphadenopathy.   CT ABDOMEN AND PELVIS  Findings:  No focal hepatic lesion on this noncontrast exam.  The gallbladder, pancreas, spleen, adrenal glands, and kidneys are unchanged.  There is a large cyst extending from the right kidney which has some dependent debris and is  slightly decreased in size compared to prior measuring 41 mm.  The stomach, small bowel, and colon are normal.  Abdominal aorta normal caliber.  No retroperitoneal periportal lymphadenopathy.  No free fluid the pelvis. Post hysterectomy.  Uterus is and ovaries are normal.   No pelvic lymphadenopathy. Review of  bone windows demonstrates no aggressive osseous lesions.  IMPRESSION:  1.  No evidence of metastasis in the abdomen pelvis on noncontrast exam. 2.  Contraction of benign-appearing right renal cyst. 3.  Please see above chest section for progression of lung cancer.   Original Report Authenticated By: Genevive Bi, M.D.     ASSESSMENT/PLAN: this is a very pleasant 79 years old white female with metastatic non-small cell lung cancer, adenocarcinoma with negative EGFR mutation, negative ALK gene translocation. She is status post 6 cycles of induction chemotherapy with carboplatin and Alimta followed by 3 cycles of maintenance Alimta but unfortunately has evidence for disease progression after the third cycle. The patient is feeling fine today except for fatigue. The Veristrat test result came back as Veristrat good indicating the patient would have a favorable response to a tyrosine kinase inhibitor such as Tarceva. Patient was discussed with Dr. Arbutus Ped. She is now status post 2 weeks of Tarceva at 150 mg by mouth daily. She has had a few episodes of diarrhea, controlled with Imodium and a grade 1-2 rash and mild mucositis. A prescription for magic mouthwash and a refill for her Zofran was sent to her pharmacy of record via [facility].She will come back  in approximately 2 weeks for  another symptom management visit with a repeat CBC differential and C. met.   Laural Benes, Jamie-Lee Galdamez E, PA-C   All questions were answered. The patient knows to call the clinic with any problems, questions or concerns. We can certainly see the patient much sooner if necessary.  I spent 20 minutes counseling the patient face to face. The total time spent in the appointment was 30 minutes.

## 2012-08-03 ENCOUNTER — Telehealth: Payer: Self-pay | Admitting: Medical Oncology

## 2012-08-03 NOTE — Telephone Encounter (Signed)
[facility] . [facility]

## 2012-08-04 ENCOUNTER — Telehealth: Payer: Self-pay | Admitting: *Deleted

## 2012-08-04 MED ORDER — HYDROCODONE-HOMATROPINE 5-1.5 MG/5ML PO SYRP: 5.0000 mL | ORAL_SOLUTION | Freq: Four times a day (QID) | ORAL | Status: DC | PRN

## 2012-08-04 NOTE — Telephone Encounter (Signed)
Pt's daughter [name] called stating that pt has had a nagging cough for the past 4 days.  She is having trouble sleeping because of it.  She states the cough is not productive.  Per Tiana Loft, okay to give hycodan cough syrup.  SLJ

## 2012-08-10 ENCOUNTER — Telehealth: Payer: Self-pay | Admitting: Internal Medicine

## 2012-08-10 ENCOUNTER — Other Ambulatory Visit: Payer: Medicare Other | Admitting: Lab

## 2012-08-10 ENCOUNTER — Other Ambulatory Visit (HOSPITAL_BASED_OUTPATIENT_CLINIC_OR_DEPARTMENT_OTHER): Payer: Medicare Other | Admitting: Lab

## 2012-08-10 ENCOUNTER — Ambulatory Visit (HOSPITAL_BASED_OUTPATIENT_CLINIC_OR_DEPARTMENT_OTHER): Payer: Medicare Other | Admitting: Physician Assistant

## 2012-08-10 ENCOUNTER — Ambulatory Visit (HOSPITAL_COMMUNITY)
Admission: RE | Admit: 2012-08-10 | Discharge: 2012-08-10 | Disposition: A | Discharge status: Home / Self Care | Payer: Medicare Other | Source: Ambulatory Visit | Attending: Internal Medicine | Admitting: Internal Medicine

## 2012-08-10 DIAGNOSIS — M7989 Other specified soft tissue disorders: Secondary | ICD-10-CM | POA: Insufficient documentation

## 2012-08-10 DIAGNOSIS — M79609 Pain in unspecified limb: Secondary | ICD-10-CM | POA: Insufficient documentation

## 2012-08-10 DIAGNOSIS — C3490 Malignant neoplasm of unspecified part of unspecified bronchus or lung: Secondary | ICD-10-CM

## 2012-08-10 LAB — CBC WITH DIFFERENTIAL/PLATELET
Basophils Absolute: 0 10*3/uL (ref 0.0–0.1)
Eosinophils Absolute: 0.3 10*3/uL (ref 0.0–0.5)
HGB: 9.9 g/dL — ABNORMAL LOW (ref 11.6–15.9)
MCV: 103.5 fL — ABNORMAL HIGH (ref 79.5–101.0)
MONO#: 0.5 10*3/uL (ref 0.1–0.9)
MONO%: 9.8 % (ref 0.0–14.0)
NEUT#: 3.3 10*3/uL (ref 1.5–6.5)
Platelets: 221 10*3/uL (ref 145–400)
RDW: 16.4 % — ABNORMAL HIGH (ref 11.2–14.5)

## 2012-08-10 LAB — COMPREHENSIVE METABOLIC PANEL (CC13)
Albumin: 2.8 g/dL — ABNORMAL LOW (ref 3.5–5.0)
Alkaline Phosphatase: 128 U/L (ref 40–150)
BUN: 16.8 mg/dL (ref 7.0–26.0)
CO2: 27 mEq/L (ref 22–29)
Calcium: 9 mg/dL (ref 8.4–10.4)
Glucose: 103 mg/dl — ABNORMAL HIGH (ref 70–99)
Potassium: 3.7 mEq/L (ref 3.5–5.1)

## 2012-08-10 NOTE — Progress Notes (Signed)
*  PRELIMINARY RESULTS* Vascular Ultrasound Lower extremity venous duplex has been completed.  Preliminary findings: Bilateral:  No evidence of DVT, superficial thrombosis, or Baker's Cyst.    Farrel Demark, RDMS, RVT  08/10/2012, 4:28 PM

## 2012-08-10 NOTE — Telephone Encounter (Signed)
gve the pt her may 2014 appt calendar along with the oral contrast. Pt aware she will get a call from rad to set up the ct scan appts.

## 2012-08-10 NOTE — Patient Instructions (Addendum)
Dear being sent for Doppler studies of both legs to check for possible clot as a reason for your lower permit swelling Continue taking Tarceva 150 mg by mouth daily as prescribed Followup with Dr. Arbutus Ped in one month with a restaging CT scan of your chest, abdomen and pelvis to reevaluate her disease Followup with the physician who follows your gallop regarding your bilateral wrist and finger pain and swelling Followup with your primary care physician regarding your cough in your decision to discontinue taking the Advair

## 2012-08-12 NOTE — Progress Notes (Signed)
[facility] [phone]   [phone]  OFFICE PROGRESS NOTE  Teresa Dubonnet, MD [address] [place] [address]  DIAGNOSIS: Metastatic non-small cell lung cancer, adenocarcinoma with negative EGFR mutation and negative ALK gene translocation diagnosed in August of 2013   PRIOR THERAPY:  1) Systemic chemotherapy with carboplatin for AUC of 5 and Alimta 500 mg/M2 every 3 weeks. The patient is status post 6 cycles.  2) Maintenance chemotherapy with Alimta at 500 mg/M2 every 3 weeks. Status post 3 cycles discontinued today secondary to disease progression.  CURRENT THERAPY: Tarceva 150 mg by mouth daily, status post 2 weeks of therapy. Therapy beginning 3/22/20114.  INTERVAL HISTORY: [name] 79 y.o. female returns to the clinic today for symptom management visit after starting Tarceva therapy approximately 4 weeks ago. She is accompanied by her daughter [name] today. She states that she has gotten better results by using Aquaphor for her skin rash. His skin rash has calmed down. She called our office because of a cough and Hycodan cough syrup was called in for her. She's not found this to be significantly helpful in resolving her cough. She is wondering if this is related to some seasonal allergies. She also complains of both wrists and fingers being swollen and hurting. She is concerned that this may be a gout flare and plans to followup with the physician that follows her for her gout for further evaluation and management. She's also noted some lower extremity swelling more so on the left than on the right since coming back from the beach approximately week and a half ago. She denies any specific trauma to the lower extremities. She continues to have some intermittent episodes of diarrhea. She now has a grade 1 related to the Tarceva therapy.  She otherwise is tolerating the Tarceva relatively well. She denied having any significant  chest pain, shortness breath, cough or hemoptysis. She denied having any fever or chills.   MEDICAL HISTORY: Past Medical History  Diagnosis Date  . Cervical spondylosis   . Renal cyst     simple bilateral 2008  . Diverticulosis   . Vertigo, benign positional   . Hyperlipidemia   . Aortic sclerosis     by exam,  . Erosive osteoarthritis of hand   . History of calcium pyrophosphate deposition disease (CPPD)   . Gout     vs pseudo gout flares in past  . Mediastinal adenopathy   . Tobacco use   . COPD, mild   . Glaucoma   . Hypertension   . Osteopenia   . Hiatal hernia   . Hemorrhoids   . H/O urinary incontinence   . Childhood asthma   . Bronchitis   . TIA (transient ischemic attack)   . Claustrophobia   . Dizziness   . Bleeding from the nose     hx of severe nose bleeds-last time was 2 yrs ago, slight bleed last week  . lung ca 11/2011    ALLERGIES:  is allergic to shellfish allergy; septra ds; statins; tape; and wellbutrin.  MEDICATIONS:  Current Outpatient Prescriptions  Medication Sig Dispense Refill  . ADVAIR DISKUS 250-50 MCG/DOSE AEPB       . ALPRAZolam (XANAX) 0.25 MG tablet       . Alum & Mag Hydroxide-Simeth (MAGIC MOUTHWASH) SOLN Take 5 mLs by mouth 4 (four) times daily as needed.  160 mL  0  . aspirin 81 MG tablet Take  81 mg by mouth daily.      . B Complex-C (B-COMPLEX WITH VITAMIN C) tablet Take 1 tablet by mouth daily.      . Cholecalciferol (VITAMIN D) 2000 UNITS tablet Take 2,000 Units by mouth daily.      . diclofenac sodium (VOLTAREN) 1 % GEL Apply 1 application topically 4 (four) times daily.       Marland Kitchen diltiazem 2 % GEL Apply 1 application topically 4 (four) times daily.       Marland Kitchen erlotinib (TARCEVA) 150 MG tablet Take 1 tablet (150 mg total) by mouth daily. Take on an empty stomach 1 hour before meals or 2 hours after.  30 tablet  2  . estrogens, conjugated, (PREMARIN) 0.625 MG tablet Take 0.625 mg by mouth daily. Patient takes daily      . fish  oil-omega-3 fatty acids 1000 MG capsule Take 1 g by mouth 2 (two) times daily.       Marland Kitchen HYDROcodone-homatropine (HYCODAN) 5-1.5 MG/5ML syrup Take 5 mLs by mouth every 6 (six) hours as needed for cough.  240 mL  0  . ipratropium (ATROVENT) 0.06 % nasal spray Place 2 sprays into the nose 4 (four) times daily as needed.       Marland Kitchen LORazepam (ATIVAN) 0.5 MG tablet TAKE 1 TABLET B Y MOUTH OR DISSOLVE 1 TABLET UNDER THE TONGUE EVERY 8 HOURS AS NEEDED FOR NAUSEA  30 tablet  0  . Multiple Vitamin (MULTIVITAMIN WITH MINERALS) TABS Take 1 tablet by mouth daily.      . nebivolol (BYSTOLIC) 5 MG tablet Take 5 mg by mouth daily with supper.       . ondansetron (ZOFRAN-ODT) 8 MG disintegrating tablet TAKE 1 TABLET UNDER TONGUE EVERY 6 TO 8 HOURS AS NEEDED FOR NAUSEA/VOMITING  30 tablet  1  . prochlorperazine (COMPAZINE) 10 MG tablet Take 1 tablet (10 mg total) by mouth every 6 (six) hours as needed.  60 tablet  0  . timolol (TIMOPTIC-XR) 0.5 % ophthalmic gel-forming Place 1 drop into the left eye 2 (two) times daily.        No current facility-administered medications for this visit.    SURGICAL HISTORY:  Past Surgical History  Procedure Laterality Date  . Cystoscopy, transurethral collagen injection therapy    . Left total knee arthroplasty    . Appendectomy    . Hemorrhoid surgery    . Cataract surgery x 2    . Eye surgery    . Tonsillectomy    . Abdominal hysterectomy    . Colonoscopy      REVIEW OF SYSTEMS:  A comprehensive review of systems was negative except for: Gastrointestinal: positive for diarrhea acneform skin rash   PHYSICAL EXAMINATION: General appearance: alert, cooperative, fatigued and no distress Head: Normocephalic, without obvious abnormality, atraumatic Neck: no adenopathy Resp: clear to auscultation bilaterally Cardio: regular rate and rhythm, S1, S2 normal, no murmur, click, rub or gallop GI: soft, non-tender; bowel sounds normal; no masses,  no organomegaly Extremities:  extremities normal, atraumatic, no cyanosis or edema Neurologic: Alert and oriented X 3, normal strength and tone. Normal symmetric reflexes. Normal coordination and gait Skin: erythematous acneform eruptions affecting the face- chin, nose and  cheeks  Mouth: clear  ECOG PERFORMANCE STATUS: 1 - Symptomatic but completely ambulatory  Blood pressure 145/69, pulse 77, temperature 98 F (36.7 C), temperature source Oral, resp. rate 20, height 5\' 1"  (1.549 m), weight 138 lb 4.8 oz (62.732 kg).  LABORATORY DATA: Lab  Results  Component Value Date   WBC 5.1 08/10/2012   HGB 9.9* 08/10/2012   HCT 29.5* 08/10/2012   MCV 103.5* 08/10/2012   PLT 221 08/10/2012      Chemistry      Component Value Date/Time   NA 142 08/10/2012 1351   NA 137 03/14/2012 1617   K 3.7 08/10/2012 1351   K 3.8 03/14/2012 1617   CL 106 08/10/2012 1351   CL 98 03/14/2012 1617   CO2 27 08/10/2012 1351   CO2 24 03/14/2012 1617   BUN 16.8 08/10/2012 1351   BUN 38* 03/14/2012 1617   CREATININE 1.4* 08/10/2012 1351   CREATININE 1.75* 03/14/2012 1617      Component Value Date/Time   CALCIUM 9.0 08/10/2012 1351   CALCIUM 9.4 03/14/2012 1617   ALKPHOS 128 08/10/2012 1351   ALKPHOS 82 11/26/2011 1041   AST 61* 08/10/2012 1351   AST 25 11/26/2011 1041   ALT 23 08/10/2012 1351   ALT 15 11/26/2011 1041   BILITOT 0.99 08/10/2012 1351   BILITOT 0.3 11/26/2011 1041       RADIOGRAPHIC STUDIES: Ct Chest Wo Contrast  06/27/2012  *RADIOLOGY REPORT*  Clinical Data:  Lung cancer diagnosed 2013.  Chemotherapy ongoing  CT CHEST, ABDOMEN AND PELVIS WITHOUT CONTRAST  Technique:  Multidetector CT imaging of the chest, abdomen and pelvis was performed following the standard protocol without IV contrast.  Comparison:  CT 04/15/2012   CT CHEST  Findings:  No axillary lymphadenopathy.  Right supraclavicular lymph node measuring 17 mm short axis (image #6) is new from prior. In the mediastinum, interval enlargement of right lower paratracheal lymph node  measuring 25 mm short axis increased from 10 mm on prior.  Subcarinal o/right peribronchial lymph node measures 25 mm short axis compared to 15 mm on prior.  The dominant mass lesion in the right middle lobe is increased in size measuring 44 mm x 37 mm compared to 33 x 21 mm on prior. The dominant mass does have a nodular cavitary component which is also increased compared to prior (image 31). Second nodule in the right middle lobe measuring 19 mm by 16 mm is increased slightly from17 mm x 15 mm on prior.  IMPRESSION:  1.  Progression / recurrence of lung cancer with increased in size of right middle lobe mass and nodule. 2.  New mediastinal and right supraclavicular metastatic lymphadenopathy.   CT ABDOMEN AND PELVIS  Findings:  No focal hepatic lesion on this noncontrast exam.  The gallbladder, pancreas, spleen, adrenal glands, and kidneys are unchanged.  There is a large cyst extending from the right kidney which has some dependent debris and is  slightly decreased in size compared to prior measuring 41 mm.  The stomach, small bowel, and colon are normal.  Abdominal aorta normal caliber.  No retroperitoneal periportal lymphadenopathy.  No free fluid the pelvis. Post hysterectomy.  Uterus is and ovaries are normal.   No pelvic lymphadenopathy. Review of  bone windows demonstrates no aggressive osseous lesions.  IMPRESSION:  1.  No evidence of metastasis in the abdomen pelvis on noncontrast exam. 2.  Contraction of benign-appearing right renal cyst. 3.  Please see above chest section for progression of lung cancer.   Original Report Authenticated By: Genevive Bi, M.D.     ASSESSMENT/PLAN: this is a very pleasant 79 years old white female with metastatic non-small cell lung cancer, adenocarcinoma with negative EGFR mutation, negative ALK gene translocation. She is status post 6 cycles of  induction chemotherapy with carboplatin and Alimta followed by 3 cycles of maintenance Alimta but unfortunately has  evidence for disease progression after the third cycle. The patient is feeling fine today except for fatigue. The Veristrat test result came back as Veristrat good indicating the patient would have a favorable response to a tyrosine kinase inhibitor such as Tarceva. Patient was discussed with Dr. Arbutus Ped. She is now status post 1 month of Tarceva at 150 mg by mouth daily. She has had a few episodes of diarrhea, controlled with Imodium and a grade 1-2 rash and mild mucositis. The mucositis has resolved and the skin rash is now at grade 1. She will continue taking Tarceva at 150 mg by mouth daily. The patient will followup with Dr. Arbutus Ped in one month with a repeat CBC differential, C. met and CT of the chest, abdomen and pelvis with contrast to reevaluate her disease.    Laural Benes, Shela Esses E, PA-C   All questions were answered. The patient knows to call the clinic with any problems, questions or concerns. We can certainly see the patient much sooner if necessary.  I spent 20 minutes counseling the patient face to face. The total time spent in the appointment was 30 minutes.

## 2012-08-16 ENCOUNTER — Telehealth: Payer: Self-pay | Admitting: Internal Medicine

## 2012-08-16 NOTE — Telephone Encounter (Signed)
s.w. pt daughter and r/s time of 5.21.14 appt for later time

## 2012-08-24 ENCOUNTER — Telehealth: Payer: Self-pay | Admitting: Internal Medicine

## 2012-08-24 NOTE — Telephone Encounter (Signed)
s.w. pt and advised on time change of 5.21.14 appt....pt ok and aware

## 2012-08-26 ENCOUNTER — Other Ambulatory Visit: Payer: Self-pay | Admitting: Physician Assistant

## 2012-08-26 ENCOUNTER — Telehealth: Payer: Self-pay | Admitting: Medical Oncology

## 2012-08-26 MED ORDER — CLINDAMYCIN PHOSPHATE 1 % EX SOLN: Freq: Two times a day (BID) | CUTANEOUS | Status: DC

## 2012-08-26 NOTE — Telephone Encounter (Signed)
Per Adrena she e-scribed cleocin solution and I notified pt.

## 2012-09-02 ENCOUNTER — Ambulatory Visit (HOSPITAL_COMMUNITY)
Admission: RE | Admit: 2012-09-02 | Discharge: 2012-09-02 | Disposition: A | Discharge status: Home / Self Care | Payer: Medicare Other | Source: Ambulatory Visit | Attending: Physician Assistant | Admitting: Physician Assistant

## 2012-09-02 ENCOUNTER — Other Ambulatory Visit: Payer: Self-pay | Admitting: Physician Assistant

## 2012-09-02 ENCOUNTER — Telehealth: Payer: Self-pay | Admitting: *Deleted

## 2012-09-02 DIAGNOSIS — Z90710 Acquired absence of both cervix and uterus: Secondary | ICD-10-CM | POA: Insufficient documentation

## 2012-09-02 DIAGNOSIS — Z9089 Acquired absence of other organs: Secondary | ICD-10-CM | POA: Insufficient documentation

## 2012-09-02 DIAGNOSIS — C3490 Malignant neoplasm of unspecified part of unspecified bronchus or lung: Secondary | ICD-10-CM | POA: Insufficient documentation

## 2012-09-02 DIAGNOSIS — C771 Secondary and unspecified malignant neoplasm of intrathoracic lymph nodes: Secondary | ICD-10-CM | POA: Insufficient documentation

## 2012-09-02 DIAGNOSIS — I517 Cardiomegaly: Secondary | ICD-10-CM | POA: Insufficient documentation

## 2012-09-02 DIAGNOSIS — N83209 Unspecified ovarian cyst, unspecified side: Secondary | ICD-10-CM | POA: Insufficient documentation

## 2012-09-02 DIAGNOSIS — I700 Atherosclerosis of aorta: Secondary | ICD-10-CM | POA: Insufficient documentation

## 2012-09-02 DIAGNOSIS — K5730 Diverticulosis of large intestine without perforation or abscess without bleeding: Secondary | ICD-10-CM | POA: Insufficient documentation

## 2012-09-02 DIAGNOSIS — M4310 Spondylolisthesis, site unspecified: Secondary | ICD-10-CM | POA: Insufficient documentation

## 2012-09-02 DIAGNOSIS — N281 Cyst of kidney, acquired: Secondary | ICD-10-CM | POA: Insufficient documentation

## 2012-09-02 DIAGNOSIS — E041 Nontoxic single thyroid nodule: Secondary | ICD-10-CM | POA: Insufficient documentation

## 2012-09-02 DIAGNOSIS — E278 Other specified disorders of adrenal gland: Secondary | ICD-10-CM | POA: Insufficient documentation

## 2012-09-02 DIAGNOSIS — Z79899 Other long term (current) drug therapy: Secondary | ICD-10-CM | POA: Insufficient documentation

## 2012-09-02 MED ORDER — IOHEXOL 300 MG/ML  SOLN
100.0000 mL | Freq: Once | INTRAMUSCULAR | Status: AC | PRN
  Administered 2012-09-02: 100 mL via INTRAVENOUS

## 2012-09-02 NOTE — Telephone Encounter (Signed)
Pt's daughter called stating that pt had a pedicure a couple weeks ago and they cut her toe.  They have noticed that it is not healing and it is slightly painful.  No swelling noted or redness at this point but they are worried it is on the verge of getting infected.  They have been cleaning the toe and putting neosporin on it.  Per Forde Radon, pt needs to see PCP.  SLJ

## 2012-09-07 ENCOUNTER — Encounter: Payer: Self-pay | Admitting: Internal Medicine

## 2012-09-07 ENCOUNTER — Ambulatory Visit (HOSPITAL_BASED_OUTPATIENT_CLINIC_OR_DEPARTMENT_OTHER): Payer: Medicare Other | Admitting: Internal Medicine

## 2012-09-07 ENCOUNTER — Other Ambulatory Visit (HOSPITAL_BASED_OUTPATIENT_CLINIC_OR_DEPARTMENT_OTHER): Payer: Medicare Other | Admitting: Lab

## 2012-09-07 ENCOUNTER — Other Ambulatory Visit: Payer: Medicare Other | Admitting: Lab

## 2012-09-07 ENCOUNTER — Ambulatory Visit: Payer: Medicare Other | Admitting: Internal Medicine

## 2012-09-07 ENCOUNTER — Telehealth: Payer: Self-pay | Admitting: *Deleted

## 2012-09-07 ENCOUNTER — Telehealth: Payer: Self-pay | Admitting: Internal Medicine

## 2012-09-07 DIAGNOSIS — C342 Malignant neoplasm of middle lobe, bronchus or lung: Secondary | ICD-10-CM

## 2012-09-07 LAB — CBC WITH DIFFERENTIAL/PLATELET
BASO%: 0.3 % (ref 0.0–2.0)
EOS%: 3.4 % (ref 0.0–7.0)
LYMPH%: 23.1 % (ref 14.0–49.7)
MCH: 34.3 pg — ABNORMAL HIGH (ref 25.1–34.0)
MCHC: 34.3 g/dL (ref 31.5–36.0)
MCV: 100.1 fL (ref 79.5–101.0)
MONO%: 11.4 % (ref 0.0–14.0)
Platelets: 271 10*3/uL (ref 145–400)
RBC: 3.18 10*6/uL — ABNORMAL LOW (ref 3.70–5.45)
RDW: 16.2 % — ABNORMAL HIGH (ref 11.2–14.5)

## 2012-09-07 LAB — COMPREHENSIVE METABOLIC PANEL (CC13)
AST: 59 U/L — ABNORMAL HIGH (ref 5–34)
Albumin: 2.8 g/dL — ABNORMAL LOW (ref 3.5–5.0)
BUN: 19.9 mg/dL (ref 7.0–26.0)
CO2: 24 mEq/L (ref 22–29)
Calcium: 9.3 mg/dL (ref 8.4–10.4)
Chloride: 108 mEq/L — ABNORMAL HIGH (ref 98–107)
Creatinine: 1.4 mg/dL — ABNORMAL HIGH (ref 0.6–1.1)
Glucose: 118 mg/dl — ABNORMAL HIGH (ref 70–99)
Potassium: 3.6 mEq/L (ref 3.5–5.1)

## 2012-09-07 MED ORDER — ONDANSETRON 8 MG PO TBDP: ORAL_TABLET | ORAL | Status: DC

## 2012-09-07 MED ORDER — DEXAMETHASONE 4 MG PO TABS: ORAL_TABLET | ORAL | Status: DC

## 2012-09-07 NOTE — Telephone Encounter (Signed)
Per staff message and POF I have scheduled appts.  JMW  

## 2012-09-07 NOTE — Telephone Encounter (Signed)
gv and printed appt sched and avs to pt....emailed MW to add tx....pt ok and awar

## 2012-09-07 NOTE — Progress Notes (Signed)
[facility] Telephone:(336) [phone]   Fax:([phone]  OFFICE PROGRESS NOTE  [name], MD [address] [place] [address]  DIAGNOSIS: Metastatic non-small cell lung cancer, adenocarcinoma with negative EGFR mutation and negative ALK gene translocation diagnosed in August of 2013   PRIOR THERAPY:  1) Systemic chemotherapy with carboplatin for AUC of 5 and Alimta 500 mg/M2 every 3 weeks. The patient is status post 6 cycles.  2) Maintenance chemotherapy with Alimta at 500 mg/M2 every 3 weeks. Status post 3 cycles discontinued today secondary to disease progression.  3) Tarceva 150 mg by mouth daily, status post 2 months of therapy. Therapy beginning 3/22/20114.  CURRENT THERAPY: Docetaxel 75 mg/M2 every 3 weeks with Neulasta support, first dose expected on 09/19/2012.   INTERVAL HISTORY: [name] 79 y.o. female returns to the clinic today for followup visit accompanied by her friend. The patient is feeling fine today with no specific complaints except for mild fatigue. She tolerated the last months of treatment with Tarceva fairly well except for mild skin rash and few episodes of diarrhea. She denied having any significant weight loss or night sweats. The patient continues to have mild dry cough. She has no significant chest pain or hemoptysis. She had repeat CT scan of the chest, abdomen and pelvis performed recently and she is here for evaluation and discussion of her scan results.  MEDICAL HISTORY: Past Medical History  Diagnosis Date  . Cervical spondylosis   . Renal cyst     simple bilateral 2008  . Diverticulosis   . Vertigo, benign positional   . Hyperlipidemia   . Aortic sclerosis     by exam,  . Erosive osteoarthritis of hand   . History of calcium pyrophosphate deposition disease (CPPD)   . Gout     vs pseudo gout flares in past  . Mediastinal adenopathy   . Tobacco use   . COPD, mild   . Glaucoma   .  Hypertension   . Osteopenia   . Hiatal hernia   . Hemorrhoids   . H/O urinary incontinence   . Childhood asthma   . Bronchitis   . TIA (transient ischemic attack)   . Claustrophobia   . Dizziness   . Bleeding from the nose     hx of severe nose bleeds-last time was 2 yrs ago, slight bleed last week  . lung ca 11/2011    ALLERGIES:  is allergic to shellfish allergy; septra ds; statins; tape; and wellbutrin.  MEDICATIONS:  Current Outpatient Prescriptions  Medication Sig Dispense Refill  . ADVAIR DISKUS 250-50 MCG/DOSE AEPB       . ALPRAZolam (XANAX) 0.25 MG tablet       . Alum & Mag Hydroxide-Simeth (MAGIC MOUTHWASH) SOLN Take 5 mLs by mouth 4 (four) times daily as needed.  160 mL  0  . aspirin 81 MG tablet Take 81 mg by mouth daily.      . B Complex-C (B-COMPLEX WITH VITAMIN C) tablet Take 1 tablet by mouth daily.      . Cholecalciferol (VITAMIN D) 2000 UNITS tablet Take 2,000 Units by mouth daily.      . clindamycin (CLEOCIN-T) 1 % external solution Apply topically 2 (two) times daily.  60 mL  0  . diclofenac sodium (VOLTAREN) 1 % GEL Apply 1 application topically 4 (four) times daily.       Marland Kitchen diltiazem 2 % GEL Apply 1 application topically 4 (  four) times daily.       Marland Kitchen erlotinib (TARCEVA) 150 MG tablet Take 1 tablet (150 mg total) by mouth daily. Take on an empty stomach 1 hour before meals or 2 hours after.  30 tablet  2  . estrogens, conjugated, (PREMARIN) 0.625 MG tablet Take 0.625 mg by mouth daily. Patient takes daily      . fish oil-omega-3 fatty acids 1000 MG capsule Take 1 g by mouth 2 (two) times daily.       Marland Kitchen HYDROcodone-homatropine (HYCODAN) 5-1.5 MG/5ML syrup Take 5 mLs by mouth every 6 (six) hours as needed for cough.  240 mL  0  . ipratropium (ATROVENT) 0.06 % nasal spray Place 2 sprays into the nose 4 (four) times daily as needed.       Marland Kitchen LORazepam (ATIVAN) 0.5 MG tablet TAKE 1 TABLET B Y MOUTH OR DISSOLVE 1 TABLET UNDER THE TONGUE EVERY 8 HOURS AS NEEDED FOR NAUSEA   30 tablet  0  . Multiple Vitamin (MULTIVITAMIN WITH MINERALS) TABS Take 1 tablet by mouth daily.      . nebivolol (BYSTOLIC) 5 MG tablet Take 5 mg by mouth daily with supper.       . ondansetron (ZOFRAN-ODT) 8 MG disintegrating tablet TAKE 1 TABLET UNDER TONGUE EVERY 6 TO 8 HOURS AS NEEDED FOR NAUSEA/VOMITING  30 tablet  1  . timolol (TIMOPTIC-XR) 0.5 % ophthalmic gel-forming Place 1 drop into the left eye 2 (two) times daily.       . cephALEXin (KEFLEX) 250 MG capsule Take 250 mg by mouth daily.       No current facility-administered medications for this visit.    SURGICAL HISTORY:  Past Surgical History  Procedure Laterality Date  . Cystoscopy, transurethral collagen injection therapy    . Left total knee arthroplasty    . Appendectomy    . Hemorrhoid surgery    . Cataract surgery x 2    . Eye surgery    . Tonsillectomy    . Abdominal hysterectomy    . Colonoscopy      REVIEW OF SYSTEMS:  A comprehensive review of systems was negative except for: Constitutional: positive for fatigue Respiratory: positive for cough and dyspnea on exertion Gastrointestinal: positive for diarrhea   PHYSICAL EXAMINATION: General appearance: alert, cooperative, fatigued and no distress Head: Normocephalic, without obvious abnormality, atraumatic Neck: no adenopathy Lymph nodes: Cervical, supraclavicular, and axillary nodes normal. Resp: wheezes bilaterally Cardio: regular rate and rhythm, S1, S2 normal, no murmur, click, rub or gallop GI: soft, non-tender; bowel sounds normal; no masses,  no organomegaly Extremities: extremities normal, atraumatic, no cyanosis or edema Neurologic: Alert and oriented X 3, normal strength and tone. Normal symmetric reflexes. Normal coordination and gait  ECOG PERFORMANCE STATUS: 1 - Symptomatic but completely ambulatory  Blood pressure 147/67, pulse 73, temperature 96.9 F (36.1 C), temperature source Oral, resp. rate 18, height 5\' 1"  (1.549 m), weight 128 lb 4.8  oz (58.196 kg).  LABORATORY DATA: Lab Results  Component Value Date   WBC 5.9 09/07/2012   HGB 10.9* 09/07/2012   HCT 31.9* 09/07/2012   MCV 100.1 09/07/2012   PLT 271 09/07/2012      Chemistry      Component Value Date/Time   NA 143 09/07/2012 1151   NA 137 03/14/2012 1617   K 3.6 09/07/2012 1151   K 3.8 03/14/2012 1617   CL 108* 09/07/2012 1151   CL 98 03/14/2012 1617   CO2 24 09/07/2012 1151  CO2 24 03/14/2012 1617   BUN 19.9 09/07/2012 1151   BUN 38* 03/14/2012 1617   CREATININE 1.4* 09/07/2012 1151   CREATININE 1.75* 03/14/2012 1617      Component Value Date/Time   CALCIUM 9.3 09/07/2012 1151   CALCIUM 9.4 03/14/2012 1617   ALKPHOS 140 09/07/2012 1151   ALKPHOS 82 11/26/2011 1041   AST 59* 09/07/2012 1151   AST 25 11/26/2011 1041   ALT 22 09/07/2012 1151   ALT 15 11/26/2011 1041   BILITOT 0.72 09/07/2012 1151   BILITOT 0.3 11/26/2011 1041       RADIOGRAPHIC STUDIES: Ct Chest W Contrast  09/02/2012   *RADIOLOGY REPORT*  Clinical Data:  Metastatic non-small cell lung cancer, chemotherapy ongoing, status post appendectomy and hysterectomy  CT CHEST, ABDOMEN AND PELVIS WITH CONTRAST  Technique:  Multidetector CT imaging of the chest, abdomen and pelvis was performed following the standard protocol during bolus administration of intravenous contrast.  Contrast: OMNIPAQUE IOHEXOL 300 MG/ML  SOLN  Comparison:  06/27/2012    CT CHEST  Findings:  Posterior right middle lobe mass measures 6.3 x 5.5 cm (series 5/image 33), previously 3.8 x 4.5 cm.  Additional right middle lobe nodule measures 1.7 x 1.7 cm (series 5/image 34), unchanged.  Mild paraseptal emphysematous changes. No pleural effusion or pneumothorax.  Progression of widespread thoracic nodal metastases, including: --1.6 cm short-axis left supraclavicular node (series 2/image 5), previously 7 mm --1.8 cm short-axis right supraclavicular node (series 2/image 7), previously 1.7 cm --3.2 cm short-axis right paratracheal node (series  2/image 18), previously 2.5 cm --1.5 cm short-axis prevascular node (series 2/image 19), previously 7 mm --2.4 cm short-axis subcarinal node (series 2/image 28), previously 2.1 cm  Enlarging left thyroid nodule, measuring approximately 1.7 x 1.0 cm (series 2/image 8), metastasis not excluded.  Mild cardiomegaly.  No pericardial effusion.  Mild atherosclerotic calcifications of the aortic arch.  Degenerative changes of the thoracic spine.  IMPRESSION: 6.3 cm right middle lobe mass, increased.  Additional stable 1.7 cm right middle lobe nodule.  Progression of widespread thoracic nodal metastases, as described above.  Enlarging left thyroid nodule, metastasis not excluded.  Consider thyroid ultrasound if clinically warranted.  CT ABDOMEN AND PELVIS  Findings:  Liver, spleen, pancreas, and left adrenal gland are within normal limits.  Mild nodular thickening of the right adrenal gland (series 2/images 56-57), measuring approximately 9 mm and hypermetabolic on prior PET, unchanged.  Gallbladder is unremarkable.  No intrahepatic or extrahepatic ductal dilatation.  Further decrease in size of the complex/hemorrhagic posterior interpolar right renal cyst, now measuring 2.0 x 2.9 cm, previously 3.6 x 4.1 cm. Additional 7 mm probable anterior interpolar right renal cyst (series 4/image 14).  Left kidney is unremarkable.  No hydronephrosis.  No evidence of bowel obstruction.  Colonic diverticulosis, without associated inflammatory changes.  Atherosclerotic calcifications of the abdominal aorta and branch vessels.  No abdominopelvic ascites.  No suspicious abdominopelvic lymphadenopathy.  Status post hysterectomy.  Stable 1.7 cm left ovarian cyst (series 2/image 93). Right ovary is unremarkable.  Bladder is within normal limits.  Degenerative changes of the lumbar spine.  Grade 1 anterolisthesis of L5 on S1, without associated pars defects.  IMPRESSION: 9 mm right adrenal nodule, hypermetabolic on prior PET and possibly  reflecting a metastasis, although unchanged.  No evidence of new/progressive metastatic disease in the abdomen/pelvis.  Stable 1.7 cm left ovarian cyst.  Given postmenopausal status, annual ultrasound follow-up is suggested.   Original Report Authenticated By: Charline Bills, M.D.  ASSESSMENT: This is a very pleasant 79 years old white female with metastatic non-small cell lung cancer, adenocarcinoma status post systemic chemotherapy with carboplatin and Alimta followed by maintenance Alimta followed by 2 months of treatment with Tarceva but unfortunately she continues to have evidence for disease progression.   PLAN: I discussed the scan results with the patient today. I recommended for her to discontinue treatment was Tarceva at this point. I discussed with the patient other treatment options including treatment with single agent docetaxel 75 mg/M2 with Neulasta support every 3 weeks. I discussed with the patient adverse effect of the chemotherapy including but not limited to alopecia, myelosuppression, nausea and vomiting, peripheral neuropathy, liver or renal dysfunction. She is expected to start the first cycle of this treatment on 09/19/2012. The patient is interested in proceeding with the treatment. I will call her pharmacy with prescription for Decadron 8 mg by mouth twice a day the day before, day of and day after the chemotherapy. For the dry cough, I will start the patient on Hycodan 5 ML by mouth every 6 hours as needed. She would come back for followup visit with the second cycle of her chemotherapy. She was advised to call immediately if she has any concerning symptoms in the interval.  All questions were answered. The patient knows to call the clinic with any problems, questions or concerns. We can certainly see the patient much sooner if necessary.  I spent 15 minutes counseling the patient face to face. The total time spent in the appointment was 25 minutes.

## 2012-09-07 NOTE — Patient Instructions (Signed)
Unfortunately your CT scan showed evidence for disease progression. Discontinue treatment with Tarceva We discussed treatment options including treatment with docetaxel every 3 weeks.

## 2012-09-09 ENCOUNTER — Other Ambulatory Visit: Payer: Self-pay | Admitting: Medical Oncology

## 2012-09-09 NOTE — Telephone Encounter (Signed)
Coughing every time she talks . She is taking the hycodan twice a day

## 2012-09-09 NOTE — Telephone Encounter (Signed)
Per Dr Arbutus Ped I left message and instructed pt to take cough syrup every 6 hours prn.

## 2012-09-19 ENCOUNTER — Ambulatory Visit (HOSPITAL_BASED_OUTPATIENT_CLINIC_OR_DEPARTMENT_OTHER): Payer: Medicare Other

## 2012-09-19 ENCOUNTER — Encounter: Payer: Self-pay | Admitting: Physician Assistant

## 2012-09-19 ENCOUNTER — Other Ambulatory Visit (HOSPITAL_BASED_OUTPATIENT_CLINIC_OR_DEPARTMENT_OTHER): Payer: Medicare Other | Admitting: Lab

## 2012-09-19 DIAGNOSIS — Z5111 Encounter for antineoplastic chemotherapy: Secondary | ICD-10-CM

## 2012-09-19 DIAGNOSIS — C342 Malignant neoplasm of middle lobe, bronchus or lung: Secondary | ICD-10-CM

## 2012-09-19 LAB — COMPREHENSIVE METABOLIC PANEL (CC13)
AST: 53 U/L — ABNORMAL HIGH (ref 5–34)
Albumin: 2.8 g/dL — ABNORMAL LOW (ref 3.5–5.0)
Alkaline Phosphatase: 129 U/L (ref 40–150)
Calcium: 9.1 mg/dL (ref 8.4–10.4)
Chloride: 106 mEq/L (ref 98–107)
Glucose: 130 mg/dl — ABNORMAL HIGH (ref 70–99)
Potassium: 4 mEq/L (ref 3.5–5.1)
Sodium: 142 mEq/L (ref 136–145)
Total Protein: 7.6 g/dL (ref 6.4–8.3)

## 2012-09-19 LAB — CBC WITH DIFFERENTIAL/PLATELET
BASO%: 0.1 % (ref 0.0–2.0)
Basophils Absolute: 0 10*3/uL (ref 0.0–0.1)
EOS%: 0 % (ref 0.0–7.0)
HCT: 30.2 % — ABNORMAL LOW (ref 34.8–46.6)
HGB: 9.6 g/dL — ABNORMAL LOW (ref 11.6–15.9)
MCH: 31.7 pg (ref 25.1–34.0)
MONO#: 0.4 10*3/uL (ref 0.1–0.9)
NEUT%: 85.2 % — ABNORMAL HIGH (ref 38.4–76.8)
RDW: 15.9 % — ABNORMAL HIGH (ref 11.2–14.5)
WBC: 10.1 10*3/uL (ref 3.9–10.3)
lymph#: 1.1 10*3/uL (ref 0.9–3.3)

## 2012-09-19 MED ORDER — ONDANSETRON 8 MG/50ML IVPB (CHCC)
8.0000 mg | Freq: Once | INTRAVENOUS | Status: AC
  Administered 2012-09-19: 8 mg via INTRAVENOUS

## 2012-09-19 MED ORDER — METHYLPREDNISOLONE SODIUM SUCC 125 MG IJ SOLR
125.0000 mg | Freq: Once | INTRAMUSCULAR | Status: AC | PRN
  Administered 2012-09-19: 125 mg via INTRAVENOUS

## 2012-09-19 MED ORDER — SODIUM CHLORIDE 0.9 % IV SOLN
75.0000 mg/m2 | Freq: Once | INTRAVENOUS | Status: AC
  Administered 2012-09-19: 120 mg via INTRAVENOUS
  Filled 2012-09-19: qty 12

## 2012-09-19 MED ORDER — SODIUM CHLORIDE 0.9 % IV SOLN
Freq: Once | INTRAVENOUS | Status: AC
  Administered 2012-09-19: 13:00:00 via INTRAVENOUS

## 2012-09-19 MED ORDER — DEXAMETHASONE SODIUM PHOSPHATE 10 MG/ML IJ SOLN
10.0000 mg | Freq: Once | INTRAMUSCULAR | Status: AC
  Administered 2012-09-19: 10 mg via INTRAVENOUS

## 2012-09-19 MED ORDER — DIPHENHYDRAMINE HCL 50 MG/ML IJ SOLN
25.0000 mg | Freq: Once | INTRAMUSCULAR | Status: AC | PRN
  Administered 2012-09-19: 25 mg via INTRAVENOUS

## 2012-09-19 NOTE — Progress Notes (Signed)
Called infusion area to evaluate patient to had a hyperemic flush and a shaking chill reaction to her first time infusion of Taxotere after her the second "rate bump up". Patient initially became very red in the face neck and aunt anterior chest. She was given Benadryl and Solu-Medrol as documented in the [facility]. She was also a bit hypoxic with O2 sat on room air initially at 84% corrected to 100% on 2 L of oxygen via nasal cannula. Lungs were entirely clear without wheezes rales or rhonchi cardiovascular exam is non-tachycardic without appreciable murmur. Patient was stable and able to speak in complete sentences and in no acute distress. Patient discussed with Dr. Darrold Span. Infusion was discontinued. Patient given the slow infusion of normal saline in monitored for an additional 45 minutes. See nurse's notes for further details. Reaction will be reviewed with Dr. Arbutus Ped for further determination for treatment.  Laural Benes, Maryfrances Portugal E, PA-C

## 2012-09-19 NOTE — Patient Instructions (Signed)
[facility] Discharge Instructions for Patients Receiving Chemotherapy  Today you received the following chemotherapy agents Taxotere.  To help prevent nausea and vomiting after your treatment, we encourage you to take your nausea medication as prescribed.   If you develop nausea and vomiting that is not controlled by your nausea medication, call the clinic. If it is after clinic hours your family physician or the after hours number for the clinic or go to the Emergency Department.   BELOW ARE SYMPTOMS THAT SHOULD BE REPORTED IMMEDIATELY:  *FEVER GREATER THAN 100.5 F  *CHILLS WITH OR WITHOUT FEVER  NAUSEA AND VOMITING THAT IS NOT CONTROLLED WITH YOUR NAUSEA MEDICATION  *UNUSUAL SHORTNESS OF BREATH  *UNUSUAL BRUISING OR BLEEDING  TENDERNESS IN MOUTH AND THROAT WITH OR WITHOUT PRESENCE OF ULCERS  *URINARY PROBLEMS  *BOWEL PROBLEMS  UNUSUAL RASH Items with * indicate a potential emergency and should be followed up as soon as possible.  One of the nurses will contact you 24 hours after your treatment. Please let the nurse know about any problems that you may have experienced. Feel free to call the clinic you have any questions or concerns. The clinic phone number is [phone].

## 2012-09-19 NOTE — Progress Notes (Signed)
1438 Pt states "I feel like the sun is shining on me"  Pt red in face, neck and chest.  Taxotere stopped; Benadryl 25mg  IV given;  BP 157/60 P-75.  Tiana Loft, PA notified ordered to continue to watch. 1449 Pt starts shivering, continues to states she feels "warm, sun is still shining; now I feel cold"  O2 Sat on RA 84%  Pt put on 2 liters Solu-Medrol 125 mg IV given.  O2 sat 100 on 2 liters Tiana Loft, [place] aware.    1505 Per Tiana Loft, PA hold Taxotere for today and observe/monitor for 45 minutes.

## 2012-09-20 ENCOUNTER — Ambulatory Visit: Payer: Medicare Other

## 2012-09-20 ENCOUNTER — Telehealth: Payer: Self-pay | Admitting: *Deleted

## 2012-09-20 NOTE — Telephone Encounter (Signed)
Per Dr Donnald Garre, pt needs f/u this Thursday at 1:30pm, pt is aware of appt

## 2012-09-22 ENCOUNTER — Other Ambulatory Visit: Payer: Self-pay | Admitting: Medical Oncology

## 2012-09-22 ENCOUNTER — Encounter: Payer: Self-pay | Admitting: Internal Medicine

## 2012-09-22 ENCOUNTER — Ambulatory Visit (HOSPITAL_BASED_OUTPATIENT_CLINIC_OR_DEPARTMENT_OTHER): Payer: Medicare Other | Admitting: Internal Medicine

## 2012-09-22 ENCOUNTER — Telehealth: Payer: Self-pay | Admitting: Internal Medicine

## 2012-09-22 DIAGNOSIS — C342 Malignant neoplasm of middle lobe, bronchus or lung: Secondary | ICD-10-CM

## 2012-09-22 NOTE — Progress Notes (Signed)
[facility] [phone]   [phone]  OFFICE PROGRESS NOTE  [name], MD [address] [place] [address]  DIAGNOSIS: Metastatic non-small cell lung cancer, adenocarcinoma with negative EGFR mutation and negative ALK gene translocation diagnosed in August of 2013   PRIOR THERAPY:  1) Systemic chemotherapy with carboplatin for AUC of 5 and Alimta 500 mg/M2 every 3 weeks. The patient is status post 6 cycles.  2) Maintenance chemotherapy with Alimta at 500 mg/M2 every 3 weeks. Status post 3 cycles discontinued today secondary to disease progression.  3) Tarceva 150 mg by mouth daily, status post 2 months of therapy. Therapy beginning 3/22/20114.    CURRENT THERAPY: Docetaxel 75 mg/M2 every 3 weeks with Neulasta support, first dose was given  on 09/19/2012, but the first dose was discontinued after a few minutes secondary to concern of hypersensitivity reaction.    INTERVAL HISTORY: [name] 79 y.o. female returns to the clinic today for follow up visit accompanied by several family members. The patient is feeling fine today with no specific complaints. She was started on treatment with docetaxel 75 mg/M2 on 09/19/2012 but unfortunately she has flushing of her face few minutes after starting the treatment and there was a concern about hypersensitivity reaction and the treatment was discontinued. The patient never had any chest pain or shortness of breath during this episode. I was out of the office and I'm not sure of what she has was just flushing secondary to the Decadron anti-emetic. She is here today for evaluation and discussion of her treatment options based on the last incident. She denied having any significant nausea or vomiting. The patient denied having any significant chest pain, shortness breath, cough or hemoptysis.  MEDICAL HISTORY: Past Medical History  Diagnosis Date  . Cervical spondylosis   . Renal cyst      simple bilateral 2008  . Diverticulosis   . Vertigo, benign positional   . Hyperlipidemia   . Aortic sclerosis     by exam,  . Erosive osteoarthritis of hand   . History of calcium pyrophosphate deposition disease (CPPD)   . Gout     vs pseudo gout flares in past  . Mediastinal adenopathy   . Tobacco use   . COPD, mild   . Glaucoma   . Hypertension   . Osteopenia   . Hiatal hernia   . Hemorrhoids   . H/O urinary incontinence   . Childhood asthma   . Bronchitis   . TIA (transient ischemic attack)   . Claustrophobia   . Dizziness   . Bleeding from the nose     hx of severe nose bleeds-last time was 2 yrs ago, slight bleed last week  . lung ca 11/2011    ALLERGIES:  is allergic to shellfish allergy; septra ds; statins; tape; taxotere; and wellbutrin.  MEDICATIONS:  Current Outpatient Prescriptions  Medication Sig Dispense Refill  . ADVAIR DISKUS 250-50 MCG/DOSE AEPB       . ALPRAZolam (XANAX) 0.25 MG tablet       . Alum & Mag Hydroxide-Simeth (MAGIC MOUTHWASH) SOLN Take 5 mLs by mouth 4 (four) times daily as needed.  160 mL  0  . aspirin 81 MG tablet Take 81 mg by mouth daily.      . B Complex-C (B-COMPLEX WITH VITAMIN C) tablet Take 1 tablet by mouth daily.      . Cholecalciferol (VITAMIN D) 2000 UNITS tablet Take  2,000 Units by mouth daily.      . diclofenac sodium (VOLTAREN) 1 % GEL Apply 1 application topically 4 (four) times daily.       Marland Kitchen diltiazem 2 % GEL Apply 1 application topically 4 (four) times daily.       Marland Kitchen estrogens, conjugated, (PREMARIN) 0.625 MG tablet Take 0.625 mg by mouth daily. Patient takes daily      . fish oil-omega-3 fatty acids 1000 MG capsule Take 1 g by mouth 2 (two) times daily.       Marland Kitchen HYDROcodone-homatropine (HYCODAN) 5-1.5 MG/5ML syrup Take 5 mLs by mouth every 6 (six) hours as needed for cough.  240 mL  0  . ipratropium (ATROVENT) 0.06 % nasal spray Place 2 sprays into the nose 4 (four) times daily as needed.       Marland Kitchen LORazepam (ATIVAN)  0.5 MG tablet TAKE 1 TABLET B Y MOUTH OR DISSOLVE 1 TABLET UNDER THE TONGUE EVERY 8 HOURS AS NEEDED FOR NAUSEA  30 tablet  0  . Multiple Vitamin (MULTIVITAMIN WITH MINERALS) TABS Take 1 tablet by mouth daily.      . nebivolol (BYSTOLIC) 5 MG tablet Take 5 mg by mouth daily with supper.       . dexamethasone (DECADRON) 4 MG tablet 2 tablets by mouth twice a day the day before, day of and day after the chemotherapy every 3 weeks.  40 tablet  1  . ondansetron (ZOFRAN-ODT) 8 MG disintegrating tablet TAKE 1 TABLET UNDER TONGUE EVERY 6 TO 8 HOURS AS NEEDED FOR NAUSEA/VOMITING  30 tablet  1  . timolol (TIMOPTIC-XR) 0.5 % ophthalmic gel-forming Place 1 drop into the left eye 2 (two) times daily.        No current facility-administered medications for this visit.    SURGICAL HISTORY:  Past Surgical History  Procedure Laterality Date  . Cystoscopy, transurethral collagen injection therapy    . Left total knee arthroplasty    . Appendectomy    . Hemorrhoid surgery    . Cataract surgery x 2    . Eye surgery    . Tonsillectomy    . Abdominal hysterectomy    . Colonoscopy      REVIEW OF SYSTEMS:  A comprehensive review of systems was negative except for: Constitutional: positive for fatigue   PHYSICAL EXAMINATION: General appearance: alert, cooperative, fatigued and no distress Head: Normocephalic, without obvious abnormality, atraumatic Neck: no adenopathy Lymph nodes: Cervical, supraclavicular, and axillary nodes normal. Resp: clear to auscultation bilaterally Cardio: regular rate and rhythm, S1, S2 normal, no murmur, click, rub or gallop GI: soft, non-tender; bowel sounds normal; no masses,  no organomegaly Extremities: extremities normal, atraumatic, no cyanosis or edema Neurologic: Alert and oriented X 3, normal strength and tone. Normal symmetric reflexes. Normal coordination and gait  ECOG PERFORMANCE STATUS: 1 - Symptomatic but completely ambulatory  Blood pressure 135/58, pulse 74,  temperature 97.6 F (36.4 C), temperature source Oral, resp. rate 18, height 5\' 1"  (1.549 m), weight 130 lb 1.6 oz (59.013 kg).  LABORATORY DATA: Lab Results  Component Value Date   WBC 10.1 09/19/2012   HGB 9.6* 09/19/2012   HCT 30.2* 09/19/2012   MCV 99.7 09/19/2012   PLT 288 09/19/2012      Chemistry      Component Value Date/Time   NA 142 09/19/2012 1209   NA 137 03/14/2012 1617   K 4.0 09/19/2012 1209   K 3.8 03/14/2012 1617   CL 106 09/19/2012 1209  CL 98 03/14/2012 1617   CO2 26 09/19/2012 1209   CO2 24 03/14/2012 1617   BUN 20.8 09/19/2012 1209   BUN 38* 03/14/2012 1617   CREATININE 1.1 09/19/2012 1209   CREATININE 1.75* 03/14/2012 1617      Component Value Date/Time   CALCIUM 9.1 09/19/2012 1209   CALCIUM 9.4 03/14/2012 1617   ALKPHOS 129 09/19/2012 1209   ALKPHOS 82 11/26/2011 1041   AST 53* 09/19/2012 1209   AST 25 11/26/2011 1041   ALT 22 09/19/2012 1209   ALT 15 11/26/2011 1041   BILITOT 0.31 09/19/2012 1209   BILITOT 0.3 11/26/2011 1041       RADIOGRAPHIC STUDIES: Ct Chest W Contrast  09/02/2012   *RADIOLOGY REPORT*  Clinical Data:  Metastatic non-small cell lung cancer, chemotherapy ongoing, status post appendectomy and hysterectomy  CT CHEST, ABDOMEN AND PELVIS WITH CONTRAST  Technique:  Multidetector CT imaging of the chest, abdomen and pelvis was performed following the standard protocol during bolus administration of intravenous contrast.  Contrast: OMNIPAQUE IOHEXOL 300 MG/ML  SOLN  Comparison:  06/27/2012  CT CHEST  Findings:  Posterior right middle lobe mass measures 6.3 x 5.5 cm (series 5/image 33), previously 3.8 x 4.5 cm.  Additional right middle lobe nodule measures 1.7 x 1.7 cm (series 5/image 34), unchanged.  Mild paraseptal emphysematous changes. No pleural effusion or pneumothorax.  Progression of widespread thoracic nodal metastases, including: --1.6 cm short-axis left supraclavicular node (series 2/image 5), previously 7 mm --1.8 cm short-axis right supraclavicular node  (series 2/image 7), previously 1.7 cm --3.2 cm short-axis right paratracheal node (series 2/image 18), previously 2.5 cm --1.5 cm short-axis prevascular node (series 2/image 19), previously 7 mm --2.4 cm short-axis subcarinal node (series 2/image 28), previously 2.1 cm  Enlarging left thyroid nodule, measuring approximately 1.7 x 1.0 cm (series 2/image 8), metastasis not excluded.  Mild cardiomegaly.  No pericardial effusion.  Mild atherosclerotic calcifications of the aortic arch.  Degenerative changes of the thoracic spine.  IMPRESSION: 6.3 cm right middle lobe mass, increased.  Additional stable 1.7 cm right middle lobe nodule.  Progression of widespread thoracic nodal metastases, as described above.  Enlarging left thyroid nodule, metastasis not excluded.  Consider thyroid ultrasound if clinically warranted.  CT ABDOMEN AND PELVIS  Findings:  Liver, spleen, pancreas, and left adrenal gland are within normal limits.  Mild nodular thickening of the right adrenal gland (series 2/images 56-57), measuring approximately 9 mm and hypermetabolic on prior PET, unchanged.  Gallbladder is unremarkable.  No intrahepatic or extrahepatic ductal dilatation.  Further decrease in size of the complex/hemorrhagic posterior interpolar right renal cyst, now measuring 2.0 x 2.9 cm, previously 3.6 x 4.1 cm. Additional 7 mm probable anterior interpolar right renal cyst (series 4/image 14).  Left kidney is unremarkable.  No hydronephrosis.  No evidence of bowel obstruction.  Colonic diverticulosis, without associated inflammatory changes.  Atherosclerotic calcifications of the abdominal aorta and branch vessels.  No abdominopelvic ascites.  No suspicious abdominopelvic lymphadenopathy.  Status post hysterectomy.  Stable 1.7 cm left ovarian cyst (series 2/image 93). Right ovary is unremarkable.  Bladder is within normal limits.  Degenerative changes of the lumbar spine.  Grade 1 anterolisthesis of L5 on S1, without associated pars  defects.  IMPRESSION: 9 mm right adrenal nodule, hypermetabolic on prior PET and possibly reflecting a metastasis, although unchanged.  No evidence of new/progressive metastatic disease in the abdomen/pelvis.  Stable 1.7 cm left ovarian cyst.  Given postmenopausal status, annual ultrasound follow-up is  suggested.   Original Report Authenticated By: Charline Bills, M.D.   Ct Abdomen Pelvis W Contrast  09/02/2012   *RADIOLOGY REPORT*  Clinical Data:  Metastatic non-small cell lung cancer, chemotherapy ongoing, status post appendectomy and hysterectomy  CT CHEST, ABDOMEN AND PELVIS WITH CONTRAST  Technique:  Multidetector CT imaging of the chest, abdomen and pelvis was performed following the standard protocol during bolus administration of intravenous contrast.  Contrast: OMNIPAQUE IOHEXOL 300 MG/ML  SOLN  Comparison:  06/27/2012  CT CHEST  Findings:  Posterior right middle lobe mass measures 6.3 x 5.5 cm (series 5/image 33), previously 3.8 x 4.5 cm.  Additional right middle lobe nodule measures 1.7 x 1.7 cm (series 5/image 34), unchanged.  Mild paraseptal emphysematous changes. No pleural effusion or pneumothorax.  Progression of widespread thoracic nodal metastases, including: --1.6 cm short-axis left supraclavicular node (series 2/image 5), previously 7 mm --1.8 cm short-axis right supraclavicular node (series 2/image 7), previously 1.7 cm --3.2 cm short-axis right paratracheal node (series 2/image 18), previously 2.5 cm --1.5 cm short-axis prevascular node (series 2/image 19), previously 7 mm --2.4 cm short-axis subcarinal node (series 2/image 28), previously 2.1 cm  Enlarging left thyroid nodule, measuring approximately 1.7 x 1.0 cm (series 2/image 8), metastasis not excluded.  Mild cardiomegaly.  No pericardial effusion.  Mild atherosclerotic calcifications of the aortic arch.  Degenerative changes of the thoracic spine.  IMPRESSION: 6.3 cm right middle lobe mass, increased.  Additional stable 1.7 cm  right middle lobe nodule.  Progression of widespread thoracic nodal metastases, as described above.  Enlarging left thyroid nodule, metastasis not excluded.  Consider thyroid ultrasound if clinically warranted.  CT ABDOMEN AND PELVIS  Findings:  Liver, spleen, pancreas, and left adrenal gland are within normal limits.  Mild nodular thickening of the right adrenal gland (series 2/images 56-57), measuring approximately 9 mm and hypermetabolic on prior PET, unchanged.  Gallbladder is unremarkable.  No intrahepatic or extrahepatic ductal dilatation.  Further decrease in size of the complex/hemorrhagic posterior interpolar right renal cyst, now measuring 2.0 x 2.9 cm, previously 3.6 x 4.1 cm. Additional 7 mm probable anterior interpolar right renal cyst (series 4/image 14).  Left kidney is unremarkable.  No hydronephrosis.  No evidence of bowel obstruction.  Colonic diverticulosis, without associated inflammatory changes.  Atherosclerotic calcifications of the abdominal aorta and branch vessels.  No abdominopelvic ascites.  No suspicious abdominopelvic lymphadenopathy.  Status post hysterectomy.  Stable 1.7 cm left ovarian cyst (series 2/image 93). Right ovary is unremarkable.  Bladder is within normal limits.  Degenerative changes of the lumbar spine.  Grade 1 anterolisthesis of L5 on S1, without associated pars defects.  IMPRESSION: 9 mm right adrenal nodule, hypermetabolic on prior PET and possibly reflecting a metastasis, although unchanged.  No evidence of new/progressive metastatic disease in the abdomen/pelvis.  Stable 1.7 cm left ovarian cyst.  Given postmenopausal status, annual ultrasound follow-up is suggested.   Original Report Authenticated By: Charline Bills, M.D.    ASSESSMENT: this is a very pleasant 79 years old white female with metastatic non-small cell lung cancer, adenocarcinoma status post several regimen of therapy including systemic chemotherapy with carboplatin and Alimta followed by  maintenance Alimta as well as 2 months treatment with Tarceva discontinued secondary to disease progression.   PLAN: I have a lengthy discussion with the patient and her family today about her current condition and treatment options. I gave him the option of resuming her treatment with docetaxel was close monitoring at the beginning of the infusion  to evaluate for any hypersensitivity reaction versus changing chemotherapy to other agents like gemcitabine or Navelbine. After discussion of the options in the adverse effects, the patient and her family would like to consider treatment with docetaxel again as he reported hypersensitivity reaction was not very clear. I also discussed her case with our pharmacy manager and he agreed that the reaction is not clear and we will consider re\re challenging her with treatment with docetaxel. I also discussed with the patient and her family consideration of enrollment in a clinical trial with Anti PD-1 once we open the trial at our cancer Center. The patient would come back for follow up visit in 3 weeks for reevaluation and management any adverse effect of her treatment. She was advised to call immediately if she has any concerning symptoms and interval.  All questions were answered. The patient knows to call the clinic with any problems, questions or concerns. We can certainly see the patient much sooner if necessary.  I spent 20 minutes counseling the patient face to face. The total time spent in the appointment was 30 minutes.

## 2012-09-23 NOTE — Patient Instructions (Signed)
We discussed treatment options today including resuming docetaxel. Follow up visit in 3 weeks

## 2012-09-26 ENCOUNTER — Other Ambulatory Visit (HOSPITAL_BASED_OUTPATIENT_CLINIC_OR_DEPARTMENT_OTHER): Payer: Medicare Other | Admitting: Lab

## 2012-09-26 ENCOUNTER — Encounter: Payer: Self-pay | Admitting: *Deleted

## 2012-09-26 ENCOUNTER — Other Ambulatory Visit: Payer: Medicare Other

## 2012-09-26 ENCOUNTER — Telehealth: Payer: Self-pay | Admitting: Medical Oncology

## 2012-09-26 ENCOUNTER — Ambulatory Visit (HOSPITAL_BASED_OUTPATIENT_CLINIC_OR_DEPARTMENT_OTHER): Payer: Medicare Other

## 2012-09-26 DIAGNOSIS — C342 Malignant neoplasm of middle lobe, bronchus or lung: Secondary | ICD-10-CM

## 2012-09-26 DIAGNOSIS — C778 Secondary and unspecified malignant neoplasm of lymph nodes of multiple regions: Secondary | ICD-10-CM

## 2012-09-26 DIAGNOSIS — Z5111 Encounter for antineoplastic chemotherapy: Secondary | ICD-10-CM

## 2012-09-26 LAB — COMPREHENSIVE METABOLIC PANEL (CC13)
ALT: 26 U/L (ref 0–55)
AST: 48 U/L — ABNORMAL HIGH (ref 5–34)
Albumin: 2.7 g/dL — ABNORMAL LOW (ref 3.5–5.0)
Calcium: 8.8 mg/dL (ref 8.4–10.4)
Chloride: 107 mEq/L (ref 98–107)
Potassium: 4.3 mEq/L (ref 3.5–5.1)
Total Protein: 7.2 g/dL (ref 6.4–8.3)

## 2012-09-26 LAB — CBC WITH DIFFERENTIAL/PLATELET
BASO%: 0.2 % (ref 0.0–2.0)
Eosinophils Absolute: 0 10*3/uL (ref 0.0–0.5)
HCT: 29.7 % — ABNORMAL LOW (ref 34.8–46.6)
MCHC: 31.3 g/dL — ABNORMAL LOW (ref 31.5–36.0)
MONO#: 0.3 10*3/uL (ref 0.1–0.9)
NEUT#: 5.4 10*3/uL (ref 1.5–6.5)
NEUT%: 81.2 % — ABNORMAL HIGH (ref 38.4–76.8)
RBC: 2.93 10*6/uL — ABNORMAL LOW (ref 3.70–5.45)
WBC: 6.6 10*3/uL (ref 3.9–10.3)
lymph#: 0.9 10*3/uL (ref 0.9–3.3)
nRBC: 0 % (ref 0–0)

## 2012-09-26 MED ORDER — ONDANSETRON 8 MG/50ML IVPB (CHCC)
8.0000 mg | Freq: Once | INTRAVENOUS | Status: AC
  Administered 2012-09-26: 8 mg via INTRAVENOUS

## 2012-09-26 MED ORDER — DEXAMETHASONE SODIUM PHOSPHATE 10 MG/ML IJ SOLN
10.0000 mg | Freq: Once | INTRAMUSCULAR | Status: AC
  Administered 2012-09-26: 10 mg via INTRAVENOUS

## 2012-09-26 MED ORDER — SODIUM CHLORIDE 0.9 % IV SOLN
Freq: Once | INTRAVENOUS | Status: AC
  Administered 2012-09-26: 14:00:00 via INTRAVENOUS

## 2012-09-26 MED ORDER — SODIUM CHLORIDE 0.9 % IV SOLN
75.0000 mg/m2 | Freq: Once | INTRAVENOUS | Status: AC
  Administered 2012-09-26: 120 mg via INTRAVENOUS
  Filled 2012-09-26: qty 12

## 2012-09-26 NOTE — Progress Notes (Signed)
Patient had no reaction to Taxotere infusion today. VSS throughout treatment.

## 2012-09-26 NOTE — Patient Instructions (Addendum)
[facility] Discharge Instructions for Patients Receiving Chemotherapy  Today you received the following chemotherapy agents: Taxotere  To help prevent nausea and vomiting after your treatment, we encourage you to take your nausea medication as prescribed.    If you develop nausea and vomiting that is not controlled by your nausea medication, call the clinic.   BELOW ARE SYMPTOMS THAT SHOULD BE REPORTED IMMEDIATELY:  *FEVER GREATER THAN 100.5 F  *CHILLS WITH OR WITHOUT FEVER  NAUSEA AND VOMITING THAT IS NOT CONTROLLED WITH YOUR NAUSEA MEDICATION  *UNUSUAL SHORTNESS OF BREATH  *UNUSUAL BRUISING OR BLEEDING  TENDERNESS IN MOUTH AND THROAT WITH OR WITHOUT PRESENCE OF ULCERS  *URINARY PROBLEMS  *BOWEL PROBLEMS  UNUSUAL RASH Items with * indicate a potential emergency and should be followed up as soon as possible.  Feel free to call the clinic you have any questions or concerns. The clinic phone number is [phone].    

## 2012-09-26 NOTE — Telephone Encounter (Signed)
Pt called to tell me she forgot to take her steroid yesterday . She took her steroid this am .Per Dr Arbutus Ped have nurses moniter her very closely this infusion. Charge nurse notified.

## 2012-09-27 ENCOUNTER — Ambulatory Visit (HOSPITAL_BASED_OUTPATIENT_CLINIC_OR_DEPARTMENT_OTHER): Payer: Medicare Other

## 2012-09-27 DIAGNOSIS — C342 Malignant neoplasm of middle lobe, bronchus or lung: Secondary | ICD-10-CM

## 2012-09-27 MED ORDER — PEGFILGRASTIM INJECTION 6 MG/0.6ML
6.0000 mg | Freq: Once | SUBCUTANEOUS | Status: AC
  Administered 2012-09-27: 6 mg via SUBCUTANEOUS
  Filled 2012-09-27: qty 0.6

## 2012-09-27 NOTE — Patient Instructions (Addendum)

## 2012-09-30 ENCOUNTER — Other Ambulatory Visit: Payer: Self-pay | Admitting: Medical Oncology

## 2012-09-30 ENCOUNTER — Telehealth: Payer: Self-pay | Admitting: Internal Medicine

## 2012-09-30 ENCOUNTER — Telehealth: Payer: Self-pay | Admitting: Medical Oncology

## 2012-09-30 ENCOUNTER — Other Ambulatory Visit: Payer: Self-pay | Admitting: *Deleted

## 2012-09-30 NOTE — Telephone Encounter (Signed)
lvm for pt regarding to cancelation of all appt until 6.30.14....advised pt that i mailed avs and letter

## 2012-09-30 NOTE — Telephone Encounter (Signed)
[place] there on 6/16 and 6/23. I faxed orders to [facility], Pt notified of where to go to get labs- Note to Dr Arbutus Ped. Maureen Ralphs in research notified that pt will not be in town for labs on 6/16.

## 2012-10-03 ENCOUNTER — Other Ambulatory Visit: Payer: Medicare Other

## 2012-10-03 ENCOUNTER — Telehealth: Payer: Self-pay | Admitting: *Deleted

## 2012-10-03 NOTE — Telephone Encounter (Signed)
REFAXED  LAB ORDERS WITH THE DIAGNOSIS CODE OF 162.9 AND PRINTED [name]'S NAME.

## 2012-10-10 ENCOUNTER — Ambulatory Visit: Payer: Medicare Other | Admitting: Internal Medicine

## 2012-10-10 ENCOUNTER — Ambulatory Visit: Payer: Medicare Other

## 2012-10-10 ENCOUNTER — Other Ambulatory Visit: Payer: Medicare Other | Admitting: Lab

## 2012-10-11 ENCOUNTER — Ambulatory Visit: Payer: Medicare Other

## 2012-10-17 ENCOUNTER — Ambulatory Visit (HOSPITAL_BASED_OUTPATIENT_CLINIC_OR_DEPARTMENT_OTHER): Payer: Medicare Other

## 2012-10-17 ENCOUNTER — Other Ambulatory Visit: Payer: Medicare Other | Admitting: Lab

## 2012-10-17 ENCOUNTER — Ambulatory Visit (HOSPITAL_BASED_OUTPATIENT_CLINIC_OR_DEPARTMENT_OTHER): Payer: Medicare Other | Admitting: Internal Medicine

## 2012-10-17 ENCOUNTER — Encounter: Payer: Self-pay | Admitting: Internal Medicine

## 2012-10-17 ENCOUNTER — Other Ambulatory Visit: Payer: Medicare Other

## 2012-10-17 ENCOUNTER — Encounter: Payer: Self-pay | Admitting: *Deleted

## 2012-10-17 ENCOUNTER — Other Ambulatory Visit (HOSPITAL_BASED_OUTPATIENT_CLINIC_OR_DEPARTMENT_OTHER): Payer: Medicare Other | Admitting: Lab

## 2012-10-17 ENCOUNTER — Telehealth: Payer: Self-pay | Admitting: Internal Medicine

## 2012-10-17 DIAGNOSIS — C342 Malignant neoplasm of middle lobe, bronchus or lung: Secondary | ICD-10-CM

## 2012-10-17 DIAGNOSIS — Z5111 Encounter for antineoplastic chemotherapy: Secondary | ICD-10-CM

## 2012-10-17 LAB — CBC WITH DIFFERENTIAL/PLATELET
EOS%: 0 % (ref 0.0–7.0)
Eosinophils Absolute: 0 10*3/uL (ref 0.0–0.5)
HGB: 9.2 g/dL — ABNORMAL LOW (ref 11.6–15.9)
MCH: 32.9 pg (ref 25.1–34.0)
MCV: 97.1 fL (ref 79.5–101.0)
MONO%: 2.7 % (ref 0.0–14.0)
NEUT#: 12.9 10*3/uL — ABNORMAL HIGH (ref 1.5–6.5)
RBC: 2.79 10*6/uL — ABNORMAL LOW (ref 3.70–5.45)
RDW: 17.1 % — ABNORMAL HIGH (ref 11.2–14.5)
lymph#: 0.9 10*3/uL (ref 0.9–3.3)

## 2012-10-17 LAB — COMPREHENSIVE METABOLIC PANEL (CC13)
AST: 37 U/L — ABNORMAL HIGH (ref 5–34)
Albumin: 2.7 g/dL — ABNORMAL LOW (ref 3.5–5.0)
Alkaline Phosphatase: 171 U/L — ABNORMAL HIGH (ref 40–150)
Calcium: 9.9 mg/dL (ref 8.4–10.4)
Chloride: 108 mEq/L (ref 98–109)
Potassium: 4.5 mEq/L (ref 3.5–5.1)
Sodium: 141 mEq/L (ref 136–145)
Total Protein: 7.4 g/dL (ref 6.4–8.3)

## 2012-10-17 MED ORDER — ONDANSETRON 8 MG/50ML IVPB (CHCC)
8.0000 mg | Freq: Once | INTRAVENOUS | Status: AC
  Administered 2012-10-17: 8 mg via INTRAVENOUS

## 2012-10-17 MED ORDER — DEXAMETHASONE SODIUM PHOSPHATE 10 MG/ML IJ SOLN
10.0000 mg | Freq: Once | INTRAMUSCULAR | Status: AC
  Administered 2012-10-17: 10 mg via INTRAVENOUS

## 2012-10-17 MED ORDER — SODIUM CHLORIDE 0.9 % IV SOLN
75.0000 mg/m2 | Freq: Once | INTRAVENOUS | Status: AC
  Administered 2012-10-17: 120 mg via INTRAVENOUS
  Filled 2012-10-17: qty 12

## 2012-10-17 MED ORDER — SODIUM CHLORIDE 0.9 % IV SOLN
Freq: Once | INTRAVENOUS | Status: AC
  Administered 2012-10-17: 14:00:00 via INTRAVENOUS

## 2012-10-17 NOTE — Patient Instructions (Addendum)
Continue chemotherapy today as scheduled. Follow up visit in 3 weeks 

## 2012-10-17 NOTE — Patient Instructions (Addendum)
[facility] Discharge Instructions for Patients Receiving Chemotherapy  Today you received the following chemotherapy agents: Taxotere  To help prevent nausea and vomiting after your treatment, we encourage you to take your nausea medication as prescribed.    If you develop nausea and vomiting that is not controlled by your nausea medication, call the clinic.   BELOW ARE SYMPTOMS THAT SHOULD BE REPORTED IMMEDIATELY:  *FEVER GREATER THAN 100.5 F  *CHILLS WITH OR WITHOUT FEVER  NAUSEA AND VOMITING THAT IS NOT CONTROLLED WITH YOUR NAUSEA MEDICATION  *UNUSUAL SHORTNESS OF BREATH  *UNUSUAL BRUISING OR BLEEDING  TENDERNESS IN MOUTH AND THROAT WITH OR WITHOUT PRESENCE OF ULCERS  *URINARY PROBLEMS  *BOWEL PROBLEMS  UNUSUAL RASH Items with * indicate a potential emergency and should be followed up as soon as possible.  Feel free to call the clinic you have any questions or concerns. The clinic phone number is [phone].    

## 2012-10-17 NOTE — Telephone Encounter (Signed)
Pt will come by today after chemo to get appt calendar for July 2014

## 2012-10-17 NOTE — Progress Notes (Signed)
[facility] [phone]   [phone]  OFFICE PROGRESS NOTE  Teresa Dubonnet, MD [address] [place] [address]  DIAGNOSIS: Metastatic non-small cell lung cancer, adenocarcinoma with negative EGFR mutation and negative ALK gene translocation diagnosed in August of 2013   PRIOR THERAPY:  1) Systemic chemotherapy with carboplatin for AUC of 5 and Alimta 500 mg/M2 every 3 weeks. The patient is status post 6 cycles.  2) Maintenance chemotherapy with Alimta at 500 mg/M2 every 3 weeks. Status post 3 cycles discontinued today secondary to disease progression.  3) Tarceva 150 mg by mouth daily, status post 2 months of therapy. Therapy beginning 3/22/20114.   CURRENT THERAPY: Docetaxel 75 mg/M2 every 3 weeks with Neulasta support, first dose was given on 09/26/2012.  INTERVAL HISTORY: [name] 79 y.o. female returns to the clinic today for followup visit. The patient related the first cycle of her systemic chemotherapy with docetaxel fairly well with no significant adverse effects. She denied having any nausea or vomiting. She has no fever or chills. She has no significant chest pain, shortness breath, cough or hemoptysis.  MEDICAL HISTORY: Past Medical History  Diagnosis Date  . Cervical spondylosis   . Renal cyst     simple bilateral 2008  . Diverticulosis   . Vertigo, benign positional   . Hyperlipidemia   . Aortic sclerosis     by exam,  . Erosive osteoarthritis of hand   . History of calcium pyrophosphate deposition disease (CPPD)   . Gout     vs pseudo gout flares in past  . Mediastinal adenopathy   . Tobacco use   . COPD, mild   . Glaucoma   . Hypertension   . Osteopenia   . Hiatal hernia   . Hemorrhoids   . H/O urinary incontinence   . Childhood asthma   . Bronchitis   . TIA (transient ischemic attack)   . Claustrophobia   . Dizziness   . Bleeding from the nose     hx of severe nose bleeds-last time  was 2 yrs ago, slight bleed last week  . lung ca 11/2011    ALLERGIES:  is allergic to shellfish allergy; septra ds; statins; tape; taxotere; and wellbutrin.  MEDICATIONS:  Current Outpatient Prescriptions  Medication Sig Dispense Refill  . ADVAIR DISKUS 250-50 MCG/DOSE AEPB       . allopurinol (ZYLOPRIM) 100 MG tablet Take 100 mg by mouth daily.      Marland Kitchen ALPRAZolam (XANAX) 0.25 MG tablet       . Alum & Mag Hydroxide-Simeth (MAGIC MOUTHWASH) SOLN Take 5 mLs by mouth 4 (four) times daily as needed.  160 mL  0  . aspirin 81 MG tablet Take 81 mg by mouth daily.      . B Complex-C (B-COMPLEX WITH VITAMIN C) tablet Take 1 tablet by mouth daily.      . Cholecalciferol (VITAMIN D) 2000 UNITS tablet Take 2,000 Units by mouth daily.      . clindamycin (CLEOCIN T) 1 % external solution       . dexamethasone (DECADRON) 4 MG tablet 2 tablets by mouth twice a day the day before, day of and day after the chemotherapy every 3 weeks.  40 tablet  1  . diclofenac sodium (VOLTAREN) 1 % GEL Apply 1 application topically 4 (four) times daily.       Marland Kitchen diltiazem 2 % GEL Apply 1 application topically 4 (four)  times daily.       Marland Kitchen estrogens, conjugated, (PREMARIN) 0.625 MG tablet Take 0.625 mg by mouth daily. Patient takes daily      . fish oil-omega-3 fatty acids 1000 MG capsule Take 1 g by mouth 2 (two) times daily.       Marland Kitchen HYDROcodone-homatropine (HYCODAN) 5-1.5 MG/5ML syrup Take 5 mLs by mouth every 6 (six) hours as needed for cough.  240 mL  0  . ipratropium (ATROVENT) 0.06 % nasal spray Place 2 sprays into the nose 4 (four) times daily as needed.       Marland Kitchen LORazepam (ATIVAN) 0.5 MG tablet TAKE 1 TABLET B Y MOUTH OR DISSOLVE 1 TABLET UNDER THE TONGUE EVERY 8 HOURS AS NEEDED FOR NAUSEA  30 tablet  0  . Multiple Vitamin (MULTIVITAMIN WITH MINERALS) TABS Take 1 tablet by mouth daily.      . nebivolol (BYSTOLIC) 5 MG tablet Take 5 mg by mouth daily with supper.       . ondansetron (ZOFRAN-ODT) 8 MG disintegrating  tablet TAKE 1 TABLET UNDER TONGUE EVERY 6 TO 8 HOURS AS NEEDED FOR NAUSEA/VOMITING  30 tablet  1  . timolol (TIMOPTIC-XR) 0.5 % ophthalmic gel-forming Place 1 drop into the left eye 2 (two) times daily.        No current facility-administered medications for this visit.    SURGICAL HISTORY:  Past Surgical History  Procedure Laterality Date  . Cystoscopy, transurethral collagen injection therapy    . Left total knee arthroplasty    . Appendectomy    . Hemorrhoid surgery    . Cataract surgery x 2    . Eye surgery    . Tonsillectomy    . Abdominal hysterectomy    . Colonoscopy      REVIEW OF SYSTEMS:  A comprehensive review of systems was negative.   PHYSICAL EXAMINATION: General appearance: alert, cooperative and no distress Head: Normocephalic, without obvious abnormality, atraumatic Neck: no adenopathy Lymph nodes: Cervical, supraclavicular, and axillary nodes normal. Resp: clear to auscultation bilaterally Cardio: regular rate and rhythm, S1, S2 normal, no murmur, click, rub or gallop GI: soft, non-tender; bowel sounds normal; no masses,  no organomegaly Extremities: extremities normal, atraumatic, no cyanosis or edema  ECOG PERFORMANCE STATUS: 1 - Symptomatic but completely ambulatory  Blood pressure 153/72, pulse 82, temperature 97.2 F (36.2 C), temperature source Oral, resp. rate 18, height 5\' 1"  (1.549 m), weight 130 lb 1.6 oz (59.013 kg).  LABORATORY DATA: Lab Results  Component Value Date   WBC 14.1* 10/17/2012   HGB 9.2* 10/17/2012   HCT 27.1* 10/17/2012   MCV 97.1 10/17/2012   PLT 326 10/17/2012      Chemistry      Component Value Date/Time   NA 141 10/17/2012 1139   NA 137 03/14/2012 1617   K 4.5 10/17/2012 1139   K 3.8 03/14/2012 1617   CL 107 09/26/2012 1247   CL 98 03/14/2012 1617   CO2 24 10/17/2012 1139   CO2 24 03/14/2012 1617   BUN 27.4* 10/17/2012 1139   BUN 38* 03/14/2012 1617   CREATININE 1.0 10/17/2012 1139   CREATININE 1.75* 03/14/2012 1617        Component Value Date/Time   CALCIUM 9.9 10/17/2012 1139   CALCIUM 9.4 03/14/2012 1617   ALKPHOS 171* 10/17/2012 1139   ALKPHOS 82 11/26/2011 1041   AST 37* 10/17/2012 1139   AST 25 11/26/2011 1041   ALT 17 10/17/2012 1139   ALT 15 11/26/2011 1041  BILITOT 0.22 10/17/2012 1139   BILITOT 0.3 11/26/2011 1041       RADIOGRAPHIC STUDIES: No results found.  ASSESSMENT AND PLAN: This is a very pleasant 79 years old white female with metastatic non-small cell lung cancer, adenocarcinoma currently undergoing systemic chemotherapy with single agent docetaxel status post 1 cycle. She is tolerating her treatment fairly well with no significant adverse effects. I recommended for the patient to continue her chemotherapy with docetaxel today as scheduled. She would come back for followup visit in 3 weeks with the next cycle of her chemotherapy. She was advised to call immediately if she has any concerning symptoms in the interval.  All questions were answered. The patient knows to call the clinic with any problems, questions or concerns. We can certainly see the patient much sooner if necessary.

## 2012-10-17 NOTE — Progress Notes (Signed)
10/17/12 [name] is in the cancer center today for lab work, physical exam and chemotherapy. She completwed the PORs for BMS [id number] before her MD appointment. Also cliatfied her smoking history. She reports she does currently smoke any cigarretes. She did start smoking at age 79 and stopped 6 years ago. She smoked 1.5 packs/day.

## 2012-10-18 ENCOUNTER — Telehealth: Payer: Self-pay | Admitting: *Deleted

## 2012-10-18 ENCOUNTER — Telehealth: Payer: Self-pay | Admitting: Internal Medicine

## 2012-10-18 ENCOUNTER — Ambulatory Visit (HOSPITAL_BASED_OUTPATIENT_CLINIC_OR_DEPARTMENT_OTHER): Payer: Medicare Other

## 2012-10-18 DIAGNOSIS — C342 Malignant neoplasm of middle lobe, bronchus or lung: Secondary | ICD-10-CM

## 2012-10-18 MED ORDER — PEGFILGRASTIM INJECTION 6 MG/0.6ML
6.0000 mg | Freq: Once | SUBCUTANEOUS | Status: AC
  Administered 2012-10-18: 6 mg via SUBCUTANEOUS
  Filled 2012-10-18: qty 0.6

## 2012-10-18 NOTE — Telephone Encounter (Signed)
Called pt and left message regarding appts for July and August 2014

## 2012-10-18 NOTE — Telephone Encounter (Signed)
Per staff phone call and POF I have schedueld appts.  JMW  

## 2012-10-23 ENCOUNTER — Other Ambulatory Visit: Payer: Self-pay | Admitting: Internal Medicine

## 2012-10-24 ENCOUNTER — Telehealth: Payer: Self-pay | Admitting: Medical Oncology

## 2012-10-24 ENCOUNTER — Emergency Department (HOSPITAL_COMMUNITY)
Admission: EM | Admit: 2012-10-24 | Discharge: 2012-10-24 | Disposition: A | Discharge status: Home / Self Care | Payer: Medicare Other | Attending: Emergency Medicine | Admitting: Emergency Medicine

## 2012-10-24 ENCOUNTER — Emergency Department (HOSPITAL_COMMUNITY): Payer: Medicare Other

## 2012-10-24 ENCOUNTER — Other Ambulatory Visit (HOSPITAL_BASED_OUTPATIENT_CLINIC_OR_DEPARTMENT_OTHER): Payer: Medicare Other | Admitting: Lab

## 2012-10-24 ENCOUNTER — Encounter (HOSPITAL_COMMUNITY): Payer: Self-pay | Admitting: Emergency Medicine

## 2012-10-24 ENCOUNTER — Telehealth: Payer: Self-pay | Admitting: *Deleted

## 2012-10-24 ENCOUNTER — Other Ambulatory Visit: Payer: Self-pay | Admitting: Medical Oncology

## 2012-10-24 DIAGNOSIS — Z8669 Personal history of other diseases of the nervous system and sense organs: Secondary | ICD-10-CM | POA: Insufficient documentation

## 2012-10-24 DIAGNOSIS — Z8739 Personal history of other diseases of the musculoskeletal system and connective tissue: Secondary | ICD-10-CM | POA: Insufficient documentation

## 2012-10-24 DIAGNOSIS — Z8679 Personal history of other diseases of the circulatory system: Secondary | ICD-10-CM | POA: Insufficient documentation

## 2012-10-24 DIAGNOSIS — Z862 Personal history of diseases of the blood and blood-forming organs and certain disorders involving the immune mechanism: Secondary | ICD-10-CM | POA: Insufficient documentation

## 2012-10-24 DIAGNOSIS — R5381 Other malaise: Secondary | ICD-10-CM | POA: Insufficient documentation

## 2012-10-24 DIAGNOSIS — Z8673 Personal history of transient ischemic attack (TIA), and cerebral infarction without residual deficits: Secondary | ICD-10-CM | POA: Insufficient documentation

## 2012-10-24 DIAGNOSIS — Z87891 Personal history of nicotine dependence: Secondary | ICD-10-CM | POA: Insufficient documentation

## 2012-10-24 DIAGNOSIS — I10 Essential (primary) hypertension: Secondary | ICD-10-CM | POA: Insufficient documentation

## 2012-10-24 DIAGNOSIS — Z8639 Personal history of other endocrine, nutritional and metabolic disease: Secondary | ICD-10-CM | POA: Insufficient documentation

## 2012-10-24 DIAGNOSIS — M549 Dorsalgia, unspecified: Secondary | ICD-10-CM | POA: Insufficient documentation

## 2012-10-24 DIAGNOSIS — R109 Unspecified abdominal pain: Secondary | ICD-10-CM | POA: Insufficient documentation

## 2012-10-24 DIAGNOSIS — R42 Dizziness and giddiness: Secondary | ICD-10-CM | POA: Insufficient documentation

## 2012-10-24 DIAGNOSIS — Z7982 Long term (current) use of aspirin: Secondary | ICD-10-CM | POA: Insufficient documentation

## 2012-10-24 DIAGNOSIS — Z87448 Personal history of other diseases of urinary system: Secondary | ICD-10-CM | POA: Insufficient documentation

## 2012-10-24 DIAGNOSIS — J4489 Other specified chronic obstructive pulmonary disease: Secondary | ICD-10-CM | POA: Insufficient documentation

## 2012-10-24 DIAGNOSIS — N390 Urinary tract infection, site not specified: Secondary | ICD-10-CM

## 2012-10-24 DIAGNOSIS — J449 Chronic obstructive pulmonary disease, unspecified: Secondary | ICD-10-CM | POA: Insufficient documentation

## 2012-10-24 DIAGNOSIS — Z79899 Other long term (current) drug therapy: Secondary | ICD-10-CM | POA: Insufficient documentation

## 2012-10-24 DIAGNOSIS — R5383 Other fatigue: Secondary | ICD-10-CM | POA: Insufficient documentation

## 2012-10-24 DIAGNOSIS — C342 Malignant neoplasm of middle lobe, bronchus or lung: Secondary | ICD-10-CM

## 2012-10-24 DIAGNOSIS — C3490 Malignant neoplasm of unspecified part of unspecified bronchus or lung: Secondary | ICD-10-CM | POA: Insufficient documentation

## 2012-10-24 DIAGNOSIS — Z8719 Personal history of other diseases of the digestive system: Secondary | ICD-10-CM | POA: Insufficient documentation

## 2012-10-24 LAB — CBC WITH DIFFERENTIAL/PLATELET
Basophils Absolute: 0 10*3/uL (ref 0.0–0.1)
Basophils Relative: 1 % (ref 0–1)
EOS%: 0.1 % (ref 0.0–7.0)
Eosinophils Absolute: 0 10*3/uL (ref 0.0–0.7)
HCT: 26 % — ABNORMAL LOW (ref 34.8–46.6)
HCT: 25 % — ABNORMAL LOW (ref 36.0–46.0)
HGB: 8.8 g/dL — ABNORMAL LOW (ref 11.6–15.9)
Hemoglobin: 8.1 g/dL — ABNORMAL LOW (ref 12.0–15.0)
LYMPH%: 17.9 % (ref 14.0–49.7)
MCH: 33.2 pg (ref 25.1–34.0)
MCH: 32.1 pg (ref 26.0–34.0)
MCHC: 32.4 g/dL (ref 30.0–36.0)
MCV: 98.1 fL (ref 79.5–101.0)
Monocytes Absolute: 1.4 10*3/uL — ABNORMAL HIGH (ref 0.1–1.0)
NEUT%: 70.4 % (ref 38.4–76.8)
Neutro Abs: 7.2 10*3/uL (ref 1.7–7.7)
Platelets: 216 10*3/uL (ref 145–400)
lymph#: 1.4 10*3/uL (ref 0.9–3.3)

## 2012-10-24 LAB — COMPREHENSIVE METABOLIC PANEL (CC13)
AST: 52 U/L — ABNORMAL HIGH (ref 5–34)
BUN: 25.1 mg/dL (ref 7.0–26.0)
CO2: 26 mEq/L (ref 22–29)
Calcium: 9.1 mg/dL (ref 8.4–10.4)
Chloride: 106 mEq/L (ref 98–109)
Creatinine: 1.1 mg/dL (ref 0.6–1.1)
Total Bilirubin: 0.34 mg/dL (ref 0.20–1.20)

## 2012-10-24 LAB — COMPREHENSIVE METABOLIC PANEL
BUN: 25 mg/dL — ABNORMAL HIGH (ref 6–23)
CO2: 23 mEq/L (ref 19–32)
Chloride: 102 mEq/L (ref 96–112)
Creatinine, Ser: 1.17 mg/dL — ABNORMAL HIGH (ref 0.50–1.10)
GFR calc non Af Amer: 43 mL/min — ABNORMAL LOW (ref >90)
Total Bilirubin: 0.2 mg/dL — ABNORMAL LOW (ref 0.3–1.2)

## 2012-10-24 LAB — URINALYSIS, ROUTINE W REFLEX MICROSCOPIC
Glucose, UA: NEGATIVE mg/dL
Ketones, ur: NEGATIVE mg/dL
Leukocytes, UA: NEGATIVE
Nitrite: POSITIVE — AB
Protein, ur: NEGATIVE mg/dL

## 2012-10-24 LAB — LIPASE, BLOOD: Lipase: 39 U/L (ref 11–59)

## 2012-10-24 MED ORDER — CIPROFLOXACIN HCL 500 MG PO TABS: 500.0000 mg | ORAL_TABLET | Freq: Two times a day (BID) | ORAL | Status: DC

## 2012-10-24 MED ORDER — SODIUM CHLORIDE 0.9 % IV BOLUS (SEPSIS)
250.0000 mL | Freq: Once | INTRAVENOUS | Status: AC
  Administered 2012-10-24: 250 mL via INTRAVENOUS

## 2012-10-24 MED ORDER — LORAZEPAM 0.5 MG PO TABS: 0.5000 mg | ORAL_TABLET | Freq: Three times a day (TID) | ORAL | Status: DC | PRN

## 2012-10-24 MED ORDER — ONDANSETRON HCL 4 MG/2ML IJ SOLN
4.0000 mg | Freq: Once | INTRAMUSCULAR | Status: AC
  Administered 2012-10-24: 4 mg via INTRAVENOUS
  Filled 2012-10-24: qty 2

## 2012-10-24 MED ORDER — CIPROFLOXACIN HCL 500 MG PO TABS
500.0000 mg | ORAL_TABLET | Freq: Once | ORAL | Status: AC
  Administered 2012-10-24: 500 mg via ORAL
  Filled 2012-10-24: qty 1

## 2012-10-24 MED ORDER — SODIUM CHLORIDE 0.9 % IV SOLN: INTRAVENOUS | Status: DC

## 2012-10-24 MED ORDER — HYDROMORPHONE HCL PF 1 MG/ML IJ SOLN
0.5000 mg | Freq: Once | INTRAMUSCULAR | Status: AC
  Administered 2012-10-24: 0.5 mg via INTRAVENOUS
  Filled 2012-10-24: qty 1

## 2012-10-24 NOTE — Telephone Encounter (Signed)
Reports lower abdominal pain sharp and intermittent since Saturday 7/5. It radiates to her lower back. She reports she is having normal BM except sat she noted blood in the stool. She denies fever. I instructed pt to go to [facility] and she said she would go. Pt had chemo 1 week ago.

## 2012-10-24 NOTE — ED Provider Notes (Signed)
History    CSN: [id number] Arrival date & time 10/24/12  1734  First MD Initiated Contact with Patient 10/24/12 1825     Chief Complaint  Patient presents with  . Nausea  . Emesis  . Weakness  . balance off    (Consider location/radiation/quality/duration/timing/severity/associated sxs/prior Treatment) Patient is a 79 y.o. female presenting with vomiting and weakness. The history is provided by the patient.  Emesis Associated symptoms: abdominal pain   Associated symptoms: no diarrhea and no headaches   Weakness Associated symptoms include abdominal pain. Pertinent negatives include no chest pain, no headaches and no shortness of breath.   patient followed by hematology oncology getting for lung cancer. Patient followed by Gwenyth Bouillon. Undergoing chemotherapy currently. Patient presents today with a complaint of bilateral lower quadrant abdominal pain that radiates to the back. It is intermittent in nature. Associated with some nausea and vomiting she has vomited x2. No fever. Patient also complained of balance being off over the past one to 2 months. No recent head CT. Patient primary care doctors Dr. Kevan Ny. Patient describes abdominal pain is at 2-4/10 ache in nature. Denies dysuria hematuria. Denies fevers. Past Medical History  Diagnosis Date  . Cervical spondylosis   . Renal cyst     simple bilateral 2008  . Diverticulosis   . Vertigo, benign positional   . Hyperlipidemia   . Aortic sclerosis     by exam,  . Erosive osteoarthritis of hand   . History of calcium pyrophosphate deposition disease (CPPD)   . Gout     vs pseudo gout flares in past  . Mediastinal adenopathy   . Tobacco use   . COPD, mild   . Glaucoma   . Hypertension   . Osteopenia   . Hiatal hernia   . Hemorrhoids   . H/O urinary incontinence   . Childhood asthma   . Bronchitis   . TIA (transient ischemic attack)   . Claustrophobia   . Dizziness   . Bleeding from the nose     hx of severe nose  bleeds-last time was 2 yrs ago, slight bleed last week  . lung ca 11/2011   Past Surgical History  Procedure Laterality Date  . Cystoscopy, transurethral collagen injection therapy    . Left total knee arthroplasty    . Appendectomy    . Hemorrhoid surgery    . Cataract surgery x 2    . Eye surgery    . Tonsillectomy    . Abdominal hysterectomy    . Colonoscopy     Family History  Problem Relation Age of Onset  . Heart disease Father   . Cirrhosis Mother    History  Substance Use Topics  . Smoking status: Former Smoker -- 2.00 packs/day for 35 years    Types: Cigarettes    Quit date: 04/21/2003  . Smokeless tobacco: Never Used  . Alcohol Use: Yes     Comment: rare wine   OB History   Grav Para Term Preterm Abortions TAB SAB Ect Mult Living                 Review of Systems  Constitutional: Positive for fatigue. Negative for fever.  HENT: Negative for congestion.   Eyes: Negative for visual disturbance.  Respiratory: Negative for shortness of breath.   Cardiovascular: Negative for chest pain.  Gastrointestinal: Positive for nausea, vomiting and abdominal pain. Negative for diarrhea.  Genitourinary: Negative for dysuria.  Musculoskeletal: Positive for back pain.  Skin:  Negative for rash.  Neurological: Positive for dizziness, weakness and light-headedness. Negative for syncope and headaches.  Hematological: Does not bruise/bleed easily.  Psychiatric/Behavioral: Negative for confusion.    Allergies  Shellfish allergy; Septra ds; Statins; Tape; Taxotere; and Wellbutrin  Home Medications   Current Outpatient Rx  Name  Route  Sig  Dispense  Refill  . ADVAIR DISKUS 250-50 MCG/DOSE AEPB   Inhalation   Inhale 1 puff into the lungs daily as needed (shortness of breath).          Marland Kitchen allopurinol (ZYLOPRIM) 100 MG tablet   Oral   Take 100 mg by mouth every morning.          Marland Kitchen ALPRAZolam (XANAX) 0.25 MG tablet      0.25 mg 3 (three) times daily as needed  (nausea).          . Alum & Mag Hydroxide-Simeth (MAGIC MOUTHWASH) SOLN   Oral   Take 5 mLs by mouth 4 (four) times daily as needed (mouth sores).         Marland Kitchen aspirin 81 MG tablet   Oral   Take 81 mg by mouth every evening.          . B Complex-C (B-COMPLEX WITH VITAMIN C) tablet   Oral   Take 1 tablet by mouth every morning.          . Cholecalciferol (VITAMIN D) 2000 UNITS tablet   Oral   Take 2,000 Units by mouth every evening.          . diltiazem 2 % GEL   Topical   Apply 1 application topically 2 (two) times daily as needed (hemmorrhoids).          . estrogens, conjugated, (PREMARIN) 0.625 MG tablet   Oral   Take 0.625 mg by mouth every morning.          . fish oil-omega-3 fatty acids 1000 MG capsule   Oral   Take 1 g by mouth 2 (two) times daily.          Marland Kitchen ipratropium (ATROVENT) 0.06 % nasal spray   Nasal   Place 2 sprays into the nose 4 (four) times daily as needed.          Marland Kitchen LORazepam (ATIVAN) 0.5 MG tablet   Oral   Take 1 tablet (0.5 mg total) by mouth every 8 (eight) hours as needed for anxiety.   30 tablet   0     X   . Multiple Vitamin (MULTIVITAMIN WITH MINERALS) TABS   Oral   Take 1 tablet by mouth every morning.          . nebivolol (BYSTOLIC) 5 MG tablet   Oral   Take 5 mg by mouth every evening.          . ondansetron (ZOFRAN-ODT) 8 MG disintegrating tablet      TAKE 1 TABLET UNDER TONGUE EVERY 6 TO 8 HOURS AS NEEDED FOR NAUSEA/VOMITING   30 tablet   1   . timolol (TIMOPTIC-XR) 0.5 % ophthalmic gel-forming   Left Eye   Place 1 drop into the left eye 2 (two) times daily.          . ciprofloxacin (CIPRO) 500 MG tablet   Oral   Take 1 tablet (500 mg total) by mouth 2 (two) times daily.   14 tablet   0    BP 114/49  Pulse 85  Temp(Src) 97.9 F (36.6 C) (Oral)  Resp 20  SpO2 92% Physical Exam  Nursing note and vitals reviewed. Constitutional: She is oriented to person, place, and time. She appears  well-developed and well-nourished. No distress.  HENT:  Head: Normocephalic and atraumatic.  Mouth/Throat: Oropharynx is clear and moist.  Eyes: Conjunctivae and EOM are normal. Pupils are equal, round, and reactive to light.  Neck: Normal range of motion.  Cardiovascular: Normal rate, regular rhythm and normal heart sounds.   Pulmonary/Chest: Effort normal and breath sounds normal. No respiratory distress.  Abdominal: Soft. Bowel sounds are normal. There is no tenderness.  Musculoskeletal: Normal range of motion.  Neurological: She is alert and oriented to person, place, and time. No cranial nerve deficit. She exhibits normal muscle tone. Coordination normal.  Skin: Skin is warm. No rash noted.    ED Course  Procedures (including critical care time) Labs Reviewed  CBC WITH DIFFERENTIAL - Abnormal; Notable for the following:    RBC 2.52 (*)    Hemoglobin 8.1 (*)    HCT 25.0 (*)    RDW 16.2 (*)    Monocytes Relative 14 (*)    Monocytes Absolute 1.4 (*)    All other components within normal limits  COMPREHENSIVE METABOLIC PANEL - Abnormal; Notable for the following:    Glucose, Bld 119 (*)    BUN 25 (*)    Creatinine, Ser 1.17 (*)    Albumin 2.4 (*)    AST 46 (*)    Alkaline Phosphatase 155 (*)    Total Bilirubin 0.2 (*)    GFR calc non Af Amer 43 (*)    GFR calc Af Amer 49 (*)    All other components within normal limits  URINALYSIS, ROUTINE W REFLEX MICROSCOPIC - Abnormal; Notable for the following:    APPearance CLOUDY (*)    Nitrite POSITIVE (*)    All other components within normal limits  URINE MICROSCOPIC-ADD ON - Abnormal; Notable for the following:    Bacteria, UA MANY (*)    All other components within normal limits  URINE CULTURE  LIPASE, BLOOD   Results for orders placed during the hospital encounter of 10/24/12  CBC WITH DIFFERENTIAL      Result Value Range   WBC 10.3  4.0 - 10.5 K/uL   RBC 2.52 (*) 3.87 - 5.11 MIL/uL   Hemoglobin 8.1 (*) 12.0 - 15.0 g/dL    HCT 16.1 (*) 09.6 - 46.0 %   MCV 99.2  78.0 - 100.0 fL   MCH 32.1  26.0 - 34.0 pg   MCHC 32.4  30.0 - 36.0 g/dL   RDW 04.5 (*) 40.9 - 81.1 %   Platelets 232  150 - 400 K/uL   Neutrophils Relative % 69  43 - 77 %   Lymphocytes Relative 16  12 - 46 %   Monocytes Relative 14 (*) 3 - 12 %   Eosinophils Relative 0  0 - 5 %   Basophils Relative 1  0 - 1 %   Neutro Abs 7.2  1.7 - 7.7 K/uL   Lymphs Abs 1.6  0.7 - 4.0 K/uL   Monocytes Absolute 1.4 (*) 0.1 - 1.0 K/uL   Eosinophils Absolute 0.0  0.0 - 0.7 K/uL   Basophils Absolute 0.1  0.0 - 0.1 K/uL   RBC Morphology POLYCHROMASIA PRESENT     WBC Morphology MILD LEFT SHIFT (1-5% METAS, OCC MYELO, OCC BANDS)     Smear Review LARGE PLATELETS PRESENT    COMPREHENSIVE METABOLIC PANEL      Result  Value Range   Sodium 137  135 - 145 mEq/L   Potassium 3.7  3.5 - 5.1 mEq/L   Chloride 102  96 - 112 mEq/L   CO2 23  19 - 32 mEq/L   Glucose, Bld 119 (*) 70 - 99 mg/dL   BUN 25 (*) 6 - 23 mg/dL   Creatinine, Ser 2.13 (*) 0.50 - 1.10 mg/dL   Calcium 9.0  8.4 - 08.6 mg/dL   Total Protein 6.4  6.0 - 8.3 g/dL   Albumin 2.4 (*) 3.5 - 5.2 g/dL   AST 46 (*) 0 - 37 U/L   ALT 33  0 - 35 U/L   Alkaline Phosphatase 155 (*) 39 - 117 U/L   Total Bilirubin 0.2 (*) 0.3 - 1.2 mg/dL   GFR calc non Af Amer 43 (*) >90 mL/min   GFR calc Af Amer 49 (*) >90 mL/min  LIPASE, BLOOD      Result Value Range   Lipase 39  11 - 59 U/L  URINALYSIS, ROUTINE W REFLEX MICROSCOPIC      Result Value Range   Color, Urine YELLOW  YELLOW   APPearance CLOUDY (*) CLEAR   Specific Gravity, Urine 1.023  1.005 - 1.030   pH 5.0  5.0 - 8.0   Glucose, UA NEGATIVE  NEGATIVE mg/dL   Hgb urine dipstick NEGATIVE  NEGATIVE   Bilirubin Urine NEGATIVE  NEGATIVE   Ketones, ur NEGATIVE  NEGATIVE mg/dL   Protein, ur NEGATIVE  NEGATIVE mg/dL   Urobilinogen, UA 0.2  0.0 - 1.0 mg/dL   Nitrite POSITIVE (*) NEGATIVE   Leukocytes, UA NEGATIVE  NEGATIVE  URINE MICROSCOPIC-ADD ON      Result Value  Range   WBC, UA 3-6  <3 WBC/hpf   Bacteria, UA MANY (*) RARE      Dg Chest 2 View  10/24/2012   *RADIOLOGY REPORT*  Clinical Data: Lung cancer, weakness  CHEST - 2 VIEW  Comparison: CT chest dated 06/27/2012  Findings: 10.8 cm right mid lung mass (measured on the lateral view), increased.  Left lung is clear.  The heart is normal in size.  Mild degenerative changes of the visualized thoracolumbar spine.  IMPRESSION: 10.8 cm right mid lung mass, increased.   Original Report Authenticated By: Charline Bills, M.D.   Ct Head Wo Contrast  10/24/2012   *RADIOLOGY REPORT*  Clinical Data: Nausea/vomiting, lung cancer  CT HEAD WITHOUT CONTRAST  Technique:  Contiguous axial images were obtained from the base of the skull through the vertex without contrast.  Comparison: MRI brain dated 11/30/2011  Findings: No evidence of parenchymal hemorrhage or extra-axial fluid collection. No mass lesion, mass effect, or midline shift.  No CT evidence of acute infarction.  Subcortical white matter and periventricular small vessel ischemic changes.  Intracranial atherosclerosis.  Mild global cortical atrophy.  No ventriculomegaly.  The visualized paranasal sinuses are essentially clear. The mastoid air cells are unopacified.  No evidence of calvarial fracture.  IMPRESSION: No evidence of acute intracranial abnormality.  Atrophy with small vessel ischemic changes and intracranial atherosclerosis.   Original Report Authenticated By: Charline Bills, M.D.   1. Lung cancer, unspecified laterality   2. UTI (lower urinary tract infection)     MDM  Patient's workup consistent with urinary tract infection positive nitrite in the urine. This may explain the bilateral lower quadrant abdominal pain and pain in the back. Will treat with Cipro patient states she has an allergy to penicillin although not listed. Patient nontoxic  no acute distress abdomen without significant tenderness. Patient also had a head CT done due to balance  being on and off for 1-2 months. No evidence of acute intracranial abnormalities. Patient's CBC and electrolytes without any significant changes compared to previous labs. Patient is followed by hematology oncology clinic she will call in for followup tomorrow. Patient will return if abdominal pain gets worse or if not improved in 2 days.  Shelda Jakes, MD 10/24/12 2200

## 2012-10-24 NOTE — ED Notes (Signed)
Pt states that she has been not feeling well all weekend long c/o n/v, weakness, abd pain and her balance is off.  Pt has cancer and had her last dose of chemo last Monday.

## 2012-10-24 NOTE — Telephone Encounter (Signed)
WALK IN FORM TIMED AT 11:15AM. FORM HAD MENTIONED LOWER STOMACH PAIN ALL WEEK END AND TWO QUESTIONS CONCERNING HER MEDICATIONS [FOLIC ACID AND LORAZEPAM]. WENT TO LOBBY AT 11:50AM. [name] DID NOT SEE PT. AND CALLED PT.'S NAME. CHECKED WITH [name] IN THE LAB. SHE WAS FINISHED WITH PT. WHO DID NOT MENTION ANY PROBLEMS AT THAT TIME. REQUESTED [name] TO CALL IF PT. RETURNS TO THE LOBBY. AT 13:05 WENT BACK TO THE LOBBY TO SEE IF PT. RETURNED. DID NOT LOCATE PT. TOOK WALK IN FORM TO DR.MOHAMED'S NURSE, [name] BELL,RN. SHE HAS NOT RECEIVED A CALL FROM THIS PT. [name] WILL CALL PT.

## 2012-10-24 NOTE — ED Notes (Signed)
MD at bedside. 

## 2012-10-26 LAB — URINE CULTURE: Colony Count: >=100000

## 2012-10-27 NOTE — ED Notes (Signed)
Post ED Visit - Positive Culture Follow-up  Culture report reviewed by antimicrobial stewardship pharmacist: []  Wes Dulaney, Pharm.D., BCPS []  Celedonio Miyamoto, Pharm.D., BCPS [x]  Georgina Pillion, Pharm.D., BCPS []  [place], [address].D., BCPS, AAHIVP []  Estella Husk, Pharm.D., BCPS, AAHIVP  Positive Urine culture Treated with Cipro organism sensitive to the same and no further patient follow-up is required at this time.  [name] 10/27/2012, 5:44 PM

## 2012-10-31 ENCOUNTER — Ambulatory Visit: Payer: Medicare Other

## 2012-10-31 ENCOUNTER — Telehealth: Payer: Self-pay | Admitting: Internal Medicine

## 2012-10-31 ENCOUNTER — Other Ambulatory Visit (HOSPITAL_BASED_OUTPATIENT_CLINIC_OR_DEPARTMENT_OTHER): Payer: Medicare Other

## 2012-10-31 DIAGNOSIS — C3490 Malignant neoplasm of unspecified part of unspecified bronchus or lung: Secondary | ICD-10-CM

## 2012-10-31 LAB — CBC WITH DIFFERENTIAL/PLATELET
BASO%: 0.5 % (ref 0.0–2.0)
EOS%: 0.1 % (ref 0.0–7.0)
Eosinophils Absolute: 0 10*3/uL (ref 0.0–0.5)
MCHC: 32.8 g/dL (ref 31.5–36.0)
MCV: 99.4 fL (ref 79.5–101.0)
MONO%: 6.7 % (ref 0.0–14.0)
NEUT#: 9.2 10*3/uL — ABNORMAL HIGH (ref 1.5–6.5)
RBC: 2.54 10*6/uL — ABNORMAL LOW (ref 3.70–5.45)
RDW: 17.8 % — ABNORMAL HIGH (ref 11.2–14.5)

## 2012-10-31 LAB — COMPREHENSIVE METABOLIC PANEL (CC13)
ALT: 18 U/L (ref 0–55)
AST: 39 U/L — ABNORMAL HIGH (ref 5–34)
Albumin: 2.4 g/dL — ABNORMAL LOW (ref 3.5–5.0)
Alkaline Phosphatase: 179 U/L — ABNORMAL HIGH (ref 40–150)
Glucose: 123 mg/dl (ref 70–140)
Potassium: 3.6 mEq/L (ref 3.5–5.1)
Sodium: 144 mEq/L (ref 136–145)
Total Protein: 6.3 g/dL — ABNORMAL LOW (ref 6.4–8.3)

## 2012-10-31 NOTE — Telephone Encounter (Signed)
gave pt appt for lab , Md and chemo for July and August 2014

## 2012-11-07 ENCOUNTER — Ambulatory Visit (HOSPITAL_BASED_OUTPATIENT_CLINIC_OR_DEPARTMENT_OTHER): Payer: Medicare Other | Admitting: Internal Medicine

## 2012-11-07 ENCOUNTER — Encounter: Payer: Self-pay | Admitting: *Deleted

## 2012-11-07 ENCOUNTER — Encounter: Payer: Self-pay | Admitting: Internal Medicine

## 2012-11-07 ENCOUNTER — Other Ambulatory Visit (HOSPITAL_BASED_OUTPATIENT_CLINIC_OR_DEPARTMENT_OTHER): Payer: Medicare Other | Admitting: Lab

## 2012-11-07 ENCOUNTER — Ambulatory Visit (HOSPITAL_BASED_OUTPATIENT_CLINIC_OR_DEPARTMENT_OTHER): Payer: Medicare Other

## 2012-11-07 ENCOUNTER — Telehealth: Payer: Self-pay | Admitting: *Deleted

## 2012-11-07 ENCOUNTER — Encounter: Payer: Self-pay | Admitting: Medical Oncology

## 2012-11-07 ENCOUNTER — Telehealth: Payer: Self-pay | Admitting: Internal Medicine

## 2012-11-07 DIAGNOSIS — Z5111 Encounter for antineoplastic chemotherapy: Secondary | ICD-10-CM

## 2012-11-07 DIAGNOSIS — C778 Secondary and unspecified malignant neoplasm of lymph nodes of multiple regions: Secondary | ICD-10-CM

## 2012-11-07 DIAGNOSIS — C342 Malignant neoplasm of middle lobe, bronchus or lung: Secondary | ICD-10-CM

## 2012-11-07 DIAGNOSIS — M949 Disorder of cartilage, unspecified: Secondary | ICD-10-CM

## 2012-11-07 DIAGNOSIS — J449 Chronic obstructive pulmonary disease, unspecified: Secondary | ICD-10-CM

## 2012-11-07 LAB — COMPREHENSIVE METABOLIC PANEL (CC13)
AST: 53 U/L — ABNORMAL HIGH (ref 5–34)
Albumin: 2.8 g/dL — ABNORMAL LOW (ref 3.5–5.0)
Alkaline Phosphatase: 154 U/L — ABNORMAL HIGH (ref 40–150)
BUN: 32.2 mg/dL — ABNORMAL HIGH (ref 7.0–26.0)
Potassium: 4.2 mEq/L (ref 3.5–5.1)
Sodium: 140 mEq/L (ref 136–145)
Total Bilirubin: 0.23 mg/dL (ref 0.20–1.20)
Total Protein: 7.4 g/dL (ref 6.4–8.3)

## 2012-11-07 LAB — CBC WITH DIFFERENTIAL/PLATELET
EOS%: 0 % (ref 0.0–7.0)
LYMPH%: 6.4 % — ABNORMAL LOW (ref 14.0–49.7)
MCH: 31.4 pg (ref 25.1–34.0)
MCV: 101.8 fL — ABNORMAL HIGH (ref 79.5–101.0)
MONO%: 5.5 % (ref 0.0–14.0)
Platelets: 389 10*3/uL (ref 145–400)
RBC: 2.77 10*6/uL — ABNORMAL LOW (ref 3.70–5.45)
RDW: 17.7 % — ABNORMAL HIGH (ref 11.2–14.5)

## 2012-11-07 MED ORDER — ONDANSETRON 8 MG/50ML IVPB (CHCC)
8.0000 mg | Freq: Once | INTRAVENOUS | Status: AC
Start: 2012-11-07 — End: 2012-11-07
  Administered 2012-11-07: 8 mg via INTRAVENOUS

## 2012-11-07 MED ORDER — DEXAMETHASONE SODIUM PHOSPHATE 10 MG/ML IJ SOLN
10.0000 mg | Freq: Once | INTRAMUSCULAR | Status: AC
  Administered 2012-11-07: 10 mg via INTRAVENOUS

## 2012-11-07 MED ORDER — SODIUM CHLORIDE 0.9 % IV SOLN
Freq: Once | INTRAVENOUS | Status: AC
  Administered 2012-11-07: 11:00:00 via INTRAVENOUS

## 2012-11-07 MED ORDER — DOCETAXEL CHEMO INJECTION 160 MG/16ML
75.0000 mg/m2 | Freq: Once | INTRAVENOUS | Status: AC
  Administered 2012-11-07: 120 mg via INTRAVENOUS
  Filled 2012-11-07: qty 12

## 2012-11-07 NOTE — Progress Notes (Signed)
11/07/2012 BMS [id number] Patient in the cancer center this morning for lab work, physical exam, and chemotherapy. PRO questionnaires given to her before her MD appointment. She completed them independently.

## 2012-11-07 NOTE — Telephone Encounter (Signed)
Per staff message and POF I have scheduled appts.  JMW  

## 2012-11-07 NOTE — Patient Instructions (Addendum)
Continue chemotherapy today as scheduled.  Followup visit in 3 weeks with repeat CT scan of the chest, abdomen and pelvis. 

## 2012-11-07 NOTE — Patient Instructions (Addendum)
[facility] Discharge Instructions for Patients Receiving Chemotherapy  Today you received the following chemotherapy agents :  Taxotere.  To help prevent nausea and vomiting after your treatment, we encourage you to take your nausea medication as instructed by your physician.   If you develop nausea and vomiting that is not controlled by your nausea medication, call the clinic.   BELOW ARE SYMPTOMS THAT SHOULD BE REPORTED IMMEDIATELY:  *FEVER GREATER THAN 100.5 F  *CHILLS WITH OR WITHOUT FEVER  NAUSEA AND VOMITING THAT IS NOT CONTROLLED WITH YOUR NAUSEA MEDICATION  *UNUSUAL SHORTNESS OF BREATH  *UNUSUAL BRUISING OR BLEEDING  TENDERNESS IN MOUTH AND THROAT WITH OR WITHOUT PRESENCE OF ULCERS  *URINARY PROBLEMS  *BOWEL PROBLEMS  UNUSUAL RASH Items with * indicate a potential emergency and should be followed up as soon as possible.  Feel free to call the clinic you have any questions or concerns. The clinic phone number is [phone].    

## 2012-11-07 NOTE — Progress Notes (Signed)
Daughter came to tell me [name] is having problems with her vision. She complains that when she drives she has trouble reading the road signs and also some near vision problems. [name] has hx of eye surgery . "it did not go well" I told her I would let Dr Arbutus Ped know and I also told her sometimes the chemotherapy can affect the vision.

## 2012-11-07 NOTE — Progress Notes (Signed)
[facility] [phone]   [phone]  OFFICE PROGRESS NOTE  [name], MD [address] [place] [address]  DIAGNOSIS: Metastatic non-small cell lung cancer, adenocarcinoma with negative EGFR mutation and negative ALK gene translocation diagnosed in August of 2013   PRIOR THERAPY:  1) Systemic chemotherapy with carboplatin for AUC of 5 and Alimta 500 mg/M2 every 3 weeks. The patient is status post 6 cycles.  2) Maintenance chemotherapy with Alimta at 500 mg/M2 every 3 weeks. Status post 3 cycles discontinued today secondary to disease progression.  3) Tarceva 150 mg by mouth daily, status post 2 months of therapy. Therapy beginning 3/22/20114.   CURRENT THERAPY: Docetaxel 75 mg/M2 every 3 weeks with Neulasta support, first dose was given on 09/26/2012. S/p 2 cycles.  INTERVAL HISTORY: [name] 79 y.o. female returns to the clinic today for followup visit acompanied by her daughter. The patient tolerated the second cycle of her systemic chemotherapy fairly well with no significant complaints except for mild fatigue 2-3 days after her chemotherapy. The patient denied having any significant nausea or vomiting. She denied having any fever or chills. She has no significant chest pain, shortness ofbreath, cough or hemoptysis. She is here today to start cycle #3 of her chemotherapy.  MEDICAL HISTORY: Past Medical History  Diagnosis Date  . Cervical spondylosis   . Renal cyst     simple bilateral 2008  . Diverticulosis   . Vertigo, benign positional   . Hyperlipidemia   . Aortic sclerosis     by exam,  . Erosive osteoarthritis of hand   . History of calcium pyrophosphate deposition disease (CPPD)   . Gout     vs pseudo gout flares in past  . Mediastinal adenopathy   . Tobacco use   . COPD, mild   . Glaucoma   . Hypertension   . Osteopenia   . Hiatal hernia   . Hemorrhoids   . H/O urinary incontinence   .  Childhood asthma   . Bronchitis   . TIA (transient ischemic attack)   . Claustrophobia   . Dizziness   . Bleeding from the nose     hx of severe nose bleeds-last time was 2 yrs ago, slight bleed last week  . lung ca 11/2011    ALLERGIES:  is allergic to shellfish allergy; septra ds; statins; tape; wellbutrin; and taxotere.  MEDICATIONS:  Current Outpatient Prescriptions  Medication Sig Dispense Refill  . ADVAIR DISKUS 250-50 MCG/DOSE AEPB Inhale 1 puff into the lungs daily as needed (shortness of breath).       Marland Kitchen allopurinol (ZYLOPRIM) 100 MG tablet Take 100 mg by mouth every morning.       Marland Kitchen ALPRAZolam (XANAX) 0.25 MG tablet 0.25 mg 3 (three) times daily as needed (nausea).       Marland Kitchen aspirin 81 MG tablet Take 81 mg by mouth every evening.       . B Complex-C (B-COMPLEX WITH VITAMIN C) tablet Take 1 tablet by mouth every morning.       . Cholecalciferol (VITAMIN D) 2000 UNITS tablet Take 2,000 Units by mouth every evening.       . estrogens, conjugated, (PREMARIN) 0.625 MG tablet Take 0.625 mg by mouth every morning.       . fish oil-omega-3 fatty acids 1000 MG capsule Take 1 g by mouth 2 (two) times daily.       Marland Kitchen ipratropium (ATROVENT) 0.06 %  nasal spray Place 2 sprays into the nose 4 (four) times daily as needed.       Marland Kitchen LORazepam (ATIVAN) 0.5 MG tablet Take 1 tablet (0.5 mg total) by mouth every 8 (eight) hours as needed for anxiety.  30 tablet  0  . Multiple Vitamin (MULTIVITAMIN WITH MINERALS) TABS Take 1 tablet by mouth every morning.       . nebivolol (BYSTOLIC) 5 MG tablet Take 5 mg by mouth every evening.       . ondansetron (ZOFRAN-ODT) 8 MG disintegrating tablet TAKE 1 TABLET UNDER TONGUE EVERY 6 TO 8 HOURS AS NEEDED FOR NAUSEA/VOMITING  30 tablet  1  . timolol (TIMOPTIC-XR) 0.5 % ophthalmic gel-forming Place 1 drop into the left eye 2 (two) times daily.       . Alum & Mag Hydroxide-Simeth (MAGIC MOUTHWASH) SOLN Take 5 mLs by mouth 4 (four) times daily as needed (mouth sores).       Marland Kitchen diltiazem 2 % GEL Apply 1 application topically 2 (two) times daily as needed (hemmorrhoids).        No current facility-administered medications for this visit.    SURGICAL HISTORY:  Past Surgical History  Procedure Laterality Date  . Cystoscopy, transurethral collagen injection therapy    . Left total knee arthroplasty    . Appendectomy    . Hemorrhoid surgery    . Cataract surgery x 2    . Eye surgery    . Tonsillectomy    . Abdominal hysterectomy    . Colonoscopy      REVIEW OF SYSTEMS:  A comprehensive review of systems was negative except for: Constitutional: positive for fatigue   PHYSICAL EXAMINATION: General appearance: alert, cooperative and no distress Head: Normocephalic, without obvious abnormality, atraumatic Neck: no adenopathy Lymph nodes: Cervical, supraclavicular, and axillary nodes normal. Resp: clear to auscultation bilaterally Cardio: regular rate and rhythm, S1, S2 normal, no murmur, click, rub or gallop GI: soft, non-tender; bowel sounds normal; no masses,  no organomegaly Extremities: extremities normal, atraumatic, no cyanosis or edema  ECOG PERFORMANCE STATUS: 1 - Symptomatic but completely ambulatory  Blood pressure 160/72, pulse 69, temperature 97.6 F (36.4 C), temperature source Oral, resp. rate 17, height 5\' 1"  (1.549 m), weight 131 lb 11.2 oz (59.739 kg).  LABORATORY DATA: Lab Results  Component Value Date   WBC 11.3* 10/31/2012   HGB 8.3* 10/31/2012   HCT 25.3* 10/31/2012   MCV 99.4 10/31/2012   PLT 196 10/31/2012      Chemistry      Component Value Date/Time   NA 144 10/31/2012 1237   NA 137 10/24/2012 1808   K 3.6 10/31/2012 1237   K 3.7 10/24/2012 1808   CL 102 10/24/2012 1808   CL 107 09/26/2012 1247   CO2 24 10/31/2012 1237   CO2 23 10/24/2012 1808   BUN 18.1 10/31/2012 1237   BUN 25* 10/24/2012 1808   CREATININE 1.1 10/31/2012 1237   CREATININE 1.17* 10/24/2012 1808      Component Value Date/Time   CALCIUM 8.6 10/31/2012 1237    CALCIUM 9.0 10/24/2012 1808   ALKPHOS 179* 10/31/2012 1237   ALKPHOS 155* 10/24/2012 1808   AST 39* 10/31/2012 1237   AST 46* 10/24/2012 1808   ALT 18 10/31/2012 1237   ALT 33 10/24/2012 1808   BILITOT 0.25 10/31/2012 1237   BILITOT 0.2* 10/24/2012 1808       RADIOGRAPHIC STUDIES: Dg Chest 2 View  10/24/2012   *RADIOLOGY REPORT*  Clinical  Data: Lung cancer, weakness  CHEST - 2 VIEW  Comparison: CT chest dated 06/27/2012  Findings: 10.8 cm right mid lung mass (measured on the lateral view), increased.  Left lung is clear.  The heart is normal in size.  Mild degenerative changes of the visualized thoracolumbar spine.  IMPRESSION: 10.8 cm right mid lung mass, increased.   Original Report Authenticated By: Charline Bills, M.D.   Ct Head Wo Contrast  10/24/2012   *RADIOLOGY REPORT*  Clinical Data: Nausea/vomiting, lung cancer  CT HEAD WITHOUT CONTRAST  Technique:  Contiguous axial images were obtained from the base of the skull through the vertex without contrast.  Comparison: MRI brain dated 11/30/2011  Findings: No evidence of parenchymal hemorrhage or extra-axial fluid collection. No mass lesion, mass effect, or midline shift.  No CT evidence of acute infarction.  Subcortical white matter and periventricular small vessel ischemic changes.  Intracranial atherosclerosis.  Mild global cortical atrophy.  No ventriculomegaly.  The visualized paranasal sinuses are essentially clear. The mastoid air cells are unopacified.  No evidence of calvarial fracture.  IMPRESSION: No evidence of acute intracranial abnormality.  Atrophy with small vessel ischemic changes and intracranial atherosclerosis.   Original Report Authenticated By: Charline Bills, M.D.    ASSESSMENT AND PLAN: This is a very pleasant 79 years old white female with metastatic non-small cell lung cancer, adenocarcinoma currently undergoing systemic chemotherapy with docetaxel status post 2 cycles. The patient is tolerating her treatment fairly well with no  significant adverse effects. I recommended for her to proceed with cycle #3 today as scheduled.  I would see the patient back for followup visit in 3 weeks with repeat CT scan of the chest, abdomen and pelvis for restaging of her disease. She was advised to call immediately she has any concerning symptoms in the interval. All questions were answered. The patient knows to call the clinic with any problems, questions or concerns. We can certainly see the patient much sooner if necessary.

## 2012-11-07 NOTE — Telephone Encounter (Signed)
Gave pt appt for lab, Md and chemo for july and August 2014

## 2012-11-08 ENCOUNTER — Ambulatory Visit: Payer: Medicare Other | Admitting: Internal Medicine

## 2012-11-08 ENCOUNTER — Ambulatory Visit (HOSPITAL_BASED_OUTPATIENT_CLINIC_OR_DEPARTMENT_OTHER): Payer: Medicare Other

## 2012-11-08 DIAGNOSIS — C342 Malignant neoplasm of middle lobe, bronchus or lung: Secondary | ICD-10-CM

## 2012-11-08 MED ORDER — PEGFILGRASTIM INJECTION 6 MG/0.6ML
6.0000 mg | Freq: Once | SUBCUTANEOUS | Status: AC
  Administered 2012-11-08: 6 mg via SUBCUTANEOUS
  Filled 2012-11-08: qty 0.6

## 2012-11-10 ENCOUNTER — Telehealth: Payer: Self-pay | Admitting: Medical Oncology

## 2012-11-10 NOTE — Telephone Encounter (Signed)
Legs and stomach hurting since Neulasta on Tuesday. I instructed daughter to have Eshika take Tylenol and call me back with update tomorrow.

## 2012-11-11 ENCOUNTER — Other Ambulatory Visit: Payer: Self-pay | Admitting: Medical Oncology

## 2012-11-11 ENCOUNTER — Telehealth: Payer: Self-pay | Admitting: Medical Oncology

## 2012-11-11 MED ORDER — OXYCODONE-ACETAMINOPHEN 5-325 MG PO TABS: 1.0000 | ORAL_TABLET | Freq: Four times a day (QID) | ORAL | Status: DC | PRN

## 2012-11-11 NOTE — Telephone Encounter (Signed)
Pt tried tylenol last night and has been taking allegra for muscle ache , bone pain but not effective. Per Dr Arbutus Ped he ordered Percocet . Pt notified  . Rx locked in injection room

## 2012-11-14 ENCOUNTER — Other Ambulatory Visit (HOSPITAL_BASED_OUTPATIENT_CLINIC_OR_DEPARTMENT_OTHER): Payer: Medicare Other

## 2012-11-14 ENCOUNTER — Other Ambulatory Visit: Payer: Self-pay | Admitting: *Deleted

## 2012-11-14 ENCOUNTER — Ambulatory Visit (HOSPITAL_BASED_OUTPATIENT_CLINIC_OR_DEPARTMENT_OTHER): Payer: Medicare Other

## 2012-11-14 DIAGNOSIS — C778 Secondary and unspecified malignant neoplasm of lymph nodes of multiple regions: Secondary | ICD-10-CM

## 2012-11-14 DIAGNOSIS — C342 Malignant neoplasm of middle lobe, bronchus or lung: Secondary | ICD-10-CM

## 2012-11-14 DIAGNOSIS — Z5189 Encounter for other specified aftercare: Secondary | ICD-10-CM

## 2012-11-14 DIAGNOSIS — C3490 Malignant neoplasm of unspecified part of unspecified bronchus or lung: Secondary | ICD-10-CM

## 2012-11-14 LAB — CBC WITH DIFFERENTIAL/PLATELET
Basophils Absolute: 0.1 10*3/uL (ref 0.0–0.1)
EOS%: 0.1 % (ref 0.0–7.0)
Eosinophils Absolute: 0 10*3/uL (ref 0.0–0.5)
HGB: 10.1 g/dL — ABNORMAL LOW (ref 11.6–15.9)
LYMPH%: 13.5 % — ABNORMAL LOW (ref 14.0–49.7)
MCH: 32 pg (ref 25.1–34.0)
MCV: 101.3 fL — ABNORMAL HIGH (ref 79.5–101.0)
MONO%: 15.3 % — ABNORMAL HIGH (ref 0.0–14.0)
NEUT#: 10.1 10*3/uL — ABNORMAL HIGH (ref 1.5–6.5)
Platelets: 303 10*3/uL (ref 145–400)
RDW: 17.1 % — ABNORMAL HIGH (ref 11.2–14.5)

## 2012-11-14 LAB — COMPREHENSIVE METABOLIC PANEL (CC13)
Alkaline Phosphatase: 182 U/L — ABNORMAL HIGH (ref 40–150)
BUN: 27 mg/dL — ABNORMAL HIGH (ref 7.0–26.0)
CO2: 24 mEq/L (ref 22–29)
Creatinine: 1.3 mg/dL — ABNORMAL HIGH (ref 0.6–1.1)
Glucose: 100 mg/dl (ref 70–140)
Sodium: 137 mEq/L (ref 136–145)
Total Bilirubin: 0.45 mg/dL (ref 0.20–1.20)

## 2012-11-14 LAB — TECHNOLOGIST REVIEW

## 2012-11-14 MED ORDER — SODIUM CHLORIDE 0.9 % IV SOLN
Freq: Once | INTRAVENOUS | Status: DC
  Administered 2012-11-14: 1000 mL via INTRAVENOUS

## 2012-11-14 NOTE — Patient Instructions (Addendum)
Dehydration, Adult  Dehydration means your body does not have as much fluid as it needs. Your kidneys, brain, and heart will not work properly without the right amount of fluids and salt.   HOME CARE   Ask your doctor how to replace body fluid losses (rehydrate).   Drink enough fluids to keep your pee (urine) clear or pale yellow.   Drink small amounts of fluids often if you feel sick to your stomach (nauseous) or throw up (vomit).   Eat like you normally do.   Avoid:   Foods or drinks high in sugar.   Bubbly (carbonated) drinks.   Juice.   Very hot or cold fluids.   Drinks with caffeine.   Fatty, greasy foods.   Alcohol.   Tobacco.   Eating too much.   Gelatin desserts.   Wash your hands to avoid spreading germs (bacteria, viruses).   Only take medicine as told by your doctor.   Keep all doctor visits as told.  GET HELP RIGHT AWAY IF:    You cannot drink something without throwing up.   You get worse even with treatment.   Your vomit has blood in it or looks greenish.   Your poop (stool) has blood in it or looks black and tarry.   You have not peed in 6 to 8 hours.   You pee a small amount of very dark pee.   You have a fever.   You pass out (faint).   You have belly (abdominal) pain that gets worse or stays in one spot (localizes).   You have a rash, stiff neck, or bad headache.   You get easily annoyed, sleepy, or are hard to wake up.   You feel weak, dizzy, or very thirsty.  MAKE SURE YOU:    Understand these instructions.   Will watch your condition.   Will get help right away if you are not doing well or get worse.  Document Released: 01/31/2009 Document Revised: 06/29/2011 Document Reviewed: 11/24/2010  [employer] Patient Information 2014 [employer].

## 2012-11-14 NOTE — Progress Notes (Signed)
Pt's daughter called stating that pt has not been feeling well, she is very weak, and has not been eating, they are requesting IVF today.  Per Dimas [place], okay to give IVF today.  Pt is aware of the appt.  SLJ

## 2012-11-21 ENCOUNTER — Other Ambulatory Visit (HOSPITAL_BASED_OUTPATIENT_CLINIC_OR_DEPARTMENT_OTHER): Payer: Medicare Other | Admitting: Lab

## 2012-11-21 DIAGNOSIS — C3490 Malignant neoplasm of unspecified part of unspecified bronchus or lung: Secondary | ICD-10-CM

## 2012-11-21 LAB — CBC WITH DIFFERENTIAL/PLATELET
BASO%: 0.4 % (ref 0.0–2.0)
Basophils Absolute: 0 10*3/uL (ref 0.0–0.1)
Eosinophils Absolute: 0 10*3/uL (ref 0.0–0.5)
HCT: 27.2 % — ABNORMAL LOW (ref 34.8–46.6)
HGB: 8.9 g/dL — ABNORMAL LOW (ref 11.6–15.9)
LYMPH%: 11.7 % — ABNORMAL LOW (ref 14.0–49.7)
MCHC: 32.7 g/dL (ref 31.5–36.0)
MONO#: 0.9 10*3/uL (ref 0.1–0.9)
NEUT#: 9 10*3/uL — ABNORMAL HIGH (ref 1.5–6.5)
NEUT%: 79.7 % — ABNORMAL HIGH (ref 38.4–76.8)
Platelets: 214 10*3/uL (ref 145–400)
WBC: 11.3 10*3/uL — ABNORMAL HIGH (ref 3.9–10.3)
lymph#: 1.3 10*3/uL (ref 0.9–3.3)

## 2012-11-21 LAB — COMPREHENSIVE METABOLIC PANEL (CC13)
ALT: 16 U/L (ref 0–55)
CO2: 23 mEq/L (ref 22–29)
Calcium: 9.1 mg/dL (ref 8.4–10.4)
Chloride: 109 mEq/L (ref 98–109)
Creatinine: 0.9 mg/dL (ref 0.6–1.1)
Glucose: 104 mg/dl (ref 70–140)
Total Protein: 6.5 g/dL (ref 6.4–8.3)

## 2012-11-23 ENCOUNTER — Encounter: Payer: Self-pay | Admitting: Internal Medicine

## 2012-11-23 ENCOUNTER — Other Ambulatory Visit: Payer: Self-pay | Admitting: Medical Oncology

## 2012-11-23 ENCOUNTER — Telehealth: Payer: Self-pay | Admitting: Internal Medicine

## 2012-11-23 ENCOUNTER — Telehealth: Payer: Self-pay | Admitting: Medical Oncology

## 2012-11-23 ENCOUNTER — Ambulatory Visit (HOSPITAL_BASED_OUTPATIENT_CLINIC_OR_DEPARTMENT_OTHER): Payer: Medicare Other | Admitting: Internal Medicine

## 2012-11-23 ENCOUNTER — Ambulatory Visit (HOSPITAL_COMMUNITY)
Admission: RE | Admit: 2012-11-23 | Discharge: 2012-11-23 | Disposition: A | Discharge status: Home / Self Care | Payer: Medicare Other | Source: Ambulatory Visit | Attending: Internal Medicine | Admitting: Internal Medicine

## 2012-11-23 DIAGNOSIS — C3490 Malignant neoplasm of unspecified part of unspecified bronchus or lung: Secondary | ICD-10-CM | POA: Insufficient documentation

## 2012-11-23 DIAGNOSIS — C342 Malignant neoplasm of middle lobe, bronchus or lung: Secondary | ICD-10-CM

## 2012-11-23 NOTE — Telephone Encounter (Signed)
, °

## 2012-11-23 NOTE — Patient Instructions (Addendum)
Followup visit as scheduled early next week.  CURRENT THERAPY: Docetaxel 75 mg/M2 every 3 weeks with Neulasta support, first dose was given on 09/26/2012. S/p 3 cycles.  CHEMOTHERAPY INTENT: Palliative  CURRENT # OF CHEMOTHERAPY CYCLES: 3  CURRENT ANTIEMETICS: Zofran, dexamethasone and Compazine  CURRENT SMOKING STATUS: Quit smoking  ORAL CHEMOTHERAPY AND CONSENT: None  CURRENT BISPHOSPHONATES USE: None  PAIN MANAGEMENT: 3/10  NARCOTICS INDUCED CONSTIPATION: None  LIVING WILL AND CODE STATUS: Currently full code.

## 2012-11-23 NOTE — Telephone Encounter (Signed)
I called pt who reported that for the past 2 days she has not been feeling good with increased coughing ,SOB when she talks Cough is non productive . She has an advair inhaler which she is having trouble administering a dose. I talked her through the steps and she will try it. Per Dr Arbutus Ped schedule pt with xray prior to visit.

## 2012-11-23 NOTE — Progress Notes (Signed)
[facility] Telephone:([phone]   Fax:([phone]  OFFICE PROGRESS NOTE  [name], MD [address] [place] [address]  DIAGNOSIS AND STAGE: Metastatic non-small cell lung cancer, adenocarcinoma with negative EGFR mutation and negative ALK gene translocation diagnosed in August of 2013   PRIOR THERAPY:  1) Systemic chemotherapy with carboplatin for AUC of 5 and Alimta 500 mg/M2 every 3 weeks. The patient is status post 6 cycles.  2) Maintenance chemotherapy with Alimta at 500 mg/M2 every 3 weeks. Status post 3 cycles discontinued today secondary to disease progression.  3) Tarceva 150 mg by mouth daily, status post 2 months of therapy. Therapy beginning 3/22/20114.   CURRENT THERAPY: Docetaxel 75 mg/M2 every 3 weeks with Neulasta support, first dose was given on 09/26/2012. S/p 3 cycles.  CHEMOTHERAPY INTENT: Palliative  CURRENT # OF CHEMOTHERAPY CYCLES: 3  CURRENT ANTIEMETICS: Zofran, dexamethasone and Compazine  CURRENT SMOKING STATUS: Quit smoking  ORAL CHEMOTHERAPY AND CONSENT: None  CURRENT BISPHOSPHONATES USE: None  PAIN MANAGEMENT: 3/10  NARCOTICS INDUCED CONSTIPATION: None  LIVING WILL AND CODE STATUS: Currently full code.  INTERVAL HISTORY: [name] 79 y.o. female returns to the clinic today for walk in visit accompanied by her daughter for evaluation of shortness of breath. The patient has been doing fine with no specific complaints except for increasing fatigue and shortness of breath recently. She denied having any significant fever or chills. She denied having any cough or hemoptysis. No chest pain. She requested to be seen today because of the worsening dyspnea and fatigue. She tolerated the last cycle of her systemic chemotherapy fairly well. The patient is supposed to come next week for reevaluation with repeat CT scan of the chest, abdomen and pelvis for restaging of her disease. She has no other  significant complaints today.  MEDICAL HISTORY: Past Medical History  Diagnosis Date  . Cervical spondylosis   . Renal cyst     simple bilateral 2008  . Diverticulosis   . Vertigo, benign positional   . Hyperlipidemia   . Aortic sclerosis     by exam,  . Erosive osteoarthritis of hand   . History of calcium pyrophosphate deposition disease (CPPD)   . Gout     vs pseudo gout flares in past  . Mediastinal adenopathy   . Tobacco use   . COPD, mild   . Glaucoma   . Hypertension   . Osteopenia   . Hiatal hernia   . Hemorrhoids   . H/O urinary incontinence   . Childhood asthma   . Bronchitis   . TIA (transient ischemic attack)   . Claustrophobia   . Dizziness   . Bleeding from the nose     hx of severe nose bleeds-last time was 2 yrs ago, slight bleed last week  . lung ca 11/2011    ALLERGIES:  is allergic to shellfish allergy; septra ds; statins; tape; wellbutrin; and taxotere.  MEDICATIONS:  Current Outpatient Prescriptions  Medication Sig Dispense Refill  . ADVAIR DISKUS 250-50 MCG/DOSE AEPB Inhale 1 puff into the lungs daily as needed (shortness of breath).       . ALPRAZolam (XANAX) 0.25 MG tablet 0.25 mg 3 (three) times daily as needed (nausea).       . Alum & Mag Hydroxide-Simeth (MAGIC MOUTHWASH) SOLN Take 5 mLs by mouth 4 (four) times daily as needed (mouth sores).      Marland Kitchen aspirin 81 MG tablet Take 81  mg by mouth every evening.       . B Complex-C (B-COMPLEX WITH VITAMIN C) tablet Take 1 tablet by mouth every morning.       . Cholecalciferol (VITAMIN D) 2000 UNITS tablet Take 2,000 Units by mouth every evening.       . diltiazem 2 % GEL Apply 1 application topically 2 (two) times daily as needed (hemmorrhoids).       . estrogens, conjugated, (PREMARIN) 0.625 MG tablet Take 0.625 mg by mouth every morning.       . fish oil-omega-3 fatty acids 1000 MG capsule Take 1 g by mouth 2 (two) times daily.       Marland Kitchen ipratropium (ATROVENT) 0.06 % nasal spray Place 2 sprays into  the nose 4 (four) times daily as needed.       Marland Kitchen LORazepam (ATIVAN) 0.5 MG tablet Take 1 tablet (0.5 mg total) by mouth every 8 (eight) hours as needed for anxiety.  30 tablet  0  . Multiple Vitamin (MULTIVITAMIN WITH MINERALS) TABS Take 1 tablet by mouth every morning.       . nebivolol (BYSTOLIC) 5 MG tablet Take 5 mg by mouth every evening.       . ondansetron (ZOFRAN-ODT) 8 MG disintegrating tablet TAKE 1 TABLET UNDER TONGUE EVERY 6 TO 8 HOURS AS NEEDED FOR NAUSEA/VOMITING  30 tablet  1  . oxyCODONE-acetaminophen (PERCOCET/ROXICET) 5-325 MG per tablet Take 1 tablet by mouth every 6 (six) hours as needed for pain.  30 tablet  0  . timolol (TIMOPTIC-XR) 0.5 % ophthalmic gel-forming Place 1 drop into the left eye 2 (two) times daily.       Marland Kitchen allopurinol (ZYLOPRIM) 100 MG tablet Take 100 mg by mouth every morning.        No current facility-administered medications for this visit.    SURGICAL HISTORY:  Past Surgical History  Procedure Laterality Date  . Cystoscopy, transurethral collagen injection therapy    . Left total knee arthroplasty    . Appendectomy    . Hemorrhoid surgery    . Cataract surgery x 2    . Eye surgery    . Tonsillectomy    . Abdominal hysterectomy    . Colonoscopy      REVIEW OF SYSTEMS:  A comprehensive review of systems was negative except for: Constitutional: positive for fatigue Respiratory: positive for dyspnea on exertion   PHYSICAL EXAMINATION: General appearance: alert, cooperative, fatigued and no distress Head: Normocephalic, without obvious abnormality, atraumatic Neck: no adenopathy Lymph nodes: Cervical, supraclavicular, and axillary nodes normal. Resp: clear to auscultation bilaterally Cardio: regular rate and rhythm, S1, S2 normal, no murmur, click, rub or gallop GI: soft, non-tender; bowel sounds normal; no masses,  no organomegaly Extremities: extremities normal, atraumatic, no cyanosis or edema  ECOG PERFORMANCE STATUS: 1 - Symptomatic but  completely ambulatory  Blood pressure 123/63, pulse 94, temperature 97.5 F (36.4 C), temperature source Oral, resp. rate 18, height 5\' 1"  (1.549 m), weight 130 lb 3.2 oz (59.058 kg).  LABORATORY DATA: Lab Results  Component Value Date   WBC 11.3* 11/21/2012   HGB 8.9* 11/21/2012   HCT 27.2* 11/21/2012   MCV 101.0 11/21/2012   PLT 214 11/21/2012      Chemistry      Component Value Date/Time   NA 142 11/21/2012 0815   NA 137 10/24/2012 1808   K 4.0 11/21/2012 0815   K 3.7 10/24/2012 1808   CL 102 10/24/2012 1808   CL 107  09/26/2012 1247   CO2 23 11/21/2012 0815   CO2 23 10/24/2012 1808   BUN 22.7 11/21/2012 0815   BUN 25* 10/24/2012 1808   CREATININE 0.9 11/21/2012 0815   CREATININE 1.17* 10/24/2012 1808      Component Value Date/Time   CALCIUM 9.1 11/21/2012 0815   CALCIUM 9.0 10/24/2012 1808   ALKPHOS 199* 11/21/2012 0815   ALKPHOS 155* 10/24/2012 1808   AST 42* 11/21/2012 0815   AST 46* 10/24/2012 1808   ALT 16 11/21/2012 0815   ALT 33 10/24/2012 1808   BILITOT 0.32 11/21/2012 0815   BILITOT 0.2* 10/24/2012 1808       RADIOGRAPHIC STUDIES: Dg Chest 2 View  11/23/2012   *RADIOLOGY REPORT*  Clinical Data: Previously documented lung carcinoma presenting with increase shortness of breath and cough  CHEST - 2 VIEW  Comparison:  October 24, 2012  Findings: There is again noted a mass arising in the right middle lobe, currently measuring 10.2 x 6.7 x 8.8 cm.  Elsewhere, the lungs clear.  The heart size and pulmonary vascularity normal.  No adenopathy.  No bone lesions.  IMPRESSION: Large mass arising from the right middle lobe again noted.  No consolidation.  No new opacity.   Original Report Authenticated By: Bretta Bang, M.D.   Dg Chest 2 View  10/24/2012   *RADIOLOGY REPORT*  Clinical Data: Lung cancer, weakness  CHEST - 2 VIEW  Comparison: CT chest dated 06/27/2012  Findings: 10.8 cm right mid lung mass (measured on the lateral view), increased.  Left lung is clear.  The heart is normal in size.  Mild degenerative  changes of the visualized thoracolumbar spine.  IMPRESSION: 10.8 cm right mid lung mass, increased.   Original Report Authenticated By: Charline Bills, M.D.   Ct Head Wo Contrast  10/24/2012   *RADIOLOGY REPORT*  Clinical Data: Nausea/vomiting, lung cancer  CT HEAD WITHOUT CONTRAST  Technique:  Contiguous axial images were obtained from the base of the skull through the vertex without contrast.  Comparison: MRI brain dated 11/30/2011  Findings: No evidence of parenchymal hemorrhage or extra-axial fluid collection. No mass lesion, mass effect, or midline shift.  No CT evidence of acute infarction.  Subcortical white matter and periventricular small vessel ischemic changes.  Intracranial atherosclerosis.  Mild global cortical atrophy.  No ventriculomegaly.  The visualized paranasal sinuses are essentially clear. The mastoid air cells are unopacified.  No evidence of calvarial fracture.  IMPRESSION: No evidence of acute intracranial abnormality.  Atrophy with small vessel ischemic changes and intracranial atherosclerosis.   Original Report Authenticated By: Charline Bills, M.D.    ASSESSMENT AND PLAN: This is a very pleasant 79 years old white female with metastatic non-small cell lung cancer, adenocarcinoma currently on treatment with docetaxel status post 3 cycles. The patient was seen today for evaluation of persistent dyspnea which was getting worse. Chest x-ray performed earlier today showed no significant abnormalities other than the persistent large mass in the right middle lobe. There is no evidence for pneumonia or pleural effusion. The patient feels a little bit better today. I recommended for her to continue on observation for now and to call us immediately if she develops any fever or chills of her dyspnea gets worse. I would see her back for followup visit in early next week with repeat CT scan of the chest, abdomen and pelvis for restaging of her disease.  The patient voices understanding of  current disease status and treatment options and is in agreement with  the current care plan.  All questions were answered. The patient knows to call the clinic with any problems, questions or concerns. We can certainly see the patient much sooner if necessary.

## 2012-11-23 NOTE — Telephone Encounter (Signed)
CXR order sent and pt notified to go to radiology.

## 2012-11-25 ENCOUNTER — Encounter (HOSPITAL_COMMUNITY): Payer: Self-pay

## 2012-11-25 ENCOUNTER — Other Ambulatory Visit: Payer: Self-pay | Admitting: *Deleted

## 2012-11-25 ENCOUNTER — Ambulatory Visit (HOSPITAL_COMMUNITY)
Admission: RE | Admit: 2012-11-25 | Discharge: 2012-11-25 | Disposition: A | Discharge status: Home / Self Care | Payer: Medicare Other | Source: Ambulatory Visit | Attending: Internal Medicine | Admitting: Internal Medicine

## 2012-11-25 DIAGNOSIS — E041 Nontoxic single thyroid nodule: Secondary | ICD-10-CM | POA: Insufficient documentation

## 2012-11-25 DIAGNOSIS — R911 Solitary pulmonary nodule: Secondary | ICD-10-CM | POA: Insufficient documentation

## 2012-11-25 DIAGNOSIS — C3490 Malignant neoplasm of unspecified part of unspecified bronchus or lung: Secondary | ICD-10-CM | POA: Insufficient documentation

## 2012-11-25 DIAGNOSIS — C7889 Secondary malignant neoplasm of other digestive organs: Secondary | ICD-10-CM | POA: Insufficient documentation

## 2012-11-25 DIAGNOSIS — R599 Enlarged lymph nodes, unspecified: Secondary | ICD-10-CM | POA: Insufficient documentation

## 2012-11-25 DIAGNOSIS — Z79899 Other long term (current) drug therapy: Secondary | ICD-10-CM | POA: Insufficient documentation

## 2012-11-25 DIAGNOSIS — Z90710 Acquired absence of both cervix and uterus: Secondary | ICD-10-CM | POA: Insufficient documentation

## 2012-11-25 DIAGNOSIS — N281 Cyst of kidney, acquired: Secondary | ICD-10-CM | POA: Insufficient documentation

## 2012-11-25 DIAGNOSIS — N83209 Unspecified ovarian cyst, unspecified side: Secondary | ICD-10-CM | POA: Insufficient documentation

## 2012-11-25 DIAGNOSIS — E278 Other specified disorders of adrenal gland: Secondary | ICD-10-CM | POA: Insufficient documentation

## 2012-11-25 DIAGNOSIS — M4310 Spondylolisthesis, site unspecified: Secondary | ICD-10-CM | POA: Insufficient documentation

## 2012-11-25 DIAGNOSIS — I700 Atherosclerosis of aorta: Secondary | ICD-10-CM | POA: Insufficient documentation

## 2012-11-25 MED ORDER — IOHEXOL 300 MG/ML  SOLN
100.0000 mL | Freq: Once | INTRAMUSCULAR | Status: AC | PRN
  Administered 2012-11-25: 100 mL via INTRAVENOUS

## 2012-11-26 DIAGNOSIS — C3490 Malignant neoplasm of unspecified part of unspecified bronchus or lung: Secondary | ICD-10-CM

## 2012-11-26 HISTORY — DX: Malignant neoplasm of unspecified part of unspecified bronchus or lung: C34.90

## 2012-11-28 ENCOUNTER — Other Ambulatory Visit (HOSPITAL_BASED_OUTPATIENT_CLINIC_OR_DEPARTMENT_OTHER): Payer: Medicare Other | Admitting: Lab

## 2012-11-28 ENCOUNTER — Other Ambulatory Visit: Payer: Medicare Other | Admitting: Lab

## 2012-11-28 ENCOUNTER — Ambulatory Visit (HOSPITAL_BASED_OUTPATIENT_CLINIC_OR_DEPARTMENT_OTHER): Payer: Medicare Other | Admitting: Internal Medicine

## 2012-11-28 ENCOUNTER — Encounter: Payer: Self-pay | Admitting: *Deleted

## 2012-11-28 ENCOUNTER — Encounter (HOSPITAL_COMMUNITY)
Admission: RE | Admit: 2012-11-28 | Discharge: 2012-11-28 | Disposition: A | Discharge status: Home / Self Care | Payer: Medicare Other | Source: Ambulatory Visit | Attending: Internal Medicine | Admitting: Internal Medicine

## 2012-11-28 ENCOUNTER — Ambulatory Visit (HOSPITAL_BASED_OUTPATIENT_CLINIC_OR_DEPARTMENT_OTHER): Payer: Medicare Other

## 2012-11-28 ENCOUNTER — Ambulatory Visit: Payer: Medicare Other | Admitting: Lab

## 2012-11-28 ENCOUNTER — Ambulatory Visit: Payer: Medicare Other | Admitting: Internal Medicine

## 2012-11-28 ENCOUNTER — Other Ambulatory Visit: Payer: Self-pay | Admitting: Medical Oncology

## 2012-11-28 ENCOUNTER — Ambulatory Visit (HOSPITAL_COMMUNITY): Payer: Medicare Other

## 2012-11-28 ENCOUNTER — Encounter: Payer: Self-pay | Admitting: Internal Medicine

## 2012-11-28 VITALS — BP 131/87 | HR 87 | Temp 97.2°F | Resp 18

## 2012-11-28 DIAGNOSIS — D6481 Anemia due to antineoplastic chemotherapy: Secondary | ICD-10-CM

## 2012-11-28 DIAGNOSIS — C3490 Malignant neoplasm of unspecified part of unspecified bronchus or lung: Secondary | ICD-10-CM

## 2012-11-28 DIAGNOSIS — D649 Anemia, unspecified: Secondary | ICD-10-CM

## 2012-11-28 DIAGNOSIS — C7889 Secondary malignant neoplasm of other digestive organs: Secondary | ICD-10-CM

## 2012-11-28 LAB — CBC WITH DIFFERENTIAL/PLATELET
BASO%: 0.1 % (ref 0.0–2.0)
Basophils Absolute: 0 10*3/uL (ref 0.0–0.1)
Eosinophils Absolute: 0 10*3/uL (ref 0.0–0.5)
HCT: 28.1 % — ABNORMAL LOW (ref 34.8–46.6)
HGB: 8.7 g/dL — ABNORMAL LOW (ref 11.6–15.9)
LYMPH%: 7 % — ABNORMAL LOW (ref 14.0–49.7)
MONO#: 0.4 10*3/uL (ref 0.1–0.9)
NEUT#: 11.8 10*3/uL — ABNORMAL HIGH (ref 1.5–6.5)
NEUT%: 89.8 % — ABNORMAL HIGH (ref 38.4–76.8)
Platelets: 355 10*3/uL (ref 145–400)
WBC: 13.2 10*3/uL — ABNORMAL HIGH (ref 3.9–10.3)
lymph#: 0.9 10*3/uL (ref 0.9–3.3)

## 2012-11-28 LAB — COMPREHENSIVE METABOLIC PANEL (CC13)
AST: 46 U/L — ABNORMAL HIGH (ref 5–34)
BUN: 29.7 mg/dL — ABNORMAL HIGH (ref 7.0–26.0)
CO2: 23 mEq/L (ref 22–29)
Chloride: 109 mEq/L (ref 98–109)
Creatinine: 1.1 mg/dL (ref 0.6–1.1)
Potassium: 4.6 mEq/L (ref 3.5–5.1)

## 2012-11-28 MED ORDER — ACETAMINOPHEN 325 MG PO TABS
650.0000 mg | ORAL_TABLET | Freq: Once | ORAL | Status: AC
  Administered 2012-11-28: 650 mg via ORAL

## 2012-11-28 MED ORDER — SODIUM CHLORIDE 0.9 % IV SOLN
250.0000 mL | Freq: Once | INTRAVENOUS | Status: AC
  Administered 2012-11-28: 250 mL via INTRAVENOUS

## 2012-11-28 MED ORDER — DIPHENHYDRAMINE HCL 50 MG/ML IJ SOLN
25.0000 mg | Freq: Once | INTRAMUSCULAR | Status: AC
  Administered 2012-11-28: 25 mg via INTRAVENOUS

## 2012-11-28 NOTE — Patient Instructions (Signed)
Blood Transfusion Information WHAT IS A BLOOD TRANSFUSION? A transfusion is the replacement of blood or some of its parts. Blood is made up of multiple cells which provide different functions.  Red blood cells carry oxygen and are used for blood loss replacement.  White blood cells fight against infection.  Platelets control bleeding.  Plasma helps clot blood.  Other blood products are available for specialized needs, such as hemophilia or other clotting disorders. BEFORE THE TRANSFUSION  Who gives blood for transfusions?   You may be able to donate blood to be used at a later date on yourself (autologous donation).  Relatives can be asked to donate blood. This is generally not any safer than if you have received blood from a stranger. The same precautions are taken to ensure safety when a relative's blood is donated.  Healthy volunteers who are fully evaluated to make sure their blood is safe. This is blood bank blood. Transfusion therapy is the safest it has ever been in the practice of medicine. Before blood is taken from a donor, a complete history is taken to make sure that person has no history of diseases nor engages in risky social behavior (examples are intravenous drug use or sexual activity with multiple partners). The donor's travel history is screened to minimize risk of transmitting infections, such as malaria. The donated blood is tested for signs of infectious diseases, such as HIV and hepatitis. The blood is then tested to be sure it is compatible with you in order to minimize the chance of a transfusion reaction. If you or a relative donates blood, this is often done in anticipation of surgery and is not appropriate for emergency situations. It takes many days to process the donated blood. RISKS AND COMPLICATIONS Although transfusion therapy is very safe and saves many lives, the main dangers of transfusion include:   Getting an infectious disease.  Developing a  transfusion reaction. This is an allergic reaction to something in the blood you were given. Every precaution is taken to prevent this. The decision to have a blood transfusion has been considered carefully by your caregiver before blood is given. Blood is not given unless the benefits outweigh the risks. AFTER THE TRANSFUSION  Right after receiving a blood transfusion, you will usually feel much better and more energetic. This is especially true if your red blood cells have gotten low (anemic). The transfusion raises the level of the red blood cells which carry oxygen, and this usually causes an energy increase.  The nurse administering the transfusion will monitor you carefully for complications. HOME CARE INSTRUCTIONS  No special instructions are needed after a transfusion. You may find your energy is better. Speak with your caregiver about any limitations on activity for underlying diseases you may have. SEEK MEDICAL CARE IF:   Your condition is not improving after your transfusion.  You develop redness or irritation at the intravenous (IV) site. SEEK IMMEDIATE MEDICAL CARE IF:  Any of the following symptoms occur over the next 12 hours:  Shaking chills.  You have a temperature by mouth above 102 F (38.9 C), not controlled by medicine.  Chest, back, or muscle pain.  People around you feel you are not acting correctly or are confused.  Shortness of breath or difficulty breathing.  Dizziness and fainting.  You get a rash or develop hives.  You have a decrease in urine output.  Your urine turns a dark color or changes to pink, red, or brown. Any of the following   symptoms occur over the next 10 days:  You have a temperature by mouth above 102 F (38.9 C), not controlled by medicine.  Shortness of breath.  Weakness after normal activity.  The white part of the eye turns yellow (jaundice).  You have a decrease in the amount of urine or are urinating less often.  Your  urine turns a dark color or changes to pink, red, or brown. Document Released: 04/03/2000 Document Revised: 06/29/2011 Document Reviewed: 11/21/2007 [employer] Patient Information 2014 [employer].  

## 2012-11-28 NOTE — Patient Instructions (Signed)
Your scan showed evidence for disease progression. We will discontinue treatment with docetaxel. I will recheck your eligibility for the immunotherapy trial.

## 2012-11-28 NOTE — Progress Notes (Signed)
11/28/12 BMS 209118 Observation study [name] is in the cancer center today to see Dr. Arbutus Ped before receiving chemotherapy. She is accompanied by her daughter. PROs were completed by her before her MD appointment.  Research labs ordered for today but she did not have phlebotomy. She just had a fingerstick for her labs before the MD appointment.

## 2012-11-28 NOTE — Progress Notes (Signed)
HAR done

## 2012-11-28 NOTE — Progress Notes (Signed)
[facility] Telephone:([phone]   [phone]  OFFICE PROGRESS NOTE  [name], MD [address] [place] [address]  DIAGNOSIS AND STAGE: Metastatic non-small cell lung cancer, adenocarcinoma with negative EGFR mutation and negative ALK gene translocation diagnosed in August of 2013   PRIOR THERAPY:  1) Systemic chemotherapy with carboplatin for AUC of 5 and Alimta 500 mg/M2 every 3 weeks. The patient is status post 6 cycles.  2) Maintenance chemotherapy with Alimta at 500 mg/M2 every 3 weeks. Status post 3 cycles discontinued today secondary to disease progression.  3) Tarceva 150 mg by mouth daily, status post 2 months of therapy. Therapy beginning 3/22/20114.  4) Docetaxel 75 mg/M2 every 3 weeks with Neulasta support, first dose was given on 09/26/2012. S/p 3 cycles, discontinued today secondary to disease progression.   CURRENT THERAPY: None.  CHEMOTHERAPY INTENT: Palliative  CURRENT # OF CHEMOTHERAPY CYCLES: 3  CURRENT ANTIEMETICS: Zofran, dexamethasone and Compazine  CURRENT SMOKING STATUS: Quit smoking  ORAL CHEMOTHERAPY AND CONSENT: None  CURRENT BISPHOSPHONATES USE: None  PAIN MANAGEMENT: 3/10  NARCOTICS INDUCED CONSTIPATION: None  LIVING WILL AND CODE STATUS: No code Blue.   INTERVAL HISTORY: [name] 79 y.o. female returns to the clinic today for follow up visit accompanied by her daughter. The patient is feeling better today except for mild fatigue and shortness breath with exertion. She denied having any significant chest pain, cough or hemoptysis.she has no significant weight loss or night sweats. The patient tolerated the last cycle of her systemic chemotherapy with single agent docetaxel fairly well. She had repeat CT scan of the chest, abdomen and pelvis performed recently and she is here for evaluation and discussion of her scan results.  MEDICAL HISTORY: Past Medical History  Diagnosis Date  .  Cervical spondylosis   . Renal cyst     simple bilateral 2008  . Diverticulosis   . Vertigo, benign positional   . Hyperlipidemia   . Aortic sclerosis     by exam,  . Erosive osteoarthritis of hand   . History of calcium pyrophosphate deposition disease (CPPD)   . Gout     vs pseudo gout flares in past  . Mediastinal adenopathy   . Tobacco use   . COPD, mild   . Glaucoma   . Hypertension   . Osteopenia   . Hiatal hernia   . Hemorrhoids   . H/O urinary incontinence   . Childhood asthma   . Bronchitis   . TIA (transient ischemic attack)   . Claustrophobia   . Dizziness   . Bleeding from the nose     hx of severe nose bleeds-last time was 2 yrs ago, slight bleed last week  . lung ca 11/2011    ALLERGIES:  is allergic to shellfish allergy; septra ds; statins; tape; wellbutrin; and taxotere.  MEDICATIONS:  Current Outpatient Prescriptions  Medication Sig Dispense Refill  . ADVAIR DISKUS 250-50 MCG/DOSE AEPB Inhale 1 puff into the lungs daily as needed (shortness of breath).       Marland Kitchen allopurinol (ZYLOPRIM) 100 MG tablet Take 100 mg by mouth every morning.       Marland Kitchen ALPRAZolam (XANAX) 0.25 MG tablet 0.25 mg 3 (three) times daily as needed (nausea).       . Alum & Mag Hydroxide-Simeth (MAGIC MOUTHWASH) SOLN Take 5 mLs by mouth 4 (four) times daily as needed (mouth sores).      Marland Kitchen aspirin 81  MG tablet Take 81 mg by mouth every evening.       . B Complex-C (B-COMPLEX WITH VITAMIN C) tablet Take 1 tablet by mouth every morning.       . Cholecalciferol (VITAMIN D) 2000 UNITS tablet Take 2,000 Units by mouth every evening.       . diltiazem 2 % GEL Apply 1 application topically 2 (two) times daily as needed (hemmorrhoids).       . estrogens, conjugated, (PREMARIN) 0.625 MG tablet Take 0.625 mg by mouth every morning.       . fish oil-omega-3 fatty acids 1000 MG capsule Take 1 g by mouth 2 (two) times daily.       Marland Kitchen ipratropium (ATROVENT) 0.06 % nasal spray Place 2 sprays into the nose 4  (four) times daily as needed.       Marland Kitchen LORazepam (ATIVAN) 0.5 MG tablet Take 1 tablet (0.5 mg total) by mouth every 8 (eight) hours as needed for anxiety.  30 tablet  0  . Multiple Vitamin (MULTIVITAMIN WITH MINERALS) TABS Take 1 tablet by mouth every morning.       . nebivolol (BYSTOLIC) 5 MG tablet Take 5 mg by mouth every evening.       . ondansetron (ZOFRAN-ODT) 8 MG disintegrating tablet TAKE 1 TABLET UNDER TONGUE EVERY 6 TO 8 HOURS AS NEEDED FOR NAUSEA/VOMITING  30 tablet  1  . oxyCODONE-acetaminophen (PERCOCET/ROXICET) 5-325 MG per tablet Take 1 tablet by mouth every 6 (six) hours as needed for pain.  30 tablet  0  . timolol (TIMOPTIC-XR) 0.5 % ophthalmic gel-forming Place 1 drop into the left eye 2 (two) times daily.        No current facility-administered medications for this visit.    SURGICAL HISTORY:  Past Surgical History  Procedure Laterality Date  . Cystoscopy, transurethral collagen injection therapy    . Left total knee arthroplasty    . Appendectomy    . Hemorrhoid surgery    . Cataract surgery x 2    . Eye surgery    . Tonsillectomy    . Abdominal hysterectomy    . Colonoscopy      REVIEW OF SYSTEMS:  A comprehensive review of systems was negative except for: Constitutional: positive for fatigue Respiratory: positive for dyspnea on exertion   PHYSICAL EXAMINATION: General appearance: alert, cooperative, fatigued and no distress Head: Normocephalic, without obvious abnormality, atraumatic Neck: no adenopathy Lymph nodes: Cervical, supraclavicular, and axillary nodes normal. Resp: clear to auscultation bilaterally Back: symmetric, no curvature. ROM normal. No CVA tenderness. Cardio: regular rate and rhythm, S1, S2 normal, no murmur, click, rub or gallop GI: soft, non-tender; bowel sounds normal; no masses,  no organomegaly Extremities: extremities normal, atraumatic, no cyanosis or edema Neurologic: Alert and oriented X 3, normal strength and tone. Normal  symmetric reflexes. Normal coordination and gait  ECOG PERFORMANCE STATUS: 1 - Symptomatic but completely ambulatory  Blood pressure 159/68, pulse 89, temperature 97.3 F (36.3 C), temperature source Oral, resp. rate 18, height 5\' 1"  (1.549 m), weight 131 lb 3.2 oz (59.512 kg), SpO2 96.00%.  LABORATORY DATA: Lab Results  Component Value Date   WBC 13.2* 11/28/2012   HGB 8.7* 11/28/2012   HCT 28.1* 11/28/2012   MCV 103.7* 11/28/2012   PLT 355 11/28/2012      Chemistry      Component Value Date/Time   NA 142 11/21/2012 0815   NA 137 10/24/2012 1808   K 4.0 11/21/2012 0815   K  3.7 10/24/2012 1808   CL 102 10/24/2012 1808   CL 107 09/26/2012 1247   CO2 23 11/21/2012 0815   CO2 23 10/24/2012 1808   BUN 22.7 11/21/2012 0815   BUN 25* 10/24/2012 1808   CREATININE 0.9 11/21/2012 0815   CREATININE 1.17* 10/24/2012 1808      Component Value Date/Time   CALCIUM 9.1 11/21/2012 0815   CALCIUM 9.0 10/24/2012 1808   ALKPHOS 199* 11/21/2012 0815   ALKPHOS 155* 10/24/2012 1808   AST 42* 11/21/2012 0815   AST 46* 10/24/2012 1808   ALT 16 11/21/2012 0815   ALT 33 10/24/2012 1808   BILITOT 0.32 11/21/2012 0815   BILITOT 0.2* 10/24/2012 1808       RADIOGRAPHIC STUDIES: Dg Chest 2 View  11/23/2012   *RADIOLOGY REPORT*  Clinical Data: Previously documented lung carcinoma presenting with increase shortness of breath and cough  CHEST - 2 VIEW  Comparison:  October 24, 2012  Findings: There is again noted a mass arising in the right middle lobe, currently measuring 10.2 x 6.7 x 8.8 cm.  Elsewhere, the lungs clear.  The heart size and pulmonary vascularity normal.  No adenopathy.  No bone lesions.  IMPRESSION: Large mass arising from the right middle lobe again noted.  No consolidation.  No new opacity.   Original Report Authenticated By: Bretta Bang, M.D.   Ct Chest W Contrast  11/25/2012   *RADIOLOGY REPORT*  Clinical Data:  Lung cancer diagnosed 11/2011, chemotherapy ongoing  CT CHEST, ABDOMEN AND PELVIS WITH CONTRAST  Technique:   Multidetector CT imaging of the chest, abdomen and pelvis was performed following the standard protocol during bolus administration of intravenous contrast.  Contrast: OMNIPAQUE IOHEXOL 300 MG/ML  SOLN  Comparison:  09/02/2012    CT CHEST  Findings:  Posterior right middle lobe mass measures 8.3 x 6.4 cm (series 5/image 32), previously 6.3 x 5.5 cm.  Adjacent 1.7 x 1.5 cm right middle lobe nodule (series 5/image 34), grossly unchanged.  Scattered 3-5 mm nodules in the lateral left upper lobe (series 5/image 16), anterior left upper lobe (series 5/images 24 and 34), and right middle lobe (series 5/image 37), new.  Mild paraseptal emphysematous changes. No pleural effusion or pneumothorax.  Visualized left thyroid is notable for a 1.6 x 1.3 cm left thyroid nodule, grossly unchanged.  The heart is top normal in size.  No pericardial effusion.  Head and of the aortic arch.  Thoracic lymphadenopathy, including: --1.6 cm short-axis left supraclavicular node (series 2/image 6), unchanged --2.9 cm short-axis high right paratracheal node (series 2/image 18), previously 3.2 cm --2.4 cm short-axis subcarinal node (series 2/image 28), unchanged --11 mm short-axis left hilar node (series 2/image 28), previously 8 mm --13 mm short-axis right hilar node (series 2/image 31), previously 10 mm  Degenerative changes of the thoracic spine.  IMPRESSION: 8.3 cm right middle lobe mass, increased.  Adjacent 1.7 cm right middle lobe nodule, unchanged.  Scattered 3-5 mm nodules in the left upper lobe and right middle lobe, suspicious for additional pulmonary metastases, new.  Thoracic lymphadenopathy, as described above, mildly progressed in the bilateral hilar regions.    CT ABDOMEN AND PELVIS  Findings:  Liver, pancreas, and left adrenal gland are within normal limits.  3.2 x 3.5 cm inferior splenic metastasis (series 2/image 63), new.  8 mm right adrenal nodule (series 2/56), unchanged.  Gallbladder is unremarkable.  No  intrahepatic or extrahepatic ductal dilatation.  Complex/hemorrhagic posterior interpolar right renal cyst now measures 2.5  x 2.0 cm, previously 2.9 x 2.0 cm.  Additional subcentimeter anterior interpolar right renal cysts (series 2/image 55).  Left kidney is within normal limits.  No hydronephrosis.  No evidence of bowel obstruction.  Atherosclerotic calcifications of the abdominal aorta and branch vessels.  No abdominopelvic ascites.  No suspicious abdominopelvic lymphadenopathy.  Status post hysterectomy.  Stable 1.7 cm left ovarian cyst.  The right ovary is unremarkable.  Bladder is underdistended.  Degenerative changes of the lumbar spine with grade 1 anterolisthesis of L5 on S1.  IMPRESSION: 3.5 cm splenic metastasis, new.  8 mm right adrenal nodule, hypermetabolic on prior PET, although unchanged.   Original Report Authenticated By: Charline Bills, M.D.   ASSESSMENT AND PLAN: this is a very pleasant 79 years old white female with metastatic non-small cell lung cancer most recently treated with 3 cycles of systemic chemotherapy with single agent docetaxel but unfortunately her recent scan showed evidence for disease progression with enlargement of the right lung mass in addition to new splenic metastasis. I have a lengthy discussion with the patient and her daughter today about her current condition. I would discontinue her current treatment with docetaxel. I would consider the patient for the BMS clinical trial with anti PD-1, Nivolumab. The patient has mild fatigue secondary to chemotherapy-induced anemia and I would consider her for one unit of PRBCs transfusion. I will arrange a follow up visit for the patient after we check her eligibility for the immunotherapy trial. The patient and her daughter agreed to the current plan. She was advised to call immediately if she has any concerning symptoms in the interval.  The patient voices understanding of current disease status and treatment options  and is in agreement with the current care plan.  All questions were answered. The patient knows to call the clinic with any problems, questions or concerns. We can certainly see the patient much sooner if necessary.  I spent 15 minutes counseling the patient face to face. The total time spent in the appointment was 25 minutes.

## 2012-11-29 ENCOUNTER — Ambulatory Visit: Payer: Medicare Other

## 2012-11-29 LAB — TYPE AND SCREEN: ABO/RH(D): O NEG

## 2012-11-30 ENCOUNTER — Telehealth: Payer: Self-pay | Admitting: *Deleted

## 2012-11-30 ENCOUNTER — Other Ambulatory Visit: Payer: Self-pay

## 2012-11-30 ENCOUNTER — Telehealth: Payer: Self-pay | Admitting: Internal Medicine

## 2012-11-30 ENCOUNTER — Other Ambulatory Visit: Payer: Self-pay | Admitting: *Deleted

## 2012-11-30 ENCOUNTER — Encounter: Payer: Medicare Other | Admitting: *Deleted

## 2012-11-30 ENCOUNTER — Ambulatory Visit (HOSPITAL_BASED_OUTPATIENT_CLINIC_OR_DEPARTMENT_OTHER): Payer: Medicare Other | Admitting: Lab

## 2012-11-30 DIAGNOSIS — C3490 Malignant neoplasm of unspecified part of unspecified bronchus or lung: Secondary | ICD-10-CM

## 2012-11-30 LAB — MAGNESIUM (CC13): Magnesium: 1.3 mg/dl — CL (ref 1.5–2.5)

## 2012-11-30 LAB — HEPATITIS B SURFACE ANTIGEN: Hepatitis B Surface Ag: NEGATIVE

## 2012-11-30 LAB — CBC WITH DIFFERENTIAL/PLATELET
Basophils Absolute: 0 10*3/uL (ref 0.0–0.1)
EOS%: 1.5 % (ref 0.0–7.0)
HGB: 10.4 g/dL — ABNORMAL LOW (ref 11.6–15.9)
LYMPH%: 12.9 % — ABNORMAL LOW (ref 14.0–49.7)
MCH: 32.7 pg (ref 25.1–34.0)
MCV: 98.7 fL (ref 79.5–101.0)
MONO%: 9.8 % (ref 0.0–14.0)
NEUT%: 75.4 % (ref 38.4–76.8)
Platelets: 348 10*3/uL (ref 145–400)
RDW: 20.7 % — ABNORMAL HIGH (ref 11.2–14.5)

## 2012-11-30 LAB — TSH: TSH: 2.214 u[IU]/mL (ref 0.350–4.500)

## 2012-11-30 MED ORDER — MAGNESIUM OXIDE 400 (241.3 MG) MG PO TABS: 400.0000 mg | ORAL_TABLET | Freq: Two times a day (BID) | ORAL | Status: DC

## 2012-11-30 NOTE — Telephone Encounter (Signed)
gave pt appt for August 2014 lab and MD, sent pt to labs today emailed Marcelino Duster regarding chemo

## 2012-11-30 NOTE — Telephone Encounter (Signed)
Mag level 1.3, per Dr Donnald Garre, Jodelle Green oxide 400mg  BID.  Pt verbalized understanding.  SLJ

## 2012-11-30 NOTE — Telephone Encounter (Signed)
Per staff message and POF I have scheduled appts.  JMW  

## 2012-12-01 ENCOUNTER — Telehealth: Payer: Self-pay | Admitting: Internal Medicine

## 2012-12-01 ENCOUNTER — Other Ambulatory Visit: Payer: Self-pay | Admitting: *Deleted

## 2012-12-01 ENCOUNTER — Telehealth: Payer: Self-pay | Admitting: *Deleted

## 2012-12-01 NOTE — Telephone Encounter (Signed)
Spoke with [name] to inform her that she will not be eligible for the BMS [id number] clinical trial with Nivolumab. Her Hepatitis C antibody result was reactive.  Per Dr. Asa Lente instructions, She will be scheduled to see him tomorrow monring at 10:00. She verbalized understanding.  Scheduling communication completed and sent to the schedulers. Also sent orders to cancer appointments scheduled next week for research purposes.

## 2012-12-01 NOTE — Telephone Encounter (Signed)
s.w. pt and advised on 8.15.14 appt....ok and aware

## 2012-12-02 ENCOUNTER — Telehealth: Payer: Self-pay | Admitting: Internal Medicine

## 2012-12-02 ENCOUNTER — Encounter: Payer: Self-pay | Admitting: *Deleted

## 2012-12-02 ENCOUNTER — Ambulatory Visit (HOSPITAL_BASED_OUTPATIENT_CLINIC_OR_DEPARTMENT_OTHER): Payer: Medicare Other | Admitting: Lab

## 2012-12-02 ENCOUNTER — Ambulatory Visit (HOSPITAL_BASED_OUTPATIENT_CLINIC_OR_DEPARTMENT_OTHER): Payer: Medicare Other | Admitting: Internal Medicine

## 2012-12-02 DIAGNOSIS — R894 Abnormal immunological findings in specimens from other organs, systems and tissues: Secondary | ICD-10-CM

## 2012-12-02 DIAGNOSIS — B1920 Unspecified viral hepatitis C without hepatic coma: Secondary | ICD-10-CM

## 2012-12-02 DIAGNOSIS — C3490 Malignant neoplasm of unspecified part of unspecified bronchus or lung: Secondary | ICD-10-CM

## 2012-12-02 DIAGNOSIS — R768 Other specified abnormal immunological findings in serum: Secondary | ICD-10-CM

## 2012-12-02 LAB — COMPREHENSIVE METABOLIC PANEL (CC13)
ALT: 31 U/L (ref 0–55)
AST: 76 U/L — ABNORMAL HIGH (ref 5–34)
Albumin: 2.5 g/dL — ABNORMAL LOW (ref 3.5–5.0)
Alkaline Phosphatase: 177 U/L — ABNORMAL HIGH (ref 40–150)
BUN: 26.7 mg/dL — ABNORMAL HIGH (ref 7.0–26.0)
Chloride: 106 mEq/L (ref 98–109)
Potassium: 4.2 mEq/L (ref 3.5–5.1)

## 2012-12-02 NOTE — Progress Notes (Signed)
[facility] [phone]   [phone]  OFFICE PROGRESS NOTE  [name], MD [address] [place] [address]  DIAGNOSIS AND STAGE: Metastatic non-small cell lung cancer, adenocarcinoma with negative EGFR mutation and negative ALK gene translocation diagnosed in August of 2013   PRIOR THERAPY:  1) Systemic chemotherapy with carboplatin for AUC of 5 and Alimta 500 mg/M2 every 3 weeks. The patient is status post 6 cycles.  2) Maintenance chemotherapy with Alimta at 500 mg/M2 every 3 weeks. Status post 3 cycles discontinued today secondary to disease progression.  3) Tarceva 150 mg by mouth daily, status post 2 months of therapy. Therapy beginning 3/22/20114.  4) Docetaxel 75 mg/M2 every 3 weeks with Neulasta support, first dose was given on 09/26/2012. S/p 3 cycles, discontinued today secondary to disease progression.   CURRENT THERAPY: None.   CHEMOTHERAPY INTENT: Palliative  CURRENT # OF CHEMOTHERAPY CYCLES: 3  CURRENT ANTIEMETICS: Zofran, dexamethasone and Compazine  CURRENT SMOKING STATUS: Quit smoking  ORAL CHEMOTHERAPY AND CONSENT: None  CURRENT BISPHOSPHONATES USE: None  PAIN MANAGEMENT: 3/10  NARCOTICS INDUCED CONSTIPATION: None    INTERVAL HISTORY: [name] 79 y.o. female returns to the clinic today for follow up visit accompanied by her daughter.  The patient has recent evidence for disease progression and was considered for clinical trial with immunotherapy with anti-PD-1, Nivolumab but unfortunately screening workup showed that the patient has positive for hepatitis C. Antibody and she was not eligible for the clinical trial. She is here today for evaluation and discussion of other treatment options.  The patient is feeling fine today with no specific complaints except for shortness breath with exertion and mild cough. She denied having any significant weight loss or night sweats. She denied having any  nausea or vomiting. Her performance status is still very good.  MEDICAL HISTORY: Past Medical History  Diagnosis Date  . Cervical spondylosis   . Renal cyst     simple bilateral 2008  . Diverticulosis   . Vertigo, benign positional   . Hyperlipidemia   . Aortic sclerosis     by exam,  . Erosive osteoarthritis of hand   . History of calcium pyrophosphate deposition disease (CPPD)   . Gout     vs pseudo gout flares in past  . Mediastinal adenopathy   . Tobacco use   . COPD, mild   . Glaucoma   . Hypertension   . Osteopenia   . Hiatal hernia   . Hemorrhoids   . H/O urinary incontinence   . Childhood asthma   . Bronchitis   . TIA (transient ischemic attack)   . Claustrophobia   . Dizziness   . Bleeding from the nose     hx of severe nose bleeds-last time was 2 yrs ago, slight bleed last week  . lung ca 11/2011    ALLERGIES:  is allergic to shellfish allergy; septra ds; statins; tape; wellbutrin; and taxotere.  MEDICATIONS:  Current Outpatient Prescriptions  Medication Sig Dispense Refill  . ADVAIR DISKUS 250-50 MCG/DOSE AEPB Inhale 1 puff into the lungs daily as needed (shortness of breath).       Marland Kitchen allopurinol (ZYLOPRIM) 100 MG tablet Take 100 mg by mouth every morning.       Marland Kitchen ALPRAZolam (XANAX) 0.25 MG tablet 0.25 mg 3 (three) times daily as needed (nausea).       . Alum & Mag Hydroxide-Simeth (MAGIC MOUTHWASH) SOLN Take 5 mLs  by mouth 4 (four) times daily as needed (mouth sores).      Marland Kitchen aspirin 81 MG tablet Take 81 mg by mouth every evening.       . B Complex-C (B-COMPLEX WITH VITAMIN C) tablet Take 1 tablet by mouth every morning.       . Cholecalciferol (VITAMIN D) 2000 UNITS tablet Take 2,000 Units by mouth every evening.       . diltiazem 2 % GEL Apply 1 application topically 2 (two) times daily as needed (hemmorrhoids).       . estrogens, conjugated, (PREMARIN) 0.625 MG tablet Take 0.625 mg by mouth every morning.       . fish oil-omega-3 fatty acids 1000 MG  capsule Take 1 g by mouth 2 (two) times daily.       Marland Kitchen ipratropium (ATROVENT) 0.06 % nasal spray Place 2 sprays into the nose 4 (four) times daily as needed.       Marland Kitchen LORazepam (ATIVAN) 0.5 MG tablet Take 1 tablet (0.5 mg total) by mouth every 8 (eight) hours as needed for anxiety.  30 tablet  0  . magnesium oxide (MAG-OX) 400 (241.3 MG) MG tablet Take 1 tablet (400 mg total) by mouth 2 (two) times daily.  60 tablet  2  . Multiple Vitamin (MULTIVITAMIN WITH MINERALS) TABS Take 1 tablet by mouth every morning.       . nebivolol (BYSTOLIC) 5 MG tablet Take 5 mg by mouth every evening.       . ondansetron (ZOFRAN-ODT) 8 MG disintegrating tablet TAKE 1 TABLET UNDER TONGUE EVERY 6 TO 8 HOURS AS NEEDED FOR NAUSEA/VOMITING  30 tablet  1  . oxyCODONE-acetaminophen (PERCOCET/ROXICET) 5-325 MG per tablet Take 1 tablet by mouth every 6 (six) hours as needed for pain.  30 tablet  0  . timolol (TIMOPTIC-XR) 0.5 % ophthalmic gel-forming Place 1 drop into the left eye 2 (two) times daily.        No current facility-administered medications for this visit.    SURGICAL HISTORY:  Past Surgical History  Procedure Laterality Date  . Cystoscopy, transurethral collagen injection therapy    . Left total knee arthroplasty    . Appendectomy    . Hemorrhoid surgery    . Cataract surgery x 2    . Eye surgery    . Tonsillectomy    . Abdominal hysterectomy    . Colonoscopy      REVIEW OF SYSTEMS:  A comprehensive review of systems was negative except for: Constitutional: positive for fatigue Respiratory: positive for dyspnea on exertion   PHYSICAL EXAMINATION: General appearance: alert, cooperative, fatigued and no distress Head: Normocephalic, without obvious abnormality, atraumatic Neck: no adenopathy Lymph nodes: Cervical, supraclavicular, and axillary nodes normal. Resp: clear to auscultation bilaterally Cardio: regular rate and rhythm, S1, S2 normal, no murmur, click, rub or gallop GI: soft, non-tender;  bowel sounds normal; no masses,  no organomegaly Extremities: extremities normal, atraumatic, no cyanosis or edema Neurologic: Alert and oriented X 3, normal strength and tone. Normal symmetric reflexes. Normal coordination and gait  ECOG PERFORMANCE STATUS: 1 - Symptomatic but completely ambulatory  Blood pressure 126/61, pulse 86, temperature 98 F (36.7 C), temperature source Oral, resp. rate 18, height 5\' 1"  (1.549 m), weight 128 lb 8 oz (58.287 kg), SpO2 96.00%.  LABORATORY DATA: Lab Results  Component Value Date   WBC 10.0 11/30/2012   HGB 10.4* 11/30/2012   HCT 31.5* 11/30/2012   MCV 98.7 11/30/2012   PLT 348  11/30/2012      Chemistry      Component Value Date/Time   NA 142 11/28/2012 0946   NA 137 10/24/2012 1808   K 4.6 11/28/2012 0946   K 3.7 10/24/2012 1808   CL 102 10/24/2012 1808   CL 107 09/26/2012 1247   CO2 23 11/28/2012 0946   CO2 23 10/24/2012 1808   BUN 29.7* 11/28/2012 0946   BUN 25* 10/24/2012 1808   CREATININE 1.1 11/28/2012 0946   CREATININE 1.17* 10/24/2012 1808      Component Value Date/Time   CALCIUM 9.4 11/28/2012 0946   CALCIUM 9.0 10/24/2012 1808   ALKPHOS 190* 11/28/2012 0946   ALKPHOS 155* 10/24/2012 1808   AST 46* 11/28/2012 0946   AST 46* 10/24/2012 1808   ALT 19 11/28/2012 0946   ALT 33 10/24/2012 1808   BILITOT 0.26 11/28/2012 0946   BILITOT 0.2* 10/24/2012 1808       RADIOGRAPHIC STUDIES: Dg Chest 2 View  11/23/2012   *RADIOLOGY REPORT*  Clinical Data: Previously documented lung carcinoma presenting with increase shortness of breath and cough  CHEST - 2 VIEW  Comparison:  October 24, 2012  Findings: There is again noted a mass arising in the right middle lobe, currently measuring 10.2 x 6.7 x 8.8 cm.  Elsewhere, the lungs clear.  The heart size and pulmonary vascularity normal.  No adenopathy.  No bone lesions.  IMPRESSION: Large mass arising from the right middle lobe again noted.  No consolidation.  No new opacity.   Original Report Authenticated By: Bretta Bang,  M.D.   Ct Chest W Contrast  11/25/2012   *RADIOLOGY REPORT*  Clinical Data:  Lung cancer diagnosed 11/2011, chemotherapy ongoing  CT CHEST, ABDOMEN AND PELVIS WITH CONTRAST  Technique:  Multidetector CT imaging of the chest, abdomen and pelvis was performed following the standard protocol during bolus administration of intravenous contrast.  Contrast: OMNIPAQUE IOHEXOL 300 MG/ML  SOLN  Comparison:  09/02/2012  CT CHEST  Findings:  Posterior right middle lobe mass measures 8.3 x 6.4 cm (series 5/image 32), previously 6.3 x 5.5 cm.  Adjacent 1.7 x 1.5 cm right middle lobe nodule (series 5/image 34), grossly unchanged.  Scattered 3-5 mm nodules in the lateral left upper lobe (series 5/image 16), anterior left upper lobe (series 5/images 24 and 34), and right middle lobe (series 5/image 37), new.  Mild paraseptal emphysematous changes. No pleural effusion or pneumothorax.  Visualized left thyroid is notable for a 1.6 x 1.3 cm left thyroid nodule, grossly unchanged.  The heart is top normal in size.  No pericardial effusion.  Head and of the aortic arch.  Thoracic lymphadenopathy, including: --1.6 cm short-axis left supraclavicular node (series 2/image 6), unchanged --2.9 cm short-axis high right paratracheal node (series 2/image 18), previously 3.2 cm --2.4 cm short-axis subcarinal node (series 2/image 28), unchanged --11 mm short-axis left hilar node (series 2/image 28), previously 8 mm --13 mm short-axis right hilar node (series 2/image 31), previously 10 mm  Degenerative changes of the thoracic spine.  IMPRESSION: 8.3 cm right middle lobe mass, increased.  Adjacent 1.7 cm right middle lobe nodule, unchanged.  Scattered 3-5 mm nodules in the left upper lobe and right middle lobe, suspicious for additional pulmonary metastases, new.  Thoracic lymphadenopathy, as described above, mildly progressed in the bilateral hilar regions.  CT ABDOMEN AND PELVIS  Findings:  Liver, pancreas, and left adrenal gland are  within normal limits.  3.2 x 3.5 cm inferior splenic metastasis (series 2/image  63), new.  8 mm right adrenal nodule (series 2/56), unchanged.  Gallbladder is unremarkable.  No intrahepatic or extrahepatic ductal dilatation.  Complex/hemorrhagic posterior interpolar right renal cyst now measures 2.5 x 2.0 cm, previously 2.9 x 2.0 cm.  Additional subcentimeter anterior interpolar right renal cysts (series 2/image 55).  Left kidney is within normal limits.  No hydronephrosis.  No evidence of bowel obstruction.  Atherosclerotic calcifications of the abdominal aorta and branch vessels.  No abdominopelvic ascites.  No suspicious abdominopelvic lymphadenopathy.  Status post hysterectomy.  Stable 1.7 cm left ovarian cyst.  The right ovary is unremarkable.  Bladder is underdistended.  Degenerative changes of the lumbar spine with grade 1 anterolisthesis of L5 on S1.  IMPRESSION: 3.5 cm splenic metastasis, new.  8 mm right adrenal nodule, hypermetabolic on prior PET, although unchanged.   Original Report Authenticated By: Charline Bills, M.D.   Ct Abdomen Pelvis W Contrast  11/25/2012   *RADIOLOGY REPORT*  Clinical Data:  Lung cancer diagnosed 11/2011, chemotherapy ongoing  CT CHEST, ABDOMEN AND PELVIS WITH CONTRAST  Technique:  Multidetector CT imaging of the chest, abdomen and pelvis was performed following the standard protocol during bolus administration of intravenous contrast.  Contrast: OMNIPAQUE IOHEXOL 300 MG/ML  SOLN  Comparison:  09/02/2012  CT CHEST  Findings:  Posterior right middle lobe mass measures 8.3 x 6.4 cm (series 5/image 32), previously 6.3 x 5.5 cm.  Adjacent 1.7 x 1.5 cm right middle lobe nodule (series 5/image 34), grossly unchanged.  Scattered 3-5 mm nodules in the lateral left upper lobe (series 5/image 16), anterior left upper lobe (series 5/images 24 and 34), and right middle lobe (series 5/image 37), new.  Mild paraseptal emphysematous changes. No pleural effusion or pneumothorax.   Visualized left thyroid is notable for a 1.6 x 1.3 cm left thyroid nodule, grossly unchanged.  The heart is top normal in size.  No pericardial effusion.  Head and of the aortic arch.  Thoracic lymphadenopathy, including: --1.6 cm short-axis left supraclavicular node (series 2/image 6), unchanged --2.9 cm short-axis high right paratracheal node (series 2/image 18), previously 3.2 cm --2.4 cm short-axis subcarinal node (series 2/image 28), unchanged --11 mm short-axis left hilar node (series 2/image 28), previously 8 mm --13 mm short-axis right hilar node (series 2/image 31), previously 10 mm  Degenerative changes of the thoracic spine.  IMPRESSION: 8.3 cm right middle lobe mass, increased.  Adjacent 1.7 cm right middle lobe nodule, unchanged.  Scattered 3-5 mm nodules in the left upper lobe and right middle lobe, suspicious for additional pulmonary metastases, new.  Thoracic lymphadenopathy, as described above, mildly progressed in the bilateral hilar regions.  CT ABDOMEN AND PELVIS  Findings:  Liver, pancreas, and left adrenal gland are within normal limits.  3.2 x 3.5 cm inferior splenic metastasis (series 2/image 63), new.  8 mm right adrenal nodule (series 2/56), unchanged.  Gallbladder is unremarkable.  No intrahepatic or extrahepatic ductal dilatation.  Complex/hemorrhagic posterior interpolar right renal cyst now measures 2.5 x 2.0 cm, previously 2.9 x 2.0 cm.  Additional subcentimeter anterior interpolar right renal cysts (series 2/image 55).  Left kidney is within normal limits.  No hydronephrosis.  No evidence of bowel obstruction.  Atherosclerotic calcifications of the abdominal aorta and branch vessels.  No abdominopelvic ascites.  No suspicious abdominopelvic lymphadenopathy.  Status post hysterectomy.  Stable 1.7 cm left ovarian cyst.  The right ovary is unremarkable.  Bladder is underdistended.  Degenerative changes of the lumbar spine with grade 1 anterolisthesis of  L5 on S1.  IMPRESSION: 3.5 cm  splenic metastasis, new.  8 mm right adrenal nodule, hypermetabolic on prior PET, although unchanged.   Original Report Authenticated By: Charline Bills, M.D.    ASSESSMENT AND PLAN: this is a very pleasant 79 years old white female with metastatic non-small cell lung cancer, adenocarcinoma status post several chemotherapy regimens including carboplatin and Alimta as well as single agent docetaxel. The patient has evidence for disease progression especially in the right lung mass and mild disease progression in the mediastinum as well as new lesion in the spleen. She has not received any radiotherapy in the past. I recommended for the patient to see a radiation oncology for consideration of palliative radiotherapy to the enlarging right lung mass as well as mediastinal lymph nodes. In the meantime I will send her tissue to be tested for molecular studies by [facility] to see if the patient has any other actionable mutation. For the abnormal lab test with anti-hepatitis C. Antibody, I will order hepatitis C RNA test to evaluate this further. I would see the patient back for follow up visit in 2 months with repeat CT scan of the chest, abdomen and pelvis for restaging of her disease before consideration of any further systemic chemotherapy.  The patient voices understanding of current disease status and treatment options and is in agreement with the current care plan.  All questions were answered. The patient knows to call the clinic with any problems, questions or concerns. We can certainly see the patient much sooner if necessary.  I spent 20 minutes counseling the patient face to face. The total time spent in the appointment was 30 minutes.

## 2012-12-02 NOTE — Progress Notes (Signed)
Faxed [facility] request to [facility] pathology dept per Dr. Asa Lente request. Confirmation fax recieved

## 2012-12-02 NOTE — Telephone Encounter (Signed)
gave pt appt for loab and MD, gave pt oral contrast for CT on October, emailed Md regarding appt with Dr. Dayton Scrape on 8/28

## 2012-12-03 ENCOUNTER — Encounter: Payer: Self-pay | Admitting: Internal Medicine

## 2012-12-03 NOTE — Patient Instructions (Signed)
CURRENT THERAPY: None.   CHEMOTHERAPY INTENT: Palliative  CURRENT # OF CHEMOTHERAPY CYCLES: 3  CURRENT ANTIEMETICS: Zofran, dexamethasone and Compazine  CURRENT SMOKING STATUS: Quit smoking  ORAL CHEMOTHERAPY AND CONSENT: None  CURRENT BISPHOSPHONATES USE: None  PAIN MANAGEMENT: 3/10  NARCOTICS INDUCED CONSTIPATION: None

## 2012-12-05 ENCOUNTER — Other Ambulatory Visit: Payer: Medicare Other | Admitting: Lab

## 2012-12-05 ENCOUNTER — Encounter: Payer: Self-pay | Admitting: Radiation Oncology

## 2012-12-05 NOTE — Progress Notes (Signed)
Thoracic Location of Tumor / Histology: Lung ,Right middle lobe ,hx Metastatic  Non-small cell  Lung Cancer  dx 11/2011 Patient presentedmonths ago with symptoms [id number] shortness breath, and cough  Biopsies of right lung mass 11/27/11 =Non-small cell ca,  On 8/6 /14  New large mass arising from the right middle lobe again (10.2x6.7x8.8cm) Tobacco/Marijuana/Snuff/ETOH use: quit smoking    Past/Anticipated interventions by cardiothoracic surgery, if any:   Past/Anticipated interventions by medical oncology, if any: systemic chemotherapy carboplatin and alimta every 3 weeks.s/p 6 cycles, maintenance chemotherapy with Almita every 3 weeks, s/p 3 cycles,discontinued 11/18/12 to disease progression.,Tarceva 150 mg daily s/p 2 months of therapy.,started 07/09/12 Docetaxel every 3 weeks with Neulasta support,1st dose given 09/26/12, no current therapy  Now, palliative  Signs/Symptoms  Weight changes, if any:no  Respiratory complaints, if any: shortness breath,  Mild fatigue,  Hemoptysis, if any: Pain issues, if any:   SAFETY ISSUES:  Prior radiation?No  Pacemaker/ICD? NO  Possible current pregnancy? No  Is the patient on methotrexate? NO  Current Complaints / other details:  3.5cm splenic metastasis,new  8 mm right adrenal nodule,hypermetobolic on prior PET posterior right middle lobe mass measures 8.3x6.4cm,   (11/25/12 Ct chest,)

## 2012-12-06 ENCOUNTER — Ambulatory Visit: Payer: Medicare Other | Admitting: Internal Medicine

## 2012-12-06 ENCOUNTER — Encounter: Payer: Self-pay | Admitting: Radiation Oncology

## 2012-12-06 ENCOUNTER — Ambulatory Visit
Admission: RE | Admit: 2012-12-06 | Discharge: 2012-12-06 | Disposition: A | Discharge status: Home / Self Care | Payer: Medicare Other | Source: Ambulatory Visit | Attending: Radiation Oncology | Admitting: Radiation Oncology

## 2012-12-06 DIAGNOSIS — R918 Other nonspecific abnormal finding of lung field: Secondary | ICD-10-CM | POA: Insufficient documentation

## 2012-12-06 DIAGNOSIS — R590 Localized enlarged lymph nodes: Secondary | ICD-10-CM

## 2012-12-06 DIAGNOSIS — J449 Chronic obstructive pulmonary disease, unspecified: Secondary | ICD-10-CM | POA: Insufficient documentation

## 2012-12-06 DIAGNOSIS — E785 Hyperlipidemia, unspecified: Secondary | ICD-10-CM | POA: Insufficient documentation

## 2012-12-06 DIAGNOSIS — Z87891 Personal history of nicotine dependence: Secondary | ICD-10-CM | POA: Insufficient documentation

## 2012-12-06 DIAGNOSIS — H409 Unspecified glaucoma: Secondary | ICD-10-CM | POA: Insufficient documentation

## 2012-12-06 DIAGNOSIS — R599 Enlarged lymph nodes, unspecified: Secondary | ICD-10-CM | POA: Insufficient documentation

## 2012-12-06 DIAGNOSIS — Z9221 Personal history of antineoplastic chemotherapy: Secondary | ICD-10-CM | POA: Insufficient documentation

## 2012-12-06 DIAGNOSIS — Z79899 Other long term (current) drug therapy: Secondary | ICD-10-CM | POA: Insufficient documentation

## 2012-12-06 DIAGNOSIS — C3490 Malignant neoplasm of unspecified part of unspecified bronchus or lung: Secondary | ICD-10-CM | POA: Insufficient documentation

## 2012-12-06 DIAGNOSIS — I10 Essential (primary) hypertension: Secondary | ICD-10-CM | POA: Insufficient documentation

## 2012-12-06 DIAGNOSIS — M109 Gout, unspecified: Secondary | ICD-10-CM | POA: Insufficient documentation

## 2012-12-06 DIAGNOSIS — J4489 Other specified chronic obstructive pulmonary disease: Secondary | ICD-10-CM | POA: Insufficient documentation

## 2012-12-06 HISTORY — DX: Allergy, unspecified, initial encounter: T78.40XA

## 2012-12-06 LAB — HEPATITIS C RNA QUANTITATIVE: HCV Quantitative: NOT DETECTED IU/mL (ref <15)

## 2012-12-06 NOTE — Progress Notes (Signed)
See progress note under physician encounter. 

## 2012-12-06 NOTE — Progress Notes (Signed)
[facility] Radiation Oncology NEW PATIENT EVALUATION  Name: [name] MRN: [id number]  Date:   12/06/2012           DOB: 02-16-1929  Status: outpatient   CC: [name], MD  Si Gaul, MD    REFERRING PHYSICIAN: Si Gaul, MD   DIAGNOSIS: Non-small cell carcinoma the right lung, stage IV (T2a N2 M1b)   HISTORY OF PRESENT ILLNESS:  [name] is a 79 y.o. female who is seen today for the courtesy of Dr. Arbutus Ped for consideration of palliative radiation therapy to her right lung and mediastinum in the management of her stage IV non-small cell carcinoma of the lung. She was diagnosed in August of 2013 with adenocarcinoma found to be EGFR negative and ALK negative. She went on to receive chemotherapy with carboplatin/Alimta followed by maintenance Alimta, then Tarceva beginning in March of 2014 then changing to docetaxel in June of 2014, but discontinued after 3 cycles for disease progression. Her most recent CT scan of the chest on 11/25/2012 showed interval growth of her right middle lobe mass now measuring 8.3 x 6.4 cm compared to 6.3 x 5.5 cm in may of 2014. There also appeared to be scattered 3-5 mm nodules in left upper lobe and right middle lobe suspicious for pulmonary metastases. She also has thoracic/mediastinal  adenopathy which has mildly progressed. She describes intermittent episodes of dyspnea which may may not be related to activity. She  has more frequent episodes of cough which has been nonproductive. She denies dysphagia. She has lost approximately 30 pounds over the past year but has not had any significant weight loss for the past 2-3 months. Dr. Arbutus Ped is interested in her receiving radiation therapy to her enlarging right lung mass and mediastinal disease.  PREVIOUS RADIATION THERAPY: No   PAST MEDICAL HISTORY:  has a past medical history of Cervical spondylosis; Renal cyst; Diverticulosis; Vertigo, benign positional; Hyperlipidemia;  Aortic sclerosis; Erosive osteoarthritis of hand; History of calcium pyrophosphate deposition disease (CPPD); Gout; Mediastinal adenopathy; Tobacco use; COPD, mild; Glaucoma; Hypertension; Osteopenia; Hiatal hernia; Hemorrhoids; H/O urinary incontinence; Childhood asthma; Bronchitis; TIA (transient ischemic attack); Claustrophobia; Dizziness; Bleeding from the nose; lung ca (11/2011); and Allergy.     PAST SURGICAL HISTORY:  Past Surgical History  Procedure Laterality Date  . Cystoscopy, transurethral collagen injection therapy    . Left total knee arthroplasty    . Appendectomy    . Hemorrhoid surgery    . Cataract surgery x 2    . Eye surgery    . Tonsillectomy    . Abdominal hysterectomy    . Colonoscopy       FAMILY HISTORY: family history includes Cirrhosis in her mother; Heart disease in her father. Her father died of cardiac disease at 37 in her mother died from alcoholic cirrhosis and 19. Her daughter was diagnosed with breast cancer at age 24 and treated with surgery alone.   SOCIAL HISTORY:  reports that she quit smoking about 9 years ago. Her smoking use included Cigarettes. She has a 70 pack-year smoking history. She has never used smokeless tobacco. She reports that  drinks alcohol. She reports that she does not use illicit drugs. Would've for the past 10 years, 2 children. 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   ALLERGIES: Shellfish allergy; Septra ds; Statins; Tape; Wellbutrin; and Taxotere   MEDICATIONS:  Current Outpatient Prescriptions  Medication Sig Dispense Refill  . ADVAIR DISKUS 250-50 MCG/DOSE AEPB Inhale 1 puff into the  lungs daily as needed (shortness of breath).       Marland Kitchen allopurinol (ZYLOPRIM) 100 MG tablet Take 100 mg by mouth 2 (two) times daily.       Marland Kitchen ALPRAZolam (XANAX) 0.25 MG tablet 0.25 mg 3 (three) times daily as needed (nausea).       Marland Kitchen aspirin 81 MG tablet Take 81 mg by mouth every evening.       . B Complex-C (B-COMPLEX WITH VITAMIN C)  tablet Take 1 tablet by mouth every morning.       . Cholecalciferol (VITAMIN D) 2000 UNITS tablet Take 2,000 Units by mouth every evening.       . diclofenac sodium (VOLTAREN) 1 % GEL Apply 4 g topically 4 (four) times daily. Apply to lower extremity joints      . diltiazem 2 % GEL Apply 1 application topically 2 (two) times daily as needed (hemmorrhoids).       . estrogens, conjugated, (PREMARIN) 0.625 MG tablet Take 0.625 mg by mouth every morning.       . fish oil-omega-3 fatty acids 1000 MG capsule Take 1 g by mouth 2 (two) times daily.       Marland Kitchen ipratropium (ATROVENT) 0.06 % nasal spray Place 2 sprays into the nose 4 (four) times daily as needed.       Marland Kitchen LORazepam (ATIVAN) 0.5 MG tablet Take 1 tablet (0.5 mg total) by mouth every 8 (eight) hours as needed for anxiety.  30 tablet  0  . Multiple Vitamin (MULTIVITAMIN WITH MINERALS) TABS Take 1 tablet by mouth every morning.       . nebivolol (BYSTOLIC) 5 MG tablet Take 5 mg by mouth every evening.       . ondansetron (ZOFRAN-ODT) 8 MG disintegrating tablet TAKE 1 TABLET UNDER TONGUE EVERY 6 TO 8 HOURS AS NEEDED FOR NAUSEA/VOMITING  30 tablet  1  . oxyCODONE-acetaminophen (PERCOCET/ROXICET) 5-325 MG per tablet Take 1 tablet by mouth every 6 (six) hours as needed for pain.  30 tablet  0  . timolol (TIMOPTIC-XR) 0.5 % ophthalmic gel-forming Place 1 drop into the left eye 2 (two) times daily.       . Alum & Mag Hydroxide-Simeth (MAGIC MOUTHWASH) SOLN Take 5 mLs by mouth 4 (four) times daily as needed (mouth sores).       No current facility-administered medications for this encounter.     REVIEW OF SYSTEMS:  Pertinent items are noted in HPI.    PHYSICAL EXAM:  height is 5\' 1"  (1.549 m) and weight is 127 lb 14.4 oz (58.015 kg). Her oral temperature is 97.7 F (36.5 C). Her blood pressure is 127/66 and her pulse is 79. Her respiration is 16 and oxygen saturation is 95%.   Alert and oriented 79 year old white female who appears younger than her  stated age. Head and neck examination: She wears a wig. Nodes: Without palpable cervical or supraclavicular lymphadenopathy. Chest: Lungs clear. Heart: Regular rate and rhythm. Back: Without spinal or CVA tenderness. Abdomen: Without hepatomegaly. Extremities: Without edema. Neurologic simulation: Grossly nonfocal.   LABORATORY DATA:  Lab Results  Component Value Date   WBC 10.0 11/30/2012   HGB 10.4* 11/30/2012   HCT 31.5* 11/30/2012   MCV 98.7 11/30/2012   PLT 348 11/30/2012   Lab Results  Component Value Date   NA 140 12/02/2012   K 4.2 12/02/2012   CL 102 10/24/2012   CO2 23 12/02/2012   Lab Results  Component Value Date   ALT  31 12/02/2012   AST 76* 12/02/2012   ALKPHOS 177* 12/02/2012   BILITOT 0.38 12/02/2012      IMPRESSION: Stage IV (T2a N2 M1b) non-small cell (adenocarcinoma) of the right lung with recent disease progression. I feel that it would be reasonable to offer her radiation therapy to her right lung mass and mediastinum to to prevent further dyspnea, improve her cough, and reduce her risk for dysphagia based on her subcarinal adenopathy. We discussed the potential acute and late toxicities of radiation therapy. Her treatment should be well tolerated. I anticipate delivering approximately 2000 cGy in 2 weeks. Further radiation therapy can be given in the future if clinically indicated. Consent is signed today.    PLAN:  as discussed above. Will have her return for simulation/treatment planning this week.   I spent 40 minutes minutes face to face with the patient and more than 50% of that time was spent in counseling and/or coordination of care.

## 2012-12-06 NOTE — Progress Notes (Signed)
Complete PATIENT MEASURE OF DISTRESS worksheet with a score of 8 submitted to social work.  

## 2012-12-06 NOTE — Progress Notes (Signed)
Patient and her daughter, [name], here today for consultation with Dr. Dayton Scrape to discuss role of radiation therapy in the treatment of new large mass arising from the right middle lobe lung. Patient reports infrequent intermittent episodes of shortness of breath and cough. Patient denies hemoptysis. Reports intermittent episodes of left axilla, neck and right lower back grabbing pain. Denies painful or difficulty swallowing. Reports 30 lb weight loss since beginning chemotherapy 12/16/2011 of last year. Reports appetite is improving. Reports difficulty remaining asleep during the night. Denies night sweats. Reports that she is very claustrophobic. Has partial lower plate and complete upper plate dentures. Wears a wig. Denies headache, dizziness or vomiting. Reports nausea x2 days for which she takes zofran. Reports thigh muscles aches. Scheduled to follow up with Dr. Arbutus Ped on 02/01/2013 with a CT.

## 2012-12-07 ENCOUNTER — Ambulatory Visit: Payer: Medicare Other

## 2012-12-07 ENCOUNTER — Other Ambulatory Visit: Payer: Medicare Other | Admitting: Lab

## 2012-12-08 ENCOUNTER — Ambulatory Visit
Admission: RE | Admit: 2012-12-08 | Discharge: 2012-12-08 | Disposition: A | Discharge status: Home / Self Care | Payer: Medicare Other | Source: Ambulatory Visit | Attending: Radiation Oncology | Admitting: Radiation Oncology

## 2012-12-08 ENCOUNTER — Telehealth: Payer: Self-pay | Admitting: *Deleted

## 2012-12-08 DIAGNOSIS — C3490 Malignant neoplasm of unspecified part of unspecified bronchus or lung: Secondary | ICD-10-CM | POA: Insufficient documentation

## 2012-12-08 DIAGNOSIS — R0609 Other forms of dyspnea: Secondary | ICD-10-CM | POA: Insufficient documentation

## 2012-12-08 DIAGNOSIS — M7989 Other specified soft tissue disorders: Secondary | ICD-10-CM | POA: Insufficient documentation

## 2012-12-08 DIAGNOSIS — R0989 Other specified symptoms and signs involving the circulatory and respiratory systems: Secondary | ICD-10-CM | POA: Insufficient documentation

## 2012-12-08 DIAGNOSIS — R599 Enlarged lymph nodes, unspecified: Secondary | ICD-10-CM | POA: Insufficient documentation

## 2012-12-08 DIAGNOSIS — Z510 Encounter for antineoplastic radiation therapy: Secondary | ICD-10-CM | POA: Insufficient documentation

## 2012-12-08 NOTE — Progress Notes (Signed)
Complex simulation/treatment planning note: The patient was taken to the CT simulator and placed supine. Her chest was scanned. After careful review of her recent diagnostic CT scan of the chest, I contoured her right primary tumor mass and also right mediastinal adenopathy. She will be set up to AP and PA fields. I prescribing 2000 cGy in 10 sessions utilizing 10 MV photons. She is now ready for 3-D simulation.

## 2012-12-08 NOTE — Telephone Encounter (Signed)
Pt called stating that she is due for a mammogram and wants to know if Dr Donnald Garre thinks she should have it.  Per Dr Donnald Garre, if pt is due for a mammogram she should have it.  She verbalized understanding.  SLJ

## 2012-12-12 ENCOUNTER — Ambulatory Visit: Payer: Medicare Other | Admitting: Internal Medicine

## 2012-12-12 ENCOUNTER — Other Ambulatory Visit: Payer: Medicare Other | Admitting: Lab

## 2012-12-14 ENCOUNTER — Encounter: Payer: Self-pay | Admitting: Radiation Oncology

## 2012-12-14 NOTE — Progress Notes (Signed)
3-D simulation note: The patient completed 3-D simulation in the management of her non-small cell carcinoma the right lung. She was set up to AP and PA fields. 2 separate multileaf collimators were designed to conform the field. Dose volume histograms were obtained for the target structures and also the avoidance structures including the lungs. We met our departmental guidelines. I prescribing 2000 cGy in 10 sessions utilizing mixed 6 MV/15 MV photons.

## 2012-12-15 ENCOUNTER — Ambulatory Visit
Admission: RE | Admit: 2012-12-15 | Discharge: 2012-12-15 | Disposition: A | Discharge status: Home / Self Care | Payer: Medicare Other | Source: Ambulatory Visit | Attending: Radiation Oncology | Admitting: Radiation Oncology

## 2012-12-15 ENCOUNTER — Ambulatory Visit: Payer: Medicare Other | Admitting: Radiation Oncology

## 2012-12-15 ENCOUNTER — Encounter: Payer: Self-pay | Admitting: Radiation Oncology

## 2012-12-15 ENCOUNTER — Ambulatory Visit: Payer: Medicare Other

## 2012-12-15 NOTE — Progress Notes (Signed)
Weekly Management Note:  Site: Right lung/mediastinum/right clavicular region Current Dose:  200  cGy Projected Dose: 2000  cGy  Narrative: The patient is seen today for routine under treatment assessment. CBCT/MVCT images/port films were reviewed. The chart was reviewed.   The patient called earlier today to see if she could begin her radiation therapy because of "shortness of breath when talking and slight hoarseness". She denies dyspnea on exertion. She begin her treatment today and I am seeing her after her first treatment.  Physical Examination:  Filed Vitals:   12/15/12 1324  BP: 147/83  Pulse: 81  Temp: 97.6 F (36.4 C)  .  Weight: 129 lb 12.8 oz (58.877 kg). She is in no acute respiratory distress. There is palpable adenopathy along the right medial supraclavicular region as seen on her CT scan. I did not perform indirect mirror examination.  Impression: I suspect that the patient is developing right recurrent laryngeal nerve paralysis secondary to her right supraclavicular/perilaryngeal adenopathy. I reviewed her scans with her and also her daughter. This area he is currently being treated. It may be that we need to give more than a prescribed 2000 cGy to the right laryngeal adenopathy if she still symptomatic by completion of her prescribed 2 weeks of treatment.  Plan: Continue radiation therapy as planned.

## 2012-12-15 NOTE — Progress Notes (Addendum)
[name] is here today with report of hoarseness with increasing SOB for the past 4 days.  She states SOB only occurs when she is talking, but not when she is ambulating. Her O2 sat is 96 % on RA while sitting.  Note raspy voice and also note swelling in the lower bilateral neck regions, greater on the right.  Mild visualization of veins on right upper chest.  She has used her Advair, but has not obtained any relief of the SOB.

## 2012-12-15 NOTE — Progress Notes (Signed)
Simulation verification note: The patient underwent simulation verification for treatment to her right lung and mediastinum. Her isocenter or is in good position and the multileaf collimators contoured the treatment volume appropriately.

## 2012-12-16 ENCOUNTER — Ambulatory Visit
Admission: RE | Admit: 2012-12-16 | Discharge: 2012-12-16 | Disposition: A | Discharge status: Home / Self Care | Payer: Medicare Other | Source: Ambulatory Visit | Attending: Radiation Oncology | Admitting: Radiation Oncology

## 2012-12-20 ENCOUNTER — Ambulatory Visit
Admission: RE | Admit: 2012-12-20 | Discharge: 2012-12-20 | Disposition: A | Discharge status: Home / Self Care | Payer: Medicare Other | Source: Ambulatory Visit | Attending: Radiation Oncology | Admitting: Radiation Oncology

## 2012-12-20 ENCOUNTER — Other Ambulatory Visit: Payer: Medicare Other

## 2012-12-20 ENCOUNTER — Encounter: Payer: Self-pay | Admitting: Radiation Oncology

## 2012-12-20 NOTE — Progress Notes (Signed)
Weekly Management Note:  Site: Right lung/mediastinal lymph nodes Current Dose:  600  cGy Projected Dose: 2000  cGy  Narrative: The patient is seen today for routine under treatment assessment. CBCT/MVCT images/port films were reviewed. The chart was reviewed.   She states that her dyspnea when talking and hoarseness are improved. She believes that she developed gout along her right fifth finger over the weekend. She'll try to see Dr. Zenovia Jordan today.  Physical Examination:  Filed Vitals:   12/20/12 1106  BP: 119/62  Pulse: 89  Resp: 18  .  Weight: 127 lb 9.6 oz (57.879 kg). There is some softening of her medial right supraclavicular adenopathy. On inspection of the right fifth finger there is swelling and erythema along her PIP which could represent gout.  Impression: Tolerating radiation therapy well. She'll see Dr. Nickola Major for management of her suspected gout.  Plan: Continue radiation therapy as planned.

## 2012-12-20 NOTE — Progress Notes (Signed)
Reports mild fatigue and that she tires easily. Denies shortness of breath. Reports a good appetite. Weight stable. Concerned she started losing her voice last Tuesday. Concerned pinkie of right hand may have gout reporting its very painful. Denies skin changes to her chest or upper back. Reports thick mucous in throat. Reports a productive cough with white sputum. Denies painful or difficult swallowing.

## 2012-12-21 ENCOUNTER — Ambulatory Visit
Admission: RE | Admit: 2012-12-21 | Discharge: 2012-12-21 | Disposition: A | Discharge status: Home / Self Care | Payer: Medicare Other | Source: Ambulatory Visit | Attending: Radiation Oncology | Admitting: Radiation Oncology

## 2012-12-21 MED ORDER — RADIAPLEXRX EX GEL
Freq: Once | CUTANEOUS | Status: AC
  Administered 2012-12-21: 11:00:00 via TOPICAL

## 2012-12-21 NOTE — Progress Notes (Signed)
Oriented patient to staff and routine of the clinic. Provided patient with RADIATION THERAPY AND YOU handbook then, reviewed pertinent information. Educated patient reference potential side effects and management such as, skin changes, throat changes and fatigue. Provided patient with radiaplex and directed upon use. All questions answered. Patient verbalized understanding of all reviewed.

## 2012-12-22 ENCOUNTER — Other Ambulatory Visit: Payer: Self-pay | Admitting: Internal Medicine

## 2012-12-22 ENCOUNTER — Other Ambulatory Visit: Payer: Self-pay | Admitting: Medical Oncology

## 2012-12-22 ENCOUNTER — Ambulatory Visit
Admission: RE | Admit: 2012-12-22 | Discharge: 2012-12-22 | Disposition: A | Discharge status: Home / Self Care | Payer: Medicare Other | Source: Ambulatory Visit | Attending: Radiation Oncology | Admitting: Radiation Oncology

## 2012-12-22 NOTE — Telephone Encounter (Signed)
I called in ativan refill

## 2012-12-22 NOTE — Telephone Encounter (Signed)
PCP wants to treat pt for gout with prednisone -pt cannot take colcrys. Per Dr Arbutus Ped it is okay for pt to take prednisone. Called to [place].

## 2012-12-23 ENCOUNTER — Ambulatory Visit
Admission: RE | Admit: 2012-12-23 | Discharge: 2012-12-23 | Disposition: A | Discharge status: Home / Self Care | Payer: Medicare Other | Source: Ambulatory Visit | Attending: Radiation Oncology | Admitting: Radiation Oncology

## 2012-12-26 ENCOUNTER — Other Ambulatory Visit: Payer: Medicare Other | Admitting: Lab

## 2012-12-26 ENCOUNTER — Ambulatory Visit
Admission: RE | Admit: 2012-12-26 | Discharge: 2012-12-26 | Disposition: A | Discharge status: Home / Self Care | Payer: Medicare Other | Source: Ambulatory Visit | Attending: Radiation Oncology | Admitting: Radiation Oncology

## 2012-12-26 ENCOUNTER — Encounter: Payer: Self-pay | Admitting: Radiation Oncology

## 2012-12-26 NOTE — Progress Notes (Signed)
Pt reports dry "aggravating" cough, SOB w/talking and exertion, fatigue. She denies loss of appetite, reports slight difficulty swallowing recently noticed. Pt applying lotion to upper right back, denies skin changes on right chest.  Pt states Ibuprofen caused nausea, added to allergy list. Pt reports nightly sharp pain at base of right neck that radiates up her neck. She states the pain "comes and goes". She has not taken any pain medications for this. She has applied heat which gave relief. She denies pain in her neck when lying on radiation treatment table.

## 2012-12-26 NOTE — Progress Notes (Signed)
Weekly Management Note:  Outpatient Current Dose:  14 Gy  Projected Dose: 20 Gy   Narrative:  The patient presents for routine under treatment assessment.  CBCT/MVCT images/Port film x-rays were reviewed.  The chart was checked. She reports dry cough but says it is not bothering her that much.  Has cough syrup at home which she doesn't feel the need to use, yet.  She is SOB w/talking and exertion, fatigue. She denies loss of appetite, reports slight difficulty swallowing recently noticed. Pt applying lotion to upper right back, denies skin changes on right chest.   Hoarseness significant better - almost gone.  Pt reports nightly sharp pain at base of right neck that radiates up her neck. She states the pain "comes and goes". She has not taken any pain medications for this. She has applied heat which gave relief. She denies pain in her neck when lying on radiation treatment table   Physical Findings:  weight is 127 lb 12.8 oz (57.97 kg). Her oral temperature is 97.4 F (36.3 C). Her blood pressure is 152/61 and her pulse is 81. Her respiration is 20 and oxygen saturation is 96%.  NAD, no obvious hoarseness.  CBC    Component Value Date/Time   WBC 10.0 11/30/2012 1139   WBC 10.3 10/24/2012 1808   RBC 3.19* 11/30/2012 1139   RBC 2.52* 10/24/2012 1808   HGB 10.4* 11/30/2012 1139   HGB 8.1* 10/24/2012 1808   HCT 31.5* 11/30/2012 1139   HCT 25.0* 10/24/2012 1808   PLT 348 11/30/2012 1139   PLT 232 10/24/2012 1808   MCV 98.7 11/30/2012 1139   MCV 99.2 10/24/2012 1808   MCH 32.7 11/30/2012 1139   MCH 32.1 10/24/2012 1808   MCHC 33.1 11/30/2012 1139   MCHC 32.4 10/24/2012 1808   RDW 20.7* 11/30/2012 1139   RDW 16.2* 10/24/2012 1808   LYMPHSABS 1.3 11/30/2012 1139   LYMPHSABS 1.6 10/24/2012 1808   MONOABS 1.0* 11/30/2012 1139   MONOABS 1.4* 10/24/2012 1808   EOSABS 0.2 11/30/2012 1139   EOSABS 0.0 10/24/2012 1808   BASOSABS 0.0 11/30/2012 1139   BASOSABS 0.1 10/24/2012 1808     CMP     Component Value Date/Time     NA 140 12/02/2012 1125   NA 137 10/24/2012 1808   K 4.2 12/02/2012 1125   K 3.7 10/24/2012 1808   CL 102 10/24/2012 1808   CL 107 09/26/2012 1247   CO2 23 12/02/2012 1125   CO2 23 10/24/2012 1808   GLUCOSE 100 12/02/2012 1125   GLUCOSE 119* 10/24/2012 1808   GLUCOSE 220* 09/26/2012 1247   BUN 26.7* 12/02/2012 1125   BUN 25* 10/24/2012 1808   CREATININE 1.0 12/02/2012 1125   CREATININE 1.17* 10/24/2012 1808   CALCIUM 9.3 12/02/2012 1125   CALCIUM 9.0 10/24/2012 1808   PROT 7.1 12/02/2012 1125   PROT 6.4 10/24/2012 1808   ALBUMIN 2.5* 12/02/2012 1125   ALBUMIN 2.4* 10/24/2012 1808   AST 76* 12/02/2012 1125   AST 46* 10/24/2012 1808   ALT 31 12/02/2012 1125   ALT 33 10/24/2012 1808   ALKPHOS 177* 12/02/2012 1125   ALKPHOS 155* 10/24/2012 1808   BILITOT 0.38 12/02/2012 1125   BILITOT 0.2* 10/24/2012 1808   GFRNONAA 43* 10/24/2012 1808   GFRAA 49* 10/24/2012 1808     Impression:  The patient is tolerating radiotherapy.   Plan:  Continue radiotherapy as planned. Suggested she call Dr. Asa Lente nurse or PA to see if he would  favor adding CT neck to her current imaging for Oct (not sure if it is in her orders - EPIC says CT imaging of Body for 10-15.  1 mo f/u ordered with Dr Dayton Scrape  -----------------------------------  Lonie Peak, MD

## 2012-12-27 ENCOUNTER — Ambulatory Visit
Admission: RE | Admit: 2012-12-27 | Discharge: 2012-12-27 | Disposition: A | Discharge status: Home / Self Care | Payer: Medicare Other | Source: Ambulatory Visit | Attending: Radiation Oncology | Admitting: Radiation Oncology

## 2012-12-28 ENCOUNTER — Ambulatory Visit
Admission: RE | Admit: 2012-12-28 | Discharge: 2012-12-28 | Disposition: A | Discharge status: Home / Self Care | Payer: Medicare Other | Source: Ambulatory Visit | Attending: Radiation Oncology | Admitting: Radiation Oncology

## 2012-12-28 NOTE — Progress Notes (Signed)
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 She states she is eating in small bites, chewing well. She states she has been able to take all her medications except Allopurinol and some of her vitamins. She states the tablet is scored, and she broke it in half yesterday and was able to swallow. Advised her she can crush tablet and put in pudding, applesauce. Pt states she will do that today. Advised her to return to nursing tomorrow if her sore throat is worse. Pt will complete treatment tomorrow. Pt verbalized understanding of above.

## 2012-12-29 ENCOUNTER — Ambulatory Visit
Admission: RE | Admit: 2012-12-29 | Discharge: 2012-12-29 | Disposition: A | Discharge status: Home / Self Care | Payer: Medicare Other | Source: Ambulatory Visit | Attending: Radiation Oncology | Admitting: Radiation Oncology

## 2012-12-30 ENCOUNTER — Telehealth: Payer: Self-pay | Admitting: *Deleted

## 2012-12-30 ENCOUNTER — Ambulatory Visit: Payer: Medicare Other

## 2012-12-30 ENCOUNTER — Telehealth: Payer: Self-pay | Admitting: Medical Oncology

## 2012-12-30 NOTE — Telephone Encounter (Signed)
Daughter reports [name] woke up dizzy and  she could not  get out of bed this am  , but is sitting in chair now with her head back. Asking what to do . I encouraged her to make sure pt drinks and is eating. I transferred call to radiation since pt just completed treatment yesterday.

## 2012-12-30 NOTE — Telephone Encounter (Signed)
Returned vm from pt's daughter who states her mother woke up today "so dizzy she couldn't get out of bed for a while". She states her mother is eating, drinking well, and daughter feels her mother is taking all her medications correctly. Daughter states pt has not c/o HA, nausea, vision changes, earache, cough, SOB, other problems. Pt has sore throat which daughter states she has from radiation treatments. Pt completed radiation on 12/29/12 to her right lung, mediastinal area. She was seen by Dr Basilio Cairo on 12/26/12. Advised daughter take pt to PCP and/or check her BP. Daughter states pt has a "new BP machine", but daughter expresses concern over accuracy of BP machine. Daughter asks what to do if pt's dizziness worsens over weekend. Advised she would then either call on call dr at [facility], take her mother to Urgent Care or to nearest ED. Daughter states she will check her mother's BP w/her machine at home and maybe take her to [facility] for BP check. Encouraged daughter to call this office back if she feels she needs to later today. Daughter verbalized agreement and understanding of above plan.

## 2012-12-31 ENCOUNTER — Encounter: Payer: Self-pay | Admitting: Radiation Oncology

## 2012-12-31 NOTE — Progress Notes (Signed)
[facility] Radiation Oncology End of Treatment Note  Name:[name]  Date: 12/31/2012 AVW:[id number] DOB:[birth year]   Status:outpatient    CC: [name], MD  Dr. Si Gaul  REFERRING PHYSICIAN: Dr. Si Gaul      DIAGNOSIS: Stage IV non-small cell carcinoma the right lung (T2a N2 M1b)   INDICATION FOR TREATMENT: Palliative   TREATMENT DATES: 12/15/2012 through 12/29/2012                          SITE/DOSE:  Right lung/mediastinum                          BEAMS/ENERGY:  Mixed 6 MV/15 MV photons, parallel opposed anterior and posterior fields                 NARRATIVE:  Just prior to initiation of radiation therapy she developed hoarseness felt to be secondary to recurrent laryngeal nerve involvement. Fortunately, her hoarseness improved during the first week of her therapy, and she had no hoarseness on completion of therapy.   She had no significant dysphagia by completion of therapy.                       PLAN: Routine followup in one month. Patient instructed to call if questions or worsening complaints in interim. I believe that Dr. Shirline Frees we'll move ahead with  further chemotherapy.

## 2013-01-02 ENCOUNTER — Ambulatory Visit: Payer: Medicare Other

## 2013-01-02 ENCOUNTER — Telehealth: Payer: Self-pay | Admitting: *Deleted

## 2013-01-02 NOTE — Telephone Encounter (Signed)
Called pt re: update on her condition. Pt states she has not had any relief from her dizziness. She states she called Dr Truett Perna yesterday, was instructed to stop her BP med and see her PCP. Pt states she stopped her BP med and has appointment w/her PCP today at 3 pm. Pt thanked this RN for the call.

## 2013-01-09 ENCOUNTER — Telehealth: Payer: Self-pay | Admitting: *Deleted

## 2013-01-09 NOTE — Telephone Encounter (Signed)
Pt's daughter [name] called stating that pt is starting to have severe headaches off and on.  No memory loss, she states she has had some trouble balancing intermittently.  This has been going on for the past several weeks.  Oxycodone does help alleviate the pain.  Per Dr Donnald Garre, pt needs to have MRI of the brain with contrast.  Pt's daughter [name] verbalized understanding.

## 2013-01-10 ENCOUNTER — Telehealth: Payer: Self-pay | Admitting: *Deleted

## 2013-01-10 MED ORDER — ALPRAZOLAM 0.25 MG PO TABS: 0.2500 mg | ORAL_TABLET | ORAL | Status: DC | PRN

## 2013-01-10 NOTE — Telephone Encounter (Signed)
Pt called requesting someone for anxiety during the MRI tomorrow.  Per Dr Donnald Garre okay to give xanax 0.25 1 hr before, then may repeat x 1 right before MRI.  SLJ

## 2013-01-10 NOTE — Telephone Encounter (Signed)
Pt;s daugther called wanting to see if pt could take tylenol prn and whether she could take a stool softener for constipation.  Informed her that it is okay for pt to take tylenol 1-2 tabs (325mg  q6h prn per Dr Donnald Garre) and it is okay for pt to take a prn stool softener.  SLJ

## 2013-01-11 ENCOUNTER — Ambulatory Visit
Admission: RE | Admit: 2013-01-11 | Discharge: 2013-01-11 | Disposition: A | Discharge status: Home / Self Care | Payer: Medicare Other | Source: Ambulatory Visit | Attending: Internal Medicine | Admitting: Internal Medicine

## 2013-01-11 DIAGNOSIS — C7931 Secondary malignant neoplasm of brain: Secondary | ICD-10-CM

## 2013-01-11 HISTORY — DX: Secondary malignant neoplasm of brain: C79.31

## 2013-01-11 MED ORDER — GADOBENATE DIMEGLUMINE 529 MG/ML IV SOLN
10.0000 mL | Freq: Once | INTRAVENOUS | Status: AC | PRN
  Administered 2013-01-11: 10 mL via INTRAVENOUS

## 2013-01-12 ENCOUNTER — Encounter: Payer: Self-pay | Admitting: Radiation Oncology

## 2013-01-12 ENCOUNTER — Other Ambulatory Visit: Payer: Self-pay | Admitting: Radiation Oncology

## 2013-01-12 ENCOUNTER — Telehealth: Payer: Self-pay | Admitting: Oncology

## 2013-01-12 MED ORDER — LANSOPRAZOLE 30 MG PO CPDR: 30.0000 mg | DELAYED_RELEASE_CAPSULE | Freq: Every day | ORAL | Status: AC

## 2013-01-12 MED ORDER — DEXAMETHASONE 4 MG PO TABS: 4.0000 mg | ORAL_TABLET | Freq: Two times a day (BID) | ORAL | Status: DC

## 2013-01-12 NOTE — Telephone Encounter (Signed)
Cesar Alf called and said Dr. Roselind Messier had called in prescriptions for decadron and prevacid.  She had picked up the medications and wanted to clarify the directions for the decadron.  She thought she had written down that she was supposed to take it four times a day but the bottle said to take it twice a day.  Instructed her to take the decadron twice a day with a meal per the prescription instructions and Dr. Trina Ao documentation note.

## 2013-01-12 NOTE — Progress Notes (Signed)
   Department of Radiation Oncology  Phone:  [phone] Fax:        [phone]  Progress Note  Diagnosis stage IV non-small cell lung cancer  The patient recently developed headaches primarily occipital in nature as well as visual problems. She completed an MRI of the brain yesterday which shows multiple brain metastasis the most significant lesions being in the occipital area. I discussed these findings with the patient this morning. She will be placed on dexamethasone 4 mg twice daily. I've instructed her not to take her prednisone while she is taking dexamethasone. Patient will also be placed on Prevacid to help reduce gastric acid secretions. She will be set up to see Dr. Dayton Scrape early next week for reevaluation and likely simulation and treatment.  -----------------------------------  Billie Lade, PhD, MD

## 2013-01-13 ENCOUNTER — Encounter: Payer: Self-pay | Admitting: Radiation Oncology

## 2013-01-13 DIAGNOSIS — C7931 Secondary malignant neoplasm of brain: Secondary | ICD-10-CM | POA: Insufficient documentation

## 2013-01-13 NOTE — Progress Notes (Addendum)
Location/Histology of Brain Tumor: mult lesions, most significant lesions in occipital area  Patient presented with symptoms of:  Headaches, confusion  Past or anticipated interventions, if any, per neurosurgery: none  Past or anticipated interventions, if any, per medical oncology: none  Dose of Decadron, if applicable: 4 mg twice daily since 01/12/13  Recent neurologic symptoms, if any:   Seizures: no  Headaches: yes, left side of head to occipital area left side  Nausea: yes, takes Ativan, Zofran  Dizziness/ataxia: yes, unsteadiness reported  Difficulty with hand coordination: no, shakiness reported since beginning steroid  Focal numbness/weakness: no  Visual deficits/changes: yes, bilateral blurred vision  Confusion/Memory deficits: no  Painful bone metastases at present, if any: none  SAFETY ISSUES:  Prior radiation? Yes, 12/15/12- 12/29/12, right lung, mediastinum  Pacemaker/ICD? no  Possible current pregnancy? na  Is the patient on methotrexate? no  Additional Complaints / other details: recently developed headaches, visual problems. Pt denies pain today but states a dull headache , left occipital area woke her at 5 am; she went back to sleep. She reports occasional sharp pains on left side of face and left back of head, nausea almost daily, dizziness, unsteadiness, fatigue, loss of appetite.  She denies falling, vomiting. She takes Tylenol prn for HA w/relief. She states that ever since she began steroid she "doesn't feel good and is shaky".  No unsteady gait noted today, alert and oriented x 3.

## 2013-01-16 ENCOUNTER — Encounter: Payer: Self-pay | Admitting: Radiation Oncology

## 2013-01-17 ENCOUNTER — Ambulatory Visit
Admission: RE | Admit: 2013-01-17 | Discharge: 2013-01-17 | Disposition: A | Discharge status: Home / Self Care | Payer: Medicare Other | Source: Ambulatory Visit | Attending: Radiation Oncology | Admitting: Radiation Oncology

## 2013-01-17 ENCOUNTER — Encounter: Payer: Self-pay | Admitting: Radiation Oncology

## 2013-01-17 ENCOUNTER — Ambulatory Visit: Payer: Medicare Other | Admitting: Radiation Oncology

## 2013-01-17 VITALS — BP 140/77 | HR 86 | Temp 97.4°F | Resp 20 | Wt 126.0 lb

## 2013-01-17 DIAGNOSIS — C3490 Malignant neoplasm of unspecified part of unspecified bronchus or lung: Secondary | ICD-10-CM | POA: Insufficient documentation

## 2013-01-17 DIAGNOSIS — R110 Nausea: Secondary | ICD-10-CM | POA: Insufficient documentation

## 2013-01-17 DIAGNOSIS — R490 Dysphonia: Secondary | ICD-10-CM | POA: Insufficient documentation

## 2013-01-17 DIAGNOSIS — R51 Headache: Secondary | ICD-10-CM | POA: Insufficient documentation

## 2013-01-17 DIAGNOSIS — B370 Candidal stomatitis: Secondary | ICD-10-CM | POA: Insufficient documentation

## 2013-01-17 DIAGNOSIS — C7931 Secondary malignant neoplasm of brain: Secondary | ICD-10-CM

## 2013-01-17 DIAGNOSIS — Z510 Encounter for antineoplastic radiation therapy: Secondary | ICD-10-CM | POA: Insufficient documentation

## 2013-01-17 DIAGNOSIS — R112 Nausea with vomiting, unspecified: Secondary | ICD-10-CM | POA: Insufficient documentation

## 2013-01-17 DIAGNOSIS — J3800 Paralysis of vocal cords and larynx, unspecified: Secondary | ICD-10-CM | POA: Insufficient documentation

## 2013-01-17 DIAGNOSIS — E860 Dehydration: Secondary | ICD-10-CM | POA: Insufficient documentation

## 2013-01-17 DIAGNOSIS — Z79899 Other long term (current) drug therapy: Secondary | ICD-10-CM | POA: Insufficient documentation

## 2013-01-17 HISTORY — DX: Malignant neoplasm of unspecified part of unspecified bronchus or lung: C34.90

## 2013-01-17 HISTORY — DX: Personal history of irradiation: Z92.3

## 2013-01-17 HISTORY — DX: Secondary malignant neoplasm of brain: C79.31

## 2013-01-17 NOTE — Addendum Note (Signed)
Encounter addended by: Glennie Hawk, RN on: 01/17/2013 11:41 AM<BR>     Documentation filed: Charges VN

## 2013-01-17 NOTE — Progress Notes (Signed)
Complex simulation/treatment planning note: The patient was taken to the CT simulator. A head cast was constructed for immobilization. She was then scanned. Her normal structures including her eyes were contoured. She was set up to right and left lateral fields. 2 separate multileaf collimator designed to conform the field for a total of 3 complex treatment devices. I prescribing 3000 cGy in 10 sessions utilizing 6 MV photons.

## 2013-01-17 NOTE — Progress Notes (Signed)
Please see the Nurse Progress Note in the MD Initial Consult Encounter for this patient. 

## 2013-01-17 NOTE — Progress Notes (Signed)
CC: Dr. Si Gaul, Dr. 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 [name] is a most pleasant 79 year old female who is seen today at the request of Dr. Arbutus Ped for consideration of whole brain radiation therapy in the management of her metastatic non-small cell carcinoma of the lung. She finished a brief course of palliative radiotherapy to her right lung/mediastinum 3 weeks ago. Approximately 10 days ago she noted worsening occipital headaches which was brought to the attention of Dr. Arbutus Ped. She had a brain MR on 01/11/2013 which showed multiple bilateral metastases, with the largest lesions in the occipital lobes bilaterally measuring up to 2.3 cm on the left. Late last week she was started on dexamethasone at 4 mg by mouth twice a day. Her headaches are slightly improved. She also admits to difficulty with her vision, particularly her visual acuity. Just prior to her brain MR scan she saw her ophthalmologist who could not correct her vision. She also reports nausea but no vomiting. She is a little bit unsteady on her feet. 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  Physical examination: Alert and oriented. She looks well.  Vital signs: Filed Vitals:   01/17/13 1013  BP: 140/77  Pulse: 86  Temp: 97.4 F (36.3 C)  Resp: 20   Head and neck examination: Grossly unremarkable. She is slightly hoarse. Funduscopic examination without papilledema. Nodes: There is no palpable cervical or supraclavicular lymphadenopathy. Chest: Lungs clear. Neurologic examination: Cranial nerves II through XII intact with the exception of visual acuity. There are no gross field cuts. Cerebellar examination remarkable for slightly wide-based gait. Finger to nose testing slightly diminished bilaterally.  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 She has extensive metastatic disease. I'm increasing her dexamethasone to 4 mg by mouth 3 times a day. She is on Prevacid. I recommend a two-week course of whole brain radiation. We discussed the potential acute and late toxicities of radiation therapy, and consent is signed today. We'll proceed with simulation/treatment planning today and begin her radiation therapy tomorrow.  Plan: As discussed above.  30 minutes was spent face-to-face with the patient, primarily counseling the patient and coordinating her care.

## 2013-01-18 ENCOUNTER — Ambulatory Visit
Admission: RE | Admit: 2013-01-18 | Discharge: 2013-01-18 | Disposition: A | Discharge status: Home / Self Care | Payer: Medicare Other | Source: Ambulatory Visit | Attending: Radiation Oncology | Admitting: Radiation Oncology

## 2013-01-18 ENCOUNTER — Ambulatory Visit: Admission: RE | Admit: 2013-01-18 | Payer: Medicare Other | Source: Ambulatory Visit | Admitting: Radiation Oncology

## 2013-01-18 ENCOUNTER — Telehealth: Payer: Self-pay | Admitting: Medical Oncology

## 2013-01-18 VITALS — BP 161/96 | HR 93 | Temp 97.6°F | Resp 20

## 2013-01-18 DIAGNOSIS — C7931 Secondary malignant neoplasm of brain: Secondary | ICD-10-CM

## 2013-01-18 NOTE — Telephone Encounter (Signed)
[name] left message that pt still has persistent diarrhea. I returned Susan's call . Pt is in radiation.

## 2013-01-18 NOTE — Progress Notes (Addendum)
Pt and daughter in nursing after radiation treatment today due to pt's dizziness. Pt also states she continues to have diarrhea, 2-3 x daily. She has had diarrhea x several days, taking Imodium w/every loose BM. She states it sometimes appears as mucus. She states she also had small spot of blood on her pad this morning, but has hx of hemorrhoids. She denies blood in stool or on tissue when she wiped, or any blood w/stools since. She has had diarrhea x 2 today. Pt increased Allopurinol to 3 x daily several days ago per Dr Lendon Colonel. Advised she contact his office today to discuss this as probable cause of diarrhea and occasional stomach discomfort she also reports. Orthostatic VS taken today. Discussed foods and drinks easy on stomach, gave pt list from "Radiation and You" booklet which she states she has at home from previous radiation treatment. Discussed fluid options at length, encouraged her to increase fluid intake. Advised pt to come to nursing tomorrow as needed. Pt and daughter verbalized understanding. Offered to get wheelchair for pt; pt refused. Pt has steady slow gait. Daughter spoke on phone w/med onc RN as well during this time.  Post sim ed completed w/pt. Pt states she has "Radiation and You" booklet at home. Used booklet w/pt today and reviewed temporary side effects of radiation treatment to brain, re: fatigue, hair care, skin/scalp irritation/care, nausea, nutrition, pain. Pt verbalized understanding.

## 2013-01-18 NOTE — Progress Notes (Signed)
Simulation verification note: The patient underwent simulation verification for treatment to her whole brain. Her isocenter is in good position and the multileaf collimators contoured the treatment volume appropriately. 

## 2013-01-19 ENCOUNTER — Telehealth: Payer: Self-pay | Admitting: Medical Oncology

## 2013-01-19 ENCOUNTER — Ambulatory Visit
Admission: RE | Admit: 2013-01-19 | Discharge: 2013-01-19 | Disposition: A | Discharge status: Home / Self Care | Payer: Medicare Other | Source: Ambulatory Visit | Attending: Radiation Oncology | Admitting: Radiation Oncology

## 2013-01-19 NOTE — Telephone Encounter (Signed)
Addendum - i saw [name]  and her daughter in lobby yesterday and told her that Dr Arbutus Ped thinks diarrhea is coming from gout medicine. I suggested pt call her PCP about it and taking imodium.

## 2013-01-20 ENCOUNTER — Ambulatory Visit
Admission: RE | Admit: 2013-01-20 | Discharge: 2013-01-20 | Disposition: A | Discharge status: Home / Self Care | Payer: Medicare Other | Source: Ambulatory Visit | Attending: Radiation Oncology | Admitting: Radiation Oncology

## 2013-01-20 NOTE — Telephone Encounter (Signed)
erroneous

## 2013-01-23 ENCOUNTER — Ambulatory Visit
Admission: RE | Admit: 2013-01-23 | Discharge: 2013-01-23 | Disposition: A | Discharge status: Home / Self Care | Payer: Medicare Other | Source: Ambulatory Visit | Attending: Radiation Oncology | Admitting: Radiation Oncology

## 2013-01-23 ENCOUNTER — Encounter: Payer: Self-pay | Admitting: Radiation Oncology

## 2013-01-23 ENCOUNTER — Telehealth: Payer: Self-pay | Admitting: *Deleted

## 2013-01-23 VITALS — BP 109/71 | HR 112 | Temp 97.6°F | Resp 20 | Wt 121.3 lb

## 2013-01-23 DIAGNOSIS — C7931 Secondary malignant neoplasm of brain: Secondary | ICD-10-CM

## 2013-01-23 LAB — CBC WITH DIFFERENTIAL/PLATELET
Basophils Absolute: 0 10*3/uL (ref 0.0–0.1)
Eosinophils Absolute: 0 10*3/uL (ref 0.0–0.5)
HCT: 31.8 % — ABNORMAL LOW (ref 34.8–46.6)
HGB: 10.4 g/dL — ABNORMAL LOW (ref 11.6–15.9)
NEUT#: 9.7 10*3/uL — ABNORMAL HIGH (ref 1.5–6.5)
NEUT%: 90.6 % — ABNORMAL HIGH (ref 38.4–76.8)
Platelets: 145 10*3/uL (ref 145–400)
RDW: 19.1 % — ABNORMAL HIGH (ref 11.2–14.5)
WBC: 10.7 10*3/uL — ABNORMAL HIGH (ref 3.9–10.3)
lymph#: 0.3 10*3/uL — ABNORMAL LOW (ref 0.9–3.3)

## 2013-01-23 LAB — BASIC METABOLIC PANEL (CC13)
BUN: 28.8 mg/dL — ABNORMAL HIGH (ref 7.0–26.0)
Chloride: 103 mEq/L (ref 98–109)
Glucose: 76 mg/dl (ref 70–140)
Potassium: 4 mEq/L (ref 3.5–5.1)

## 2013-01-23 MED ORDER — SODIUM CHLORIDE 0.9 % IJ SOLN
10.0000 mL | Freq: Once | INTRAMUSCULAR | Status: AC
  Administered 2013-01-23: 10 mL via INTRAVENOUS

## 2013-01-23 MED ORDER — SODIUM CHLORIDE 0.9 % IV SOLN
Freq: Once | INTRAVENOUS | Status: AC
  Administered 2013-01-23: 13:00:00 via INTRAVENOUS
  Filled 2013-01-23: qty 250

## 2013-01-23 NOTE — Progress Notes (Addendum)
Previous telphone note: Received vm from pt's daughter, Ronin Crager who stated pt has had n/v over weekend and feels it is related to Decadron. Pt has medication for nausea but has not taken before taking steroid. Daughter's message requested pt be seen today for BP, assessment. Dr Dayton Scrape in surgery this morning, but pt will be seen in nursing and see Dr Basilio Cairo if needed. Notified Miranda RT on linac #3 to bring pt to nursing after radiation treatment today. Miranda verbalized understanding.  Pt now in nursing after radiation treatment. She c/o headache and weakness, denies other pain and nausea. Pt taking Tylenol 500 mg 2 tabs now. She states she has not had nausea/vomiting today. She stopped taking Decadron after her morning dose yesterday, did not take today, nor did she take Prevacid. She states she thinks Decadron is causing her nausea. She is on Decadron 4 mg TID. Pt states she vomited x 2 yesterday, did eat small amt yesterday and drank fluids. She has not eaten today, is drinking fluids. Instructed pt to take medication for nausea regularly as her nausea may be caused by her radiation to brain as well as her brain metastases.  Discussed possible need for IVF; pt may prefer to have IVF tomorrow so she is prepared to stay and her ride is also available to stay. Pt states her diarrhea has "slowed down a lot to very small amounts daily". She has not taken Imodium recently. Pt will see Dr Basilio Cairo today.

## 2013-01-23 NOTE — Telephone Encounter (Signed)
Pt's daughter called to give [place] an FYI stating pt saw her rheumatologist Dr Lendon Colonel regarding her gout medication.  She is now feeling much better.  SLJ

## 2013-01-23 NOTE — Addendum Note (Signed)
Encounter addended by: Delynn Flavin, RN on: 01/23/2013  3:07 PM<BR>     Documentation filed: Inpatient Document Flowsheet

## 2013-01-23 NOTE — Progress Notes (Signed)
   Weekly Management Note:  outpatient Current Dose:  12 Gy  Projected Dose: 30 Gy   Narrative:  The patient presents for routine under treatment assessment.  CBCT/MVCT images/Port film x-rays were reviewed.  The chart was checked. See Nursing Note.  Pt asked to be seen after RT due ot N/V that she perceives related to Decadron. Had been on  4mg  TID  (sometimes on empty stomach) but stopped this yesterday mornnig as well as stopped prevacid.  She reports worsening blurry vision over the past week and continued HA's. No sick contact. Diarrhea improving.  Not eating or drinking much, but tolerating "some" smoothies.  Physical Findings:  weight is 121 lb 4.8 oz (55.021 kg). Her oral temperature is 97.6 F (36.4 C). Her blood pressure is 109/71 and her pulse is 112. Her respiration is 20.   NAD, no thrush in mouth, mucous membranes slightly dry.  CBC    Component Value Date/Time   WBC 10.7* 01/23/2013 1222   WBC 10.3 10/24/2012 1808   RBC 3.28* 01/23/2013 1222   RBC 2.52* 10/24/2012 1808   HGB 10.4* 01/23/2013 1222   HGB 8.1* 10/24/2012 1808   HCT 31.8* 01/23/2013 1222   HCT 25.0* 10/24/2012 1808   PLT 145 01/23/2013 1222   PLT 232 10/24/2012 1808   MCV 96.9 01/23/2013 1222   MCV 99.2 10/24/2012 1808   MCH 31.8 01/23/2013 1222   MCH 32.1 10/24/2012 1808   MCHC 32.8 01/23/2013 1222   MCHC 32.4 10/24/2012 1808   RDW 19.1* 01/23/2013 1222   RDW 16.2* 10/24/2012 1808   LYMPHSABS 0.3* 01/23/2013 1222   LYMPHSABS 1.6 10/24/2012 1808   MONOABS 0.6 01/23/2013 1222   MONOABS 1.4* 10/24/2012 1808   EOSABS 0.0 01/23/2013 1222   EOSABS 0.0 10/24/2012 1808   BASOSABS 0.0 01/23/2013 1222   BASOSABS 0.1 10/24/2012 1808     CMP     Component Value Date/Time   NA 137 01/23/2013 1222   NA 137 10/24/2012 1808   K 4.0 01/23/2013 1222   K 3.7 10/24/2012 1808   CL 102 10/24/2012 1808   CL 107 09/26/2012 1247   CO2 26 01/23/2013 1222   CO2 23 10/24/2012 1808   GLUCOSE 76 01/23/2013 1222   GLUCOSE 119* 10/24/2012 1808   GLUCOSE 220* 09/26/2012  1247   BUN 28.8* 01/23/2013 1222   BUN 25* 10/24/2012 1808   CREATININE 0.9 01/23/2013 1222   CREATININE 1.17* 10/24/2012 1808   CALCIUM 8.6 01/23/2013 1222   CALCIUM 9.0 10/24/2012 1808   PROT 7.1 12/02/2012 1125   PROT 6.4 10/24/2012 1808   ALBUMIN 2.5* 12/02/2012 1125   ALBUMIN 2.4* 10/24/2012 1808   AST 76* 12/02/2012 1125   AST 46* 10/24/2012 1808   ALT 31 12/02/2012 1125   ALT 33 10/24/2012 1808   ALKPHOS 177* 12/02/2012 1125   ALKPHOS 155* 10/24/2012 1808   BILITOT 0.38 12/02/2012 1125   BILITOT 0.2* 10/24/2012 1808   GFRNONAA 43* 10/24/2012 1808   GFRAA 49* 10/24/2012 1808     Impression: N/V unclear etiology  Plan:  Continue radiotherapy as planned. CBC and BMP obtained, as above.  Relatively stable, but vitals show lower BP and higher pulse than usual in setting of poor PO intake.  Will give 500cc NS IVF today, and Dr Dayton Scrape will see her in a couple hours to discuss Decadron dosing - I told pt it's best to take Decadron with food, if she continues it.  -----------------------------------  Lonie Peak, MD

## 2013-01-23 NOTE — Telephone Encounter (Signed)
Received vm from pt's daughter, [name] who stated pt has had n/v over weekend and feels it is related to Decadron. Pt has medication for nausea but has not taken before taking steroid. Daughter's message requested pt be seen today for BP, assessment. Dr Dayton Scrape in surgery this morning, but pt will be seen in nursing and see Dr Basilio Cairo if needed.  Notified [name] RT on linac #3 to bring pt to nursing after radiation treatment today. [name] verbalized understanding.  3:06 pm Spoke w/daughter and updated her re: pt's labs and IVF today. Also informed her of pt's instructions, re: anti nausea med, Dr Rennie Plowman instructions to resume Decadron today. Informed pt's daughter that pt has all her instructions in written form. Ms [name] verbalized appreciation and understanding.

## 2013-01-24 ENCOUNTER — Ambulatory Visit
Admission: RE | Admit: 2013-01-24 | Discharge: 2013-01-24 | Disposition: A | Discharge status: Home / Self Care | Payer: Medicare Other | Source: Ambulatory Visit | Attending: Radiation Oncology | Admitting: Radiation Oncology

## 2013-01-24 ENCOUNTER — Encounter: Payer: Self-pay | Admitting: Radiation Oncology

## 2013-01-24 VITALS — BP 113/62 | HR 103 | Temp 97.8°F | Resp 20

## 2013-01-24 DIAGNOSIS — C7931 Secondary malignant neoplasm of brain: Secondary | ICD-10-CM

## 2013-01-24 NOTE — Progress Notes (Signed)
Pt denies pain, HA, nausea. She states she felt much better last night after receiving IVF yesterday. She ate soup and half a sandwich last night and again today. Pt states she did have some nausea this morning, no vomiting. Pt has been taking antiemetic med regularly, also took Ativan this morning w/good relief. Pt states she began taking Decadron w/snack as Dr Dayton Scrape instructed her yesterday. Pt states she is trying to drink more fluids.

## 2013-01-24 NOTE — Progress Notes (Signed)
Weekly Management Note:  Site: Whole brain Current Dose:  1500  cGy Projected Dose: 3000  cGy  Narrative: The patient is seen today for routine under treatment assessment. CBCT/MVCT images/port films were reviewed. The chart was reviewed.   She was seen yesterday by Dr. Basilio Cairo for dehydration. She stopped her dexamethasone for day thinking that this was causing her nausea. She was given 500 cc of fluid yesterday and feels better. She is back on her dexamethasone, 4 mg by mouth 3 times a day. She is slightly orthostatic. She has no drinking fluids. Physical Examination:  Filed Vitals:   01/24/13 1121  BP: 113/62  Pulse: 103  Temp:   Resp: 20  .  Weight:  . No change. She looks improved. No change.  Impression: Tolerating radiation therapy well, however she is mildly dehydrated. She is able to drink fluids I encouraged her to increase her fluid intake with salt.  Plan: Continue radiation therapy as planned.

## 2013-01-25 ENCOUNTER — Ambulatory Visit
Admission: RE | Admit: 2013-01-25 | Discharge: 2013-01-25 | Disposition: A | Discharge status: Home / Self Care | Payer: Medicare Other | Source: Ambulatory Visit | Attending: Radiation Oncology | Admitting: Radiation Oncology

## 2013-01-25 ENCOUNTER — Encounter: Payer: Medicare Other | Admitting: Radiation Oncology

## 2013-01-26 ENCOUNTER — Ambulatory Visit
Admission: RE | Admit: 2013-01-26 | Discharge: 2013-01-26 | Disposition: A | Discharge status: Home / Self Care | Payer: Medicare Other | Source: Ambulatory Visit | Attending: Radiation Oncology | Admitting: Radiation Oncology

## 2013-01-27 ENCOUNTER — Ambulatory Visit
Admission: RE | Admit: 2013-01-27 | Discharge: 2013-01-27 | Disposition: A | Discharge status: Home / Self Care | Payer: Medicare Other | Source: Ambulatory Visit | Attending: Radiation Oncology | Admitting: Radiation Oncology

## 2013-01-27 NOTE — Progress Notes (Signed)
Pt in nursing after receiving radiation today. She is c/o "burping". Pt inquiring if she can take Prevacid twice daily. Spoke w/Dr Basilio Cairo; per Dr Basilio Cairo pt to try Tums for relief today. If no relief pt is to take Gas-Ex tomorrow. If no relief she may increase Prevacid to BID on Sunday. Pt will see Dr Dayton Scrape on 01/30/13 for weekly put visit. Will reassess her c/o at that time. Relayed Dr Colletta [place] recommendations to pt and daughter. Wrote down OTC medications for pt. Daughter states she will take pt to purchase these today. Pt repeated and verbalized understanding of instructions.  Pt states she is eating well, drinking Ensure. She states she has stopped taking Allopurinol, still having some diarrhea in small amts. She states she has hemorrhoids, but has ointment to apply. She is taking Imodium prn. She states she is taking Decadron 4 mg 3 times a day. Pt has gained 1.1 lbs in past 4 days. Pt ambulating w/cane today.

## 2013-01-30 ENCOUNTER — Ambulatory Visit
Admission: RE | Admit: 2013-01-30 | Discharge: 2013-01-30 | Disposition: A | Discharge status: Home / Self Care | Payer: Medicare Other | Source: Ambulatory Visit | Attending: Radiation Oncology | Admitting: Radiation Oncology

## 2013-01-30 ENCOUNTER — Encounter: Payer: Self-pay | Admitting: Radiation Oncology

## 2013-01-30 ENCOUNTER — Other Ambulatory Visit: Payer: Self-pay | Admitting: *Deleted

## 2013-01-30 ENCOUNTER — Other Ambulatory Visit: Payer: Self-pay | Admitting: Radiation Oncology

## 2013-01-30 ENCOUNTER — Encounter: Payer: Self-pay | Admitting: *Deleted

## 2013-01-30 VITALS — BP 115/68 | HR 93 | Temp 98.3°F | Resp 20 | Wt 124.0 lb

## 2013-01-30 DIAGNOSIS — B370 Candidal stomatitis: Secondary | ICD-10-CM

## 2013-01-30 DIAGNOSIS — C7931 Secondary malignant neoplasm of brain: Secondary | ICD-10-CM

## 2013-01-30 MED ORDER — NYSTATIN 100000 UNIT/ML MT SUSP: 500000.0000 [IU] | Freq: Four times a day (QID) | OROMUCOSAL | Status: DC

## 2013-01-30 NOTE — Progress Notes (Signed)
Pt states she "had a really good weekend, ate well". She states she did have one episode of nausea, took med w/relief. She is taking med for nausea regularly. She states her diarrhea is under control, Tums is relieving her burping issue. She states today her legs "feel heavy and weak".  Pt taking Decadron 4 mg TID, c/o "feeling like there's something in my throat" and slightly sore throat. Pt removed upper denture, appears to have yeast infection on upper palate and upper back corners of mouth.  Pt denies pain, HA, nausea, vision changes this morning. Pt will complete treatment tomorrow.

## 2013-01-30 NOTE — Progress Notes (Signed)
[facility] Clinical Social Work  Clinical Social Work received phone call from patient's daughter, Daivd Council, requesting guidance on home support.  [name] states that patient is "not doing well" and has recently had difficulty walking.  [name] is requesting a walker and unsure of how to put in request.  CSW recommended [facility] do full home assessment to determine any other equipment needs.  CSW left message with Judeth Cornfield, RN to place [facility] assessment/DME order.  Patient's daughter shared that she is staying with her mother, but is concerned Ms. Rambeau needs more support.  Patient's daughter questioned whether Hospice services are appropriate at this time.  CSW encouraged patient's daughter to discuss with Dr. Dayton Scrape and Dr. Arbutus Ped at appointments this week.  CSW notified Dr. Dayton Scrape of patient/family call today.  Kathrin Penner, MSW, LCSW Clinical Social Worker [facility] [phone]

## 2013-01-30 NOTE — Progress Notes (Signed)
Weekly Management Note:  Site: Whole brain Current Dose:  2700  cGy Projected Dose: 3000  cGy  Narrative: The patient is seen today for routine under treatment assessment. CBCT/MVCT images/port films were reviewed. The chart was reviewed.   She mentions having mouth and throat irritation today and trouble getting up from a sitting position. She denies headaches. She does have some nausea but no vomiting when taking Zofran 3 times a day. She also takes lorazepam when necessary. She is scheduled for CT scans through Dr. Arbutus Ped this Wednesday and then a followup visit with Dr. Arbutus Ped on Thursday. She is currently on 4 mg of dexamethasone by mouth 3 times a day. She will finish her radiation therapy tomorrow.  Physical Examination:  Filed Vitals:   01/30/13 1052  BP: 115/68  Pulse: 93  Temp: 98.3 F (36.8 C)  Resp: 20  .  Weight: 124 lb (56.246 kg). On inspection oral cavity there is patchy candidiasis. She does have slight weakness of her hip flexors bilaterally.  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 I will leave her at her current dosage of dexamethasone at 4 mg by mouth 3 times a day, with further tapering as prescribed by Dr. Arbutus Ped he will see her this Thursday. I am starting nystatin swish and swallow 4 times a day for 9 days for her candidiasis.  Plan: Continue radiation therapy as planned. She will finish her radiation therapy tomorrow.

## 2013-01-30 NOTE — Progress Notes (Signed)
[name], social worker spoke with pt's family, they are requesting walker for pt.  Pt appears to need further evaluation for other services/ equipment needed in the home.  Walker ordered for patient, PT/ OT consult and tx call ordered SLJ

## 2013-01-31 ENCOUNTER — Encounter: Payer: Self-pay | Admitting: Radiation Oncology

## 2013-01-31 ENCOUNTER — Ambulatory Visit
Admission: RE | Admit: 2013-01-31 | Discharge: 2013-01-31 | Disposition: A | Discharge status: Home / Self Care | Payer: Medicare Other | Source: Ambulatory Visit | Attending: Radiation Oncology | Admitting: Radiation Oncology

## 2013-01-31 ENCOUNTER — Ambulatory Visit: Payer: Self-pay | Admitting: Radiation Oncology

## 2013-01-31 NOTE — Progress Notes (Addendum)
[facility] Radiation Oncology End of Treatment Note  Name:[name]  Date: 01/31/2013 ZOX:[id number] DOB:March 03, 1929   Status:outpatient    CC: [name], MD  Dr. Si Gaul  REFERRING PHYSICIAN:  Dr. Si Gaul   DIAGNOSIS:  Metastatic non-small cell carcinoma along to brain  INDICATION FOR TREATMENT: Palliative   TREATMENT DATES: 01/19/2003 through 01/31/2013                          SITE/DOSE: Whole brain, 3000 cGy in 10 sessions                           BEAMS/ENERGY:    6 MV photons parallel opposed lateral fields               NARRATIVE:   [name] a difficult time during her course of therapy. She initially had difficulty with nausea and vomiting which improved with increasing her dexamethasone to 4 mg by mouth 3 times a day and use of ondansetron every 8 hours. 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 She had extensive metastatic disease, particularly along the occipital brain.                         PLAN: Routine followup in one month. Patient instructed to call if questions or worsening complaints in interim. She'll see Dr. Shirline Frees later this week for review and perhaps tapering of her dexamethasone.

## 2013-02-01 ENCOUNTER — Ambulatory Visit (HOSPITAL_COMMUNITY)
Admission: RE | Admit: 2013-02-01 | Discharge: 2013-02-01 | Disposition: A | Discharge status: Home / Self Care | Payer: Medicare Other | Source: Ambulatory Visit | Attending: Internal Medicine | Admitting: Internal Medicine

## 2013-02-01 ENCOUNTER — Telehealth: Payer: Self-pay | Admitting: Medical Oncology

## 2013-02-01 ENCOUNTER — Inpatient Hospital Stay (HOSPITAL_COMMUNITY)
Admission: EM | Admit: 2013-02-01 | Discharge: 2013-02-03 | DRG: 181 | Disposition: A | Discharge status: Hospice | Payer: Medicare Other | Attending: Internal Medicine | Admitting: Internal Medicine

## 2013-02-01 ENCOUNTER — Encounter (HOSPITAL_COMMUNITY): Payer: Self-pay

## 2013-02-01 ENCOUNTER — Encounter (HOSPITAL_COMMUNITY): Payer: Self-pay | Admitting: Emergency Medicine

## 2013-02-01 ENCOUNTER — Other Ambulatory Visit (HOSPITAL_BASED_OUTPATIENT_CLINIC_OR_DEPARTMENT_OTHER): Payer: Medicare Other | Admitting: Lab

## 2013-02-01 ENCOUNTER — Other Ambulatory Visit: Payer: Self-pay | Admitting: Internal Medicine

## 2013-02-01 DIAGNOSIS — C7931 Secondary malignant neoplasm of brain: Secondary | ICD-10-CM | POA: Diagnosis present

## 2013-02-01 DIAGNOSIS — M109 Gout, unspecified: Secondary | ICD-10-CM | POA: Diagnosis present

## 2013-02-01 DIAGNOSIS — R5381 Other malaise: Secondary | ICD-10-CM

## 2013-02-01 DIAGNOSIS — R768 Other specified abnormal immunological findings in serum: Secondary | ICD-10-CM

## 2013-02-01 DIAGNOSIS — J449 Chronic obstructive pulmonary disease, unspecified: Secondary | ICD-10-CM | POA: Diagnosis present

## 2013-02-01 DIAGNOSIS — C3490 Malignant neoplasm of unspecified part of unspecified bronchus or lung: Secondary | ICD-10-CM | POA: Insufficient documentation

## 2013-02-01 DIAGNOSIS — K5641 Fecal impaction: Secondary | ICD-10-CM | POA: Diagnosis present

## 2013-02-01 DIAGNOSIS — K649 Unspecified hemorrhoids: Secondary | ICD-10-CM

## 2013-02-01 DIAGNOSIS — N390 Urinary tract infection, site not specified: Secondary | ICD-10-CM | POA: Diagnosis present

## 2013-02-01 DIAGNOSIS — C787 Secondary malignant neoplasm of liver and intrahepatic bile duct: Secondary | ICD-10-CM | POA: Insufficient documentation

## 2013-02-01 DIAGNOSIS — I10 Essential (primary) hypertension: Secondary | ICD-10-CM | POA: Diagnosis present

## 2013-02-01 DIAGNOSIS — Z923 Personal history of irradiation: Secondary | ICD-10-CM

## 2013-02-01 DIAGNOSIS — D739 Disease of spleen, unspecified: Secondary | ICD-10-CM | POA: Insufficient documentation

## 2013-02-01 DIAGNOSIS — Z66 Do not resuscitate: Secondary | ICD-10-CM | POA: Diagnosis present

## 2013-02-01 DIAGNOSIS — K5900 Constipation, unspecified: Secondary | ICD-10-CM

## 2013-02-01 DIAGNOSIS — R109 Unspecified abdominal pain: Secondary | ICD-10-CM

## 2013-02-01 DIAGNOSIS — B1920 Unspecified viral hepatitis C without hepatic coma: Secondary | ICD-10-CM

## 2013-02-01 DIAGNOSIS — R894 Abnormal immunological findings in specimens from other organs, systems and tissues: Secondary | ICD-10-CM

## 2013-02-01 DIAGNOSIS — Z515 Encounter for palliative care: Secondary | ICD-10-CM

## 2013-02-01 DIAGNOSIS — C799 Secondary malignant neoplasm of unspecified site: Secondary | ICD-10-CM

## 2013-02-01 DIAGNOSIS — C7889 Secondary malignant neoplasm of other digestive organs: Secondary | ICD-10-CM | POA: Diagnosis present

## 2013-02-01 DIAGNOSIS — J4489 Other specified chronic obstructive pulmonary disease: Secondary | ICD-10-CM | POA: Diagnosis present

## 2013-02-01 DIAGNOSIS — E785 Hyperlipidemia, unspecified: Secondary | ICD-10-CM | POA: Diagnosis present

## 2013-02-01 DIAGNOSIS — Z8673 Personal history of transient ischemic attack (TIA), and cerebral infarction without residual deficits: Secondary | ICD-10-CM

## 2013-02-01 DIAGNOSIS — C801 Malignant (primary) neoplasm, unspecified: Secondary | ICD-10-CM

## 2013-02-01 DIAGNOSIS — R531 Weakness: Secondary | ICD-10-CM

## 2013-02-01 DIAGNOSIS — R599 Enlarged lymph nodes, unspecified: Secondary | ICD-10-CM | POA: Insufficient documentation

## 2013-02-01 DIAGNOSIS — Z96659 Presence of unspecified artificial knee joint: Secondary | ICD-10-CM

## 2013-02-01 DIAGNOSIS — D3500 Benign neoplasm of unspecified adrenal gland: Secondary | ICD-10-CM | POA: Insufficient documentation

## 2013-02-01 DIAGNOSIS — I700 Atherosclerosis of aorta: Secondary | ICD-10-CM | POA: Insufficient documentation

## 2013-02-01 DIAGNOSIS — K625 Hemorrhage of anus and rectum: Secondary | ICD-10-CM

## 2013-02-01 DIAGNOSIS — R339 Retention of urine, unspecified: Secondary | ICD-10-CM | POA: Diagnosis present

## 2013-02-01 DIAGNOSIS — K644 Residual hemorrhoidal skin tags: Secondary | ICD-10-CM | POA: Diagnosis present

## 2013-02-01 DIAGNOSIS — N281 Cyst of kidney, acquired: Secondary | ICD-10-CM | POA: Insufficient documentation

## 2013-02-01 DIAGNOSIS — Z9221 Personal history of antineoplastic chemotherapy: Secondary | ICD-10-CM

## 2013-02-01 LAB — OCCULT BLOOD, POC DEVICE: Fecal Occult Bld: POSITIVE — AB

## 2013-02-01 LAB — CBC WITH DIFFERENTIAL/PLATELET
BASO%: 0.4 % (ref 0.0–2.0)
Eosinophils Absolute: 0 10*3/uL (ref 0.0–0.5)
HCT: 30.3 % — ABNORMAL LOW (ref 34.8–46.6)
LYMPH%: 1.3 % — ABNORMAL LOW (ref 14.0–49.7)
MCH: 31.5 pg (ref 25.1–34.0)
MCHC: 32.2 g/dL (ref 31.5–36.0)
MCV: 97.8 fL (ref 79.5–101.0)
MONO%: 3.7 % (ref 0.0–14.0)
NEUT#: 7.5 10*3/uL — ABNORMAL HIGH (ref 1.5–6.5)
NEUT%: 94.6 % — ABNORMAL HIGH (ref 38.4–76.8)
Platelets: 82 10*3/uL — ABNORMAL LOW (ref 145–400)
RBC: 3.1 10*6/uL — ABNORMAL LOW (ref 3.70–5.45)

## 2013-02-01 LAB — COMPREHENSIVE METABOLIC PANEL (CC13)
ALT: 266 U/L — ABNORMAL HIGH (ref 0–55)
Alkaline Phosphatase: 1352 U/L — ABNORMAL HIGH (ref 40–150)
Anion Gap: 12 mEq/L — ABNORMAL HIGH (ref 3–11)
Calcium: 8.4 mg/dL (ref 8.4–10.4)
Creatinine: 1.3 mg/dL — ABNORMAL HIGH (ref 0.6–1.1)
Glucose: 98 mg/dl (ref 70–140)
Sodium: 138 mEq/L (ref 136–145)
Total Bilirubin: 2.14 mg/dL — ABNORMAL HIGH (ref 0.20–1.20)
Total Protein: 5.5 g/dL — ABNORMAL LOW (ref 6.4–8.3)

## 2013-02-01 LAB — CBC
HCT: 30.2 % — ABNORMAL LOW (ref 36.0–46.0)
Hemoglobin: 9.8 g/dL — ABNORMAL LOW (ref 12.0–15.0)
MCH: 30.9 pg (ref 26.0–34.0)
MCHC: 32.5 g/dL (ref 30.0–36.0)

## 2013-02-01 LAB — URINALYSIS, ROUTINE W REFLEX MICROSCOPIC
Glucose, UA: NEGATIVE mg/dL
Ketones, ur: NEGATIVE mg/dL
Nitrite: POSITIVE — AB
Protein, ur: NEGATIVE mg/dL
Urobilinogen, UA: 1 mg/dL (ref 0.0–1.0)
pH: 5 (ref 5.0–8.0)

## 2013-02-01 LAB — PROTIME-INR
INR: 1.28 (ref 0.00–1.49)
Prothrombin Time: 15.7 seconds — ABNORMAL HIGH (ref 11.6–15.2)

## 2013-02-01 LAB — CREATININE, SERUM: GFR calc non Af Amer: 37 mL/min — ABNORMAL LOW (ref >90)

## 2013-02-01 LAB — APTT: aPTT: 28 seconds (ref 24–37)

## 2013-02-01 MED ORDER — CEFTRIAXONE SODIUM 1 G IJ SOLR
1.0000 g | Freq: Every day | INTRAMUSCULAR | Status: DC
  Administered 2013-02-02 (×2): 1 g via INTRAVENOUS
  Filled 2013-02-01 (×2): qty 10

## 2013-02-01 MED ORDER — ONDANSETRON HCL 4 MG/2ML IJ SOLN
4.0000 mg | Freq: Once | INTRAMUSCULAR | Status: AC
  Administered 2013-02-01: 4 mg via INTRAVENOUS
  Filled 2013-02-01: qty 2

## 2013-02-01 MED ORDER — ONDANSETRON HCL 4 MG/2ML IJ SOLN: 8.0000 mg | Freq: Three times a day (TID) | INTRAMUSCULAR | Status: DC | PRN

## 2013-02-01 MED ORDER — HYDROCORTISONE 2.5 % RE CREA
TOPICAL_CREAM | Freq: Three times a day (TID) | RECTAL | Status: DC
  Administered 2013-02-01 – 2013-02-03 (×4): via RECTAL
  Filled 2013-02-01: qty 28.35

## 2013-02-01 MED ORDER — FLEET ENEMA 7-19 GM/118ML RE ENEM: 1.0000 | ENEMA | Freq: Once | RECTAL | Status: AC | PRN

## 2013-02-01 MED ORDER — ONDANSETRON 8 MG/NS 50 ML IVPB
8.0000 mg | Freq: Three times a day (TID) | INTRAVENOUS | Status: DC | PRN
  Administered 2013-02-01 – 2013-02-02 (×2): 8 mg via INTRAVENOUS
  Filled 2013-02-01 (×2): qty 8

## 2013-02-01 MED ORDER — SORBITOL 70 % SOLN
30.0000 mL | Freq: Every day | Status: DC | PRN
  Administered 2013-02-01: 30 mL via ORAL
  Filled 2013-02-01: qty 30

## 2013-02-01 MED ORDER — POLYETHYLENE GLYCOL 3350 17 G PO PACK
17.0000 g | PACK | Freq: Every day | ORAL | Status: DC | PRN
  Filled 2013-02-01: qty 1

## 2013-02-01 MED ORDER — LORAZEPAM 0.5 MG PO TABS
0.5000 mg | ORAL_TABLET | Freq: Three times a day (TID) | ORAL | Status: DC | PRN
  Administered 2013-02-02: 0.5 mg via ORAL
  Filled 2013-02-01: qty 1

## 2013-02-01 MED ORDER — DOCUSATE SODIUM 100 MG PO CAPS
100.0000 mg | ORAL_CAPSULE | Freq: Two times a day (BID) | ORAL | Status: DC
  Administered 2013-02-01 – 2013-02-03 (×3): 100 mg via ORAL
  Filled 2013-02-01 (×5): qty 1

## 2013-02-01 MED ORDER — NYSTATIN 100000 UNIT/ML MT SUSP
5.0000 mL | Freq: Four times a day (QID) | OROMUCOSAL | Status: DC
  Administered 2013-02-01 – 2013-02-02 (×5): 500000 [IU] via ORAL
  Filled 2013-02-01 (×9): qty 5

## 2013-02-01 MED ORDER — SENNA 8.6 MG PO TABS
1.0000 | ORAL_TABLET | Freq: Two times a day (BID) | ORAL | Status: DC
  Administered 2013-02-01 – 2013-02-03 (×3): 8.6 mg via ORAL
  Filled 2013-02-01 (×4): qty 1

## 2013-02-01 MED ORDER — SODIUM CHLORIDE 0.9 % IV BOLUS (SEPSIS)
500.0000 mL | Freq: Once | INTRAVENOUS | Status: AC
  Administered 2013-02-01: 500 mL via INTRAVENOUS

## 2013-02-01 MED ORDER — FENTANYL CITRATE 0.05 MG/ML IJ SOLN
50.0000 ug | Freq: Once | INTRAMUSCULAR | Status: AC
  Administered 2013-02-01: 50 ug via INTRAVENOUS
  Filled 2013-02-01: qty 2

## 2013-02-01 MED ORDER — HEPARIN SODIUM (PORCINE) 5000 UNIT/ML IJ SOLN
5000.0000 [IU] | Freq: Three times a day (TID) | INTRAMUSCULAR | Status: DC
  Administered 2013-02-01: 5000 [IU] via SUBCUTANEOUS
  Filled 2013-02-01 (×6): qty 1

## 2013-02-01 MED ORDER — DOCUSATE SODIUM 100 MG PO CAPS: 100.0000 mg | ORAL_CAPSULE | Freq: Two times a day (BID) | ORAL | Status: DC | PRN

## 2013-02-01 MED ORDER — SODIUM CHLORIDE 0.9 % IV SOLN
INTRAVENOUS | Status: DC
  Administered 2013-02-01 – 2013-02-02 (×2): via INTRAVENOUS

## 2013-02-01 MED ORDER — ONDANSETRON HCL 4 MG/2ML IJ SOLN
8.0000 mg | Freq: Three times a day (TID) | INTRAMUSCULAR | Status: DC | PRN
  Filled 2013-02-01: qty 4

## 2013-02-01 MED ORDER — LIDOCAINE HCL 2 % EX GEL
Freq: Once | CUTANEOUS | Status: AC
  Administered 2013-02-01: 10
  Filled 2013-02-01: qty 10

## 2013-02-01 MED ORDER — MORPHINE SULFATE 2 MG/ML IJ SOLN
1.0000 mg | INTRAMUSCULAR | Status: DC | PRN
  Administered 2013-02-01 – 2013-02-02 (×3): 1 mg via INTRAVENOUS
  Filled 2013-02-01 (×3): qty 1

## 2013-02-01 NOTE — Telephone Encounter (Signed)
Pt reports significant hemmorhoids causing pain and discomfort. She also reports RUQ abd pain and neck pain. Per pts daughter pt is "in agony". I called mark back adn per [facility] pt needs to go to ED.

## 2013-02-01 NOTE — Progress Notes (Signed)
ANTIBIOTIC CONSULT NOTE - INITIAL  Pharmacy Consult for Ceftriaxone Indication: UTI  Allergies  Allergen Reactions  . Shellfish Allergy Anaphylaxis and Hives    NO betadine or iodine allergy  . Wellbutrin [Bupropion] Other (See Comments)    Shaking all over   . Ibuprofen Nausea Only  . Septra Ds [Sulfamethoxazole-Tmp Ds] Nausea And Vomiting  . Statins Other (See Comments)    Severe joint pains  . Tape     Adhesive only. (Paper tape OK)  . Taxotere [Docetaxel]     "hot, face, neck and chest red" On 09/26/12 pt rechallenged w/ Taxotere & she tolerated infusion.  VSS throughout infusion.    Patient Measurements: Height: 5\' 1"  (154.9 cm) Weight: 125 lb 7.1 oz (56.9 kg) IBW/kg (Calculated) : 47.8 Adjusted Body Weight:   Vital Signs: Temp: 98.5 F (36.9 C) (10/15 1630) Temp src: Oral (10/15 1630) BP: 80/40 mmHg (10/15 2157) Pulse Rate: 102 (10/15 1630) Intake/Output from previous day:   Intake/Output from this shift: Total I/O In: -  Out: 500 [Urine:500]  Labs:  Recent Labs  02/01/13 0825 02/01/13 0825 02/01/13 1526  WBC 7.9  --  5.2  HGB 9.8*  --  9.8*  PLT 82*  --  81*  CREATININE  --  1.3* 1.29*   Estimated Creatinine Clearance: 25.4 ml/min (by C-G formula based on Cr of 1.29). No results found for this basename: VANCOTROUGH, VANCOPEAK, VANCORANDOM, GENTTROUGH, GENTPEAK, GENTRANDOM, TOBRATROUGH, TOBRAPEAK, TOBRARND, AMIKACINPEAK, AMIKACINTROU, AMIKACIN,  in the last 72 hours   Microbiology: No results found for this or any previous visit (from the past 720 hour(s)).  Medical History: Past Medical History  Diagnosis Date  . Cervical spondylosis   . Renal cyst     simple bilateral 2008  . Diverticulosis   . Vertigo, benign positional   . Hyperlipidemia   . Aortic sclerosis     by exam,  . Erosive osteoarthritis of hand   . History of calcium pyrophosphate deposition disease (CPPD)   . Gout     vs pseudo gout flares in past  . Mediastinal adenopathy    . Tobacco use   . COPD, mild   . Glaucoma   . Hypertension   . Osteopenia   . Hiatal hernia   . Hemorrhoids   . H/O urinary incontinence   . Childhood asthma   . Bronchitis   . TIA (transient ischemic attack)   . Claustrophobia   . Dizziness   . Bleeding from the nose     hx of severe nose bleeds-last time was 2 yrs ago, slight bleed last week  . lung ca 11/2011  . Allergy   . Lung cancer 11/26/2012    right lung  . History of radiation therapy 12/15/12- 12/29/12    right lung/mediastinum  . Metastasis to brain 01/11/13    lung primary    Medications:  Anti-infectives   Start     Dose/Rate Route Frequency Ordered Stop   02/01/13 2315  cefTRIAXone (ROCEPHIN) 1 g in dextrose 5 % 50 mL IVPB     1 g 100 mL/hr over 30 Minutes Intravenous Daily at bedtime 02/01/13 2307       Assessment: Patient with UTI.    Goal of Therapy:  Ceftriaxone dosed based on patient weight and renal function   Plan:  Ceftiaxone 1gm iv q24hr--changes in dose not needed If you need any further dosing assistance, please re-contact pharmacy thanks  [name] 02/01/2013,11:44 PM

## 2013-02-01 NOTE — ED Provider Notes (Signed)
Medical screening examination/treatment/procedure(s) were conducted as a shared visit with non-physician practitioner(s) and myself.  I personally evaluated the patient during the encounter.   Patient seen and examined for abdominal pain and rectal bleeding. Patient had outpatient blood work and CT scans performed for followup of her cancer. Reviewing his records reveals significant elevation of liver function tests secondary to widely spread metastatic cancer. Patient experiencing generalized weakness, cannot ambulate. Will require hospitalization for further management of her acutely metastatic cancer.  Gilda Crease, MD 02/01/13 [phone]

## 2013-02-01 NOTE — Progress Notes (Signed)
NP on call notified about UA results. Rocephin per pharmacy ordered.

## 2013-02-01 NOTE — ED Notes (Signed)
Pt is ready for discharge, however pt's family left bedside for lunch. Pt will be d/c'd as soon as they return.

## 2013-02-01 NOTE — ED Notes (Signed)
Asked pt about urine, pt would prefer to try on her own before in & out cath attempted

## 2013-02-01 NOTE — H&P (Signed)
[facility] History and Physical  [name] ZOX:[id number] DOB: 1928-12-20 DOA: 02/01/2013  Referring physician: Emergency Department PCP: [name], MD  Specialists:   Chief Complaint: RUQ pain  HPI: [name] is a 79 y.o. female  With a hx of lung cancer who presents with RUQ pain, found to have multiple new metastatic disease. Hospitiast was consulted for further work up for new mets and for placement.  Review of Systems: Per above, remainder of 10pt ros unremarkable  Past Medical History  Diagnosis Date  . Cervical spondylosis   . Renal cyst     simple bilateral 2008  . Diverticulosis   . Vertigo, benign positional   . Hyperlipidemia   . Aortic sclerosis     by exam,  . Erosive osteoarthritis of hand   . History of calcium pyrophosphate deposition disease (CPPD)   . Gout     vs pseudo gout flares in past  . Mediastinal adenopathy   . Tobacco use   . COPD, mild   . Glaucoma   . Hypertension   . Osteopenia   . Hiatal hernia   . Hemorrhoids   . H/O urinary incontinence   . Childhood asthma   . Bronchitis   . TIA (transient ischemic attack)   . Claustrophobia   . Dizziness   . Bleeding from the nose     hx of severe nose bleeds-last time was 2 yrs ago, slight bleed last week  . lung ca 11/2011  . Allergy   . Lung cancer 11/26/2012    right lung  . History of radiation therapy 12/15/12- 12/29/12    right lung/mediastinum  . Metastasis to brain 01/11/13    lung primary   Past Surgical History  Procedure Laterality Date  . Cystoscopy, transurethral collagen injection therapy    . Left total knee arthroplasty    . Appendectomy    . Hemorrhoid surgery    . Cataract surgery x 2    . Eye surgery    . Tonsillectomy    . Abdominal hysterectomy    . Colonoscopy     Social History:  reports that she quit smoking about 9 years ago. Her smoking use included Cigarettes. She has a 70 pack-year smoking history. She has never used smokeless tobacco.  She reports that she drinks alcohol. She reports that she does not use illicit drugs.  where does patient live--home, ALF, SNF? and with whom if at home?  Can patient participate in ADLs?  Allergies  Allergen Reactions  . Shellfish Allergy Anaphylaxis and Hives    NO betadine or iodine allergy  . Wellbutrin [Bupropion] Other (See Comments)    Shaking all over   . Ibuprofen Nausea Only  . Septra Ds [Sulfamethoxazole-Tmp Ds] Nausea And Vomiting  . Statins Other (See Comments)    Severe joint pains  . Tape     Adhesive only. (Paper tape OK)  . Taxotere [Docetaxel]     "hot, face, neck and chest red" On 09/26/12 pt rechallenged w/ Taxotere & she tolerated infusion.  VSS throughout infusion.    Family History  Problem Relation Age of Onset  . Heart disease Father   . Cirrhosis Mother   . Cancer Daughter 41    breast, tx w/surgery    (be sure to complete)  Prior to Admission medications   Medication Sig Start Date End Date Taking? Authorizing Provider  acetaminophen (TYLENOL) 325 MG tablet Take 650 mg by mouth every 6 (six) hours as  needed for pain.   Yes Historical Provider, MD  Alum & Mag Hydroxide-Simeth (MAGIC MOUTHWASH) SOLN Take 5 mLs by mouth 4 (four) times daily as needed (for mouth).   Yes Historical Provider, MD  aspirin 81 MG tablet Take 81 mg by mouth every evening.    Yes Historical Provider, MD  calcium carbonate (TUMS - DOSED IN MG ELEMENTAL CALCIUM) 500 MG chewable tablet Chew 1 tablet by mouth daily.   Yes Historical Provider, MD  dexamethasone (DECADRON) 4 MG tablet Take 1 tablet (4 mg total) by mouth 2 (two) times daily with a meal. 01/12/13  Yes Billie Lade, MD  diltiazem 2 % GEL Apply 1 application topically 2 (two) times daily as needed (hemmorrhoids).    Yes Historical Provider, MD  estrogens, conjugated, (PREMARIN) 0.625 MG tablet Take 0.625 mg by mouth every morning.    Yes Historical Provider, MD  ipratropium (ATROVENT) 0.06 % nasal spray Place 2 sprays into  the nose 4 (four) times daily as needed for rhinitis.    Yes Historical Provider, MD  lansoprazole (PREVACID) 30 MG capsule Take 1 capsule (30 mg total) by mouth daily. 01/12/13  Yes Billie Lade, MD  LORazepam (ATIVAN) 0.5 MG tablet Take 0.5 mg by mouth every 8 (eight) hours as needed (for nausea).    Yes Historical Provider, MD  ondansetron (ZOFRAN-ODT) 8 MG disintegrating tablet Take 8 mg by mouth every 8 (eight) hours as needed for nausea.   Yes Historical Provider, MD  timolol (TIMOPTIC-XR) 0.5 % ophthalmic gel-forming Place 1 drop into the left eye 2 (two) times daily.    Yes Historical Provider, MD  ADVAIR DISKUS 250-50 MCG/DOSE AEPB Inhale 1 puff into the lungs daily as needed (shortness of breath).  12/14/11   Historical Provider, MD  docusate sodium (COLACE) 100 MG capsule Take 1 capsule (100 mg total) by mouth every 12 (twelve) hours as needed for constipation. 02/01/13   Trixie Dredge, PA-C   Physical Exam: Filed Vitals:   02/01/13 1013 02/01/13 1335  BP: 146/72 125/64  Pulse: 92 110  Temp: 97.6 F (36.4 C) 97.8 F (36.6 C)  TempSrc: Oral Oral  Resp: 18   Height: 5\' 1"  (1.549 m)   Weight: 56.246 kg (124 lb)   SpO2: 97% 99%     General:  Awake, in nad  Eyes: PERRL B  ENT: membranes moist, dentition fair  Neck: trachea midline, neck supple  Cardiovascular: regular, s1, s2  Respiratory: normal resp effort, no wheezing  Abdomen: soft, nondistended  Skin: normal skin turgor, no abnormal skin lesions seen  Musculoskeletal: perfused, no clubbing  Psychiatric: mood/affect normal // no auditory/visual hallucination  Neurologic: cn2-12 grossly intact, strength/sensation intact  Labs on Admission:  Basic Metabolic Panel:  Recent Labs Lab 02/01/13 0825  NA 138  K 4.5  CO2 23  GLUCOSE 98  BUN 47.8*  CREATININE 1.3*  CALCIUM 8.4   Liver Function Tests:  Recent Labs Lab 02/01/13 0825  AST 409*  ALT 266*  ALKPHOS 1,352 Repeated and Verified*  BILITOT 2.14*   PROT 5.5*  ALBUMIN 2.2*   No results found for this basename: LIPASE, AMYLASE,  in the last 168 hours No results found for this basename: AMMONIA,  in the last 168 hours CBC:  Recent Labs Lab 02/01/13 0825  WBC 7.9  NEUTROABS 7.5*  HGB 9.8*  HCT 30.3*  MCV 97.8  PLT 82*   Cardiac Enzymes: No results found for this basename: CKTOTAL, CKMB, CKMBINDEX, TROPONINI,  in  the last 168 hours  BNP (last 3 results) No results found for this basename: PROBNP,  in the last 8760 hours CBG: No results found for this basename: GLUCAP,  in the last 168 hours  Radiological Exams on Admission: Ct Abdomen Pelvis Wo Contrast  02/01/2013   CLINICAL DATA:  Lung cancer with brain metastasis.  EXAM: CT CHEST, ABDOMEN AND PELVIS WITHOUT CONTRAST  TECHNIQUE: Multidetector CT imaging of the chest, abdomen and pelvis was performed following the standard protocol without IV contrast.  COMPARISON:  Chest CT 11/25/2012 and CT abdomen/pelvis 06/27/2012.  FINDINGS: CT CHEST FINDINGS  No breast mass or axillary lymphadenopathy. Stable supraclavicular adenopathy. No new nodal lesions. The bony thorax is intact. No destructive bone lesions or spinal canal compromise.  The heart is normal in size. No pericardial effusion. Persistent bulky mediastinal and hilar lymphadenopathy. The the right peritracheal nodal lesion measures 2.4 x 1.8 cm and previously measured 3.3 x 2.6 cm. The aortic 0 pulmonary window nodal mass measures 3.6 x 2.6 cm and previously measured 3.8 x 2.5 cm. The subcarinal adenopathy has also decreased slightly in size.  Examination of the lung parenchyma demonstrates numerous new metastatic pulmonary nodules. The right lung mass is slightly smaller. The 8 previously measured a maximum of 8 cm and now measures 6.3 cm. No pleural effusion. Stable atherosclerotic calcifications involving the aorta and coronary arteries.  CT ABDOMEN AND PELVIS FINDINGS  New diffuse hepatic metastatic disease throughout the  liver. No biliary dilatation. The gallbladder is normal. No common bile duct dilatation. The pancreas is grossly normal. The splenic lesion has enlarged and new splenic lesions are noted. The adrenal glands and kidneys are unremarkable and stable. Small right adrenal gland adenoma and right renal cysts are noted.  The stomach, duodenum, small bowel and colon are grossly normal. No inflammatory changes, mass lesions or obstructive findings. Stable advanced atherosclerotic calcifications involving the aorta. Small scattered mesenteric and retroperitoneal lymph nodes but no mass or adenopathy.  Moderate air noted in the bladder, may be from recent instrumentation. No pelvic mass or adenopathy. Bony structures are intact. No definite destructive bone lesions  IMPRESSION: CT CHEST IMPRESSION  Slight interval decrease in size of the large right lung mass and slight decrease in size of the mediastinal and hilar lymph nodes. However, there are numerous new pulmonary metastasis.  Stable supraclavicular adenopathy.  CT ABDOMEN AND PELVIS IMPRESSION  New diffuse hepatic metastatic disease, enlarging left splenic lesion and new splenic lesions.   Electronically Signed   By: Loralie Champagne M.D.   On: 02/01/2013 11:17   Ct Chest Wo Contrast  02/01/2013   CLINICAL DATA:  Lung cancer with brain metastasis.  EXAM: CT CHEST, ABDOMEN AND PELVIS WITHOUT CONTRAST  TECHNIQUE: Multidetector CT imaging of the chest, abdomen and pelvis was performed following the standard protocol without IV contrast.  COMPARISON:  Chest CT 11/25/2012 and CT abdomen/pelvis 06/27/2012.  FINDINGS: CT CHEST FINDINGS  No breast mass or axillary lymphadenopathy. Stable supraclavicular adenopathy. No new nodal lesions. The bony thorax is intact. No destructive bone lesions or spinal canal compromise.  The heart is normal in size. No pericardial effusion. Persistent bulky mediastinal and hilar lymphadenopathy. The the right peritracheal nodal lesion measures  2.4 x 1.8 cm and previously measured 3.3 x 2.6 cm. The aortic 0 pulmonary window nodal mass measures 3.6 x 2.6 cm and previously measured 3.8 x 2.5 cm. The subcarinal adenopathy has also decreased slightly in size.  Examination of the lung parenchyma demonstrates numerous  new metastatic pulmonary nodules. The right lung mass is slightly smaller. The 8 previously measured a maximum of 8 cm and now measures 6.3 cm. No pleural effusion. Stable atherosclerotic calcifications involving the aorta and coronary arteries.  CT ABDOMEN AND PELVIS FINDINGS  New diffuse hepatic metastatic disease throughout the liver. No biliary dilatation. The gallbladder is normal. No common bile duct dilatation. The pancreas is grossly normal. The splenic lesion has enlarged and new splenic lesions are noted. The adrenal glands and kidneys are unremarkable and stable. Small right adrenal gland adenoma and right renal cysts are noted.  The stomach, duodenum, small bowel and colon are grossly normal. No inflammatory changes, mass lesions or obstructive findings. Stable advanced atherosclerotic calcifications involving the aorta. Small scattered mesenteric and retroperitoneal lymph nodes but no mass or adenopathy.  Moderate air noted in the bladder, may be from recent instrumentation. No pelvic mass or adenopathy. Bony structures are intact. No definite destructive bone lesions  IMPRESSION: CT CHEST IMPRESSION  Slight interval decrease in size of the large right lung mass and slight decrease in size of the mediastinal and hilar lymph nodes. However, there are numerous new pulmonary metastasis.  Stable supraclavicular adenopathy.  CT ABDOMEN AND PELVIS IMPRESSION  New diffuse hepatic metastatic disease, enlarging left splenic lesion and new splenic lesions.   Electronically Signed   By: Loralie Champagne M.D.   On: 02/01/2013 11:17    Assessment/Plan Active Problems:   Lung cancer   Hemorrhoids   Constipation   1. Constipation 1. Cont  with bowel regimen 2. Encourage PO fluids 2. Elevated LFT's 1. Markedly elevated from LFT's from 8/14. 2. Abd CT with evidence of new lesions 3. ED notes reviewed - Pt's primary oncologist notified of results 3. Hemorrhoids 1. Stool softeners as tolerated 4. Metastatic lung cancer 1. New metastatic lesions 2. Pt has an appt with her oncologist tomorrow 3. For now, will consult PT/OT given weakness 5. DVT prophylaxis 1. Heparin Subq  Code Status: Full (must indicate code status--if unknown or must be presumed, indicate so) Family Communication: Pt in room (indicate person spoken with, if applicable, with phone number if by telephone) Disposition Plan: Pending (indicate anticipated LOS)  Time spent:  Danell Vazquez K [facility] Pager [phone]  If 7PM-7AM, please contact night-coverage [email or web address] Password [email or web address] 02/01/2013, 3:03 PM

## 2013-02-01 NOTE — ED Provider Notes (Signed)
CSN: [id number]     Arrival date & time 02/01/13  5621 History   First MD Initiated Contact with Patient 02/01/13 1017     Chief Complaint  Patient presents with  . Hemorrhoids  . Generalized Body Aches   (Consider location/radiation/quality/duration/timing/severity/associated sxs/prior Treatment) The history is provided by the patient and a relative.   Pt with metastatic lung cancer presents with RUQ abdominal pain and external rectal pain from hemorrhoids.  States she hit her external hemorrhoids on the commode this morning and she has since had intense burning pain.  Pt also has sharp RUQ pain, "like a catch" without N/V.  Pt has had intermittent diarrhea, and has only had small bowel movements recently.  Denies urinary symptoms.  Denies fevers, chills, body aches, CP, SOB, cough.   Past Medical History  Diagnosis Date  . Cervical spondylosis   . Renal cyst     simple bilateral 2008  . Diverticulosis   . Vertigo, benign positional   . Hyperlipidemia   . Aortic sclerosis     by exam,  . Erosive osteoarthritis of hand   . History of calcium pyrophosphate deposition disease (CPPD)   . Gout     vs pseudo gout flares in past  . Mediastinal adenopathy   . Tobacco use   . COPD, mild   . Glaucoma   . Hypertension   . Osteopenia   . Hiatal hernia   . Hemorrhoids   . H/O urinary incontinence   . Childhood asthma   . Bronchitis   . TIA (transient ischemic attack)   . Claustrophobia   . Dizziness   . Bleeding from the nose     hx of severe nose bleeds-last time was 2 yrs ago, slight bleed last week  . lung ca 11/2011  . Allergy   . Lung cancer 11/26/2012    right lung  . History of radiation therapy 12/15/12- 12/29/12    right lung/mediastinum  . Metastasis to brain 01/11/13    lung primary   Past Surgical History  Procedure Laterality Date  . Cystoscopy, transurethral collagen injection therapy    . Left total knee arthroplasty    . Appendectomy    . Hemorrhoid surgery     . Cataract surgery x 2    . Eye surgery    . Tonsillectomy    . Abdominal hysterectomy    . Colonoscopy     Family History  Problem Relation Age of Onset  . Heart disease Father   . Cirrhosis Mother   . Cancer Daughter 40    breast, tx w/surgery   History  Substance Use Topics  . Smoking status: Former Smoker -- 2.00 packs/day for 35 years    Types: Cigarettes    Quit date: 04/21/2003  . Smokeless tobacco: Never Used  . Alcohol Use: Yes     Comment: rare wine   OB History   Grav Para Term Preterm Abortions TAB SAB Ect Mult Living                 Review of Systems  Constitutional: Negative for fever and chills.  Respiratory: Negative for cough and shortness of breath.   Cardiovascular: Negative for chest pain.  Gastrointestinal: Positive for abdominal pain, anal bleeding and rectal pain. Negative for nausea, vomiting and diarrhea.  Genitourinary: Negative for dysuria, urgency and frequency.  All other systems reviewed and are negative.    Allergies  Shellfish allergy; Wellbutrin; Ibuprofen; Septra ds; Statins; Tape; and Taxotere  Home Medications   Current Outpatient Rx  Name  Route  Sig  Dispense  Refill  . acetaminophen (TYLENOL) 325 MG tablet   Oral   Take 650 mg by mouth every 6 (six) hours as needed for pain.         Marland Kitchen Alum & Mag Hydroxide-Simeth (MAGIC MOUTHWASH) SOLN   Oral   Take 5 mLs by mouth 4 (four) times daily as needed (for mouth).         Marland Kitchen aspirin 81 MG tablet   Oral   Take 81 mg by mouth every evening.          . calcium carbonate (TUMS - DOSED IN MG ELEMENTAL CALCIUM) 500 MG chewable tablet   Oral   Chew 1 tablet by mouth daily.         Marland Kitchen dexamethasone (DECADRON) 4 MG tablet   Oral   Take 1 tablet (4 mg total) by mouth 2 (two) times daily with a meal.   30 tablet   0   . diltiazem 2 % GEL   Topical   Apply 1 application topically 2 (two) times daily as needed (hemmorrhoids).          . estrogens, conjugated, (PREMARIN)  0.625 MG tablet   Oral   Take 0.625 mg by mouth every morning.          Marland Kitchen ipratropium (ATROVENT) 0.06 % nasal spray   Nasal   Place 2 sprays into the nose 4 (four) times daily as needed for rhinitis.          Marland Kitchen lansoprazole (PREVACID) 30 MG capsule   Oral   Take 1 capsule (30 mg total) by mouth daily.   30 capsule   0   . LORazepam (ATIVAN) 0.5 MG tablet   Oral   Take 0.5 mg by mouth every 8 (eight) hours as needed (for nausea).          . ondansetron (ZOFRAN-ODT) 8 MG disintegrating tablet   Oral   Take 8 mg by mouth every 8 (eight) hours as needed for nausea.         . timolol (TIMOPTIC-XR) 0.5 % ophthalmic gel-forming   Left Eye   Place 1 drop into the left eye 2 (two) times daily.          Marland Kitchen ADVAIR DISKUS 250-50 MCG/DOSE AEPB   Inhalation   Inhale 1 puff into the lungs daily as needed (shortness of breath).           BP 146/72  Pulse 92  Temp(Src) 97.6 F (36.4 C) (Oral)  Resp 18  Ht 5\' 1"  (1.549 m)  Wt 124 lb (56.246 kg)  BMI 23.44 kg/m2  SpO2 97% Physical Exam  Nursing note and vitals reviewed. Constitutional: She appears well-developed and well-nourished. No distress.  HENT:  Head: Normocephalic and atraumatic.  Neck: Neck supple.  Cardiovascular: Normal rate and regular rhythm.   Pulmonary/Chest: Effort normal and breath sounds normal. No respiratory distress. She has no wheezes. She has no rales.  Abdominal: Soft. She exhibits no distension. There is tenderness in the right upper quadrant. There is no rebound and no guarding.  Genitourinary: Rectal exam shows external hemorrhoid (soft) and tenderness. Guaiac positive stool.  Large amount of stool in rectal vault  Neurological: She is alert.  Skin: She is not diaphoretic.    ED Course  Fecal disimpaction Date/Time: 02/01/2013 1:23 PM Performed by: Trixie Dredge Authorized by: Trixie Dredge Consent: Verbal consent obtained. Consent  given by: patient Patient understanding: patient states  understanding of the procedure being performed Patient identity confirmed: verbally with patient Local anesthesia used: yes Local anesthetic: viscous lidocaine PR. Patient sedated: no Patient tolerance: Patient tolerated the procedure well with no immediate complications. Comments: Moderate amount of stool disimpacted   (including critical care time) Labs Review Labs Reviewed  PROTIME-INR - Abnormal; Notable for the following:    Prothrombin Time 15.7 (*)    All other components within normal limits  OCCULT BLOOD, POC DEVICE - Abnormal; Notable for the following:    Fecal Occult Bld POSITIVE (*)    All other components within normal limits  APTT  URINALYSIS, ROUTINE W REFLEX MICROSCOPIC   Imaging Review Ct Abdomen Pelvis Wo Contrast  02/01/2013   CLINICAL DATA:  Lung cancer with brain metastasis.  EXAM: CT CHEST, ABDOMEN AND PELVIS WITHOUT CONTRAST  TECHNIQUE: Multidetector CT imaging of the chest, abdomen and pelvis was performed following the standard protocol without IV contrast.  COMPARISON:  Chest CT 11/25/2012 and CT abdomen/pelvis 06/27/2012.  FINDINGS: CT CHEST FINDINGS  No breast mass or axillary lymphadenopathy. Stable supraclavicular adenopathy. No new nodal lesions. The bony thorax is intact. No destructive bone lesions or spinal canal compromise.  The heart is normal in size. No pericardial effusion. Persistent bulky mediastinal and hilar lymphadenopathy. The the right peritracheal nodal lesion measures 2.4 x 1.8 cm and previously measured 3.3 x 2.6 cm. The aortic 0 pulmonary window nodal mass measures 3.6 x 2.6 cm and previously measured 3.8 x 2.5 cm. The subcarinal adenopathy has also decreased slightly in size.  Examination of the lung parenchyma demonstrates numerous new metastatic pulmonary nodules. The right lung mass is slightly smaller. The 8 previously measured a maximum of 8 cm and now measures 6.3 cm. No pleural effusion. Stable atherosclerotic calcifications involving  the aorta and coronary arteries.  CT ABDOMEN AND PELVIS FINDINGS  New diffuse hepatic metastatic disease throughout the liver. No biliary dilatation. The gallbladder is normal. No common bile duct dilatation. The pancreas is grossly normal. The splenic lesion has enlarged and new splenic lesions are noted. The adrenal glands and kidneys are unremarkable and stable. Small right adrenal gland adenoma and right renal cysts are noted.  The stomach, duodenum, small bowel and colon are grossly normal. No inflammatory changes, mass lesions or obstructive findings. Stable advanced atherosclerotic calcifications involving the aorta. Small scattered mesenteric and retroperitoneal lymph nodes but no mass or adenopathy.  Moderate air noted in the bladder, may be from recent instrumentation. No pelvic mass or adenopathy. Bony structures are intact. No definite destructive bone lesions  IMPRESSION: CT CHEST IMPRESSION  Slight interval decrease in size of the large right lung mass and slight decrease in size of the mediastinal and hilar lymph nodes. However, there are numerous new pulmonary metastasis.  Stable supraclavicular adenopathy.  CT ABDOMEN AND PELVIS IMPRESSION  New diffuse hepatic metastatic disease, enlarging left splenic lesion and new splenic lesions.   Electronically Signed   By: Loralie Champagne M.D.   On: 02/01/2013 11:17   Ct Chest Wo Contrast  02/01/2013   CLINICAL DATA:  Lung cancer with brain metastasis.  EXAM: CT CHEST, ABDOMEN AND PELVIS WITHOUT CONTRAST  TECHNIQUE: Multidetector CT imaging of the chest, abdomen and pelvis was performed following the standard protocol without IV contrast.  COMPARISON:  Chest CT 11/25/2012 and CT abdomen/pelvis 06/27/2012.  FINDINGS: CT CHEST FINDINGS  No breast mass or axillary lymphadenopathy. Stable supraclavicular adenopathy. No new nodal lesions. The bony  thorax is intact. No destructive bone lesions or spinal canal compromise.  The heart is normal in size. No  pericardial effusion. Persistent bulky mediastinal and hilar lymphadenopathy. The the right peritracheal nodal lesion measures 2.4 x 1.8 cm and previously measured 3.3 x 2.6 cm. The aortic 0 pulmonary window nodal mass measures 3.6 x 2.6 cm and previously measured 3.8 x 2.5 cm. The subcarinal adenopathy has also decreased slightly in size.  Examination of the lung parenchyma demonstrates numerous new metastatic pulmonary nodules. The right lung mass is slightly smaller. The 8 previously measured a maximum of 8 cm and now measures 6.3 cm. No pleural effusion. Stable atherosclerotic calcifications involving the aorta and coronary arteries.  CT ABDOMEN AND PELVIS FINDINGS  New diffuse hepatic metastatic disease throughout the liver. No biliary dilatation. The gallbladder is normal. No common bile duct dilatation. The pancreas is grossly normal. The splenic lesion has enlarged and new splenic lesions are noted. The adrenal glands and kidneys are unremarkable and stable. Small right adrenal gland adenoma and right renal cysts are noted.  The stomach, duodenum, small bowel and colon are grossly normal. No inflammatory changes, mass lesions or obstructive findings. Stable advanced atherosclerotic calcifications involving the aorta. Small scattered mesenteric and retroperitoneal lymph nodes but no mass or adenopathy.  Moderate air noted in the bladder, may be from recent instrumentation. No pelvic mass or adenopathy. Bony structures are intact. No definite destructive bone lesions  IMPRESSION: CT CHEST IMPRESSION  Slight interval decrease in size of the large right lung mass and slight decrease in size of the mediastinal and hilar lymph nodes. However, there are numerous new pulmonary metastasis.  Stable supraclavicular adenopathy.  CT ABDOMEN AND PELVIS IMPRESSION  New diffuse hepatic metastatic disease, enlarging left splenic lesion and new splenic lesions.   Electronically Signed   By: Loralie Champagne M.D.   On:  02/01/2013 11:17    EKG Interpretation   None      11:31 AM Discussed pt with Dr Blinda Leatherwood.  12:07 PM Discussed results with patient.  Pt made aware that she has new mets and this is likely the cause of her pain.  Pt has appt with Dr Shirline Frees tomorrow, will defer giving full details of results to him as this is the reason for the appointment and he will be able to discuss treatment plan at the same time.  I have paged Dr Shirline Frees to discuss.   12:39 PM Discussed pt with Dr Shirline Frees.  States he will deal with all of the new results tomorrow at their appointment and requested that I only deal with acute problems currently.   Pt disimpacted by me in ED.  States she has pain medication at home that works but she just doesn't use it frequently.  2:47 PM I spoke with patient's daughter who is the patient's primary care provider.  She is concerned because pt lives alone at home and has daughter staying with her currently.  Pt is unable to walk or assist with transfers and daughter is not strong enough to carry her.  States she dropped her on the toilet earlier today (this was not how this was related to me initially) which is why patient complained of hemorrhoid pain.  States she is unable to take the patient home.  I have discussed the patient with Dr Rhona Leavens who will admit the patient.   MDM   1. Metastatic cancer   2. Fecal impaction   3. Abdominal pain   4. Generalized weakness  5. Elevated LFTs    Pt with metastatic lung cancer, finished brain radiation yesterday, came in for CT scans and labs today, developed RUQ abdominal pain and rectal pain.  Per initial history, pt hit hemorrhoids on toilet and thought this was what was causing her pain - she was found to have mild fecal impaction, which I disimpacted in ED.  - her external hemorrhoids were soft, nontender.  The information that patient was actually dropped onto the toilet, causing pain in her hemorrhoids, was related to me after the  patient was dispositioned initially - hospitalist was made aware of the "drop" will defer further evaluation of this to him. However, I do believe the pain was rectal pain from impaction and not likely related to pelvic fracture.  Pt with significant elevation of her LFTs and RUQ tenderness, new mets seen on CT scan.  Pt is scheduled to follow up with Dr Shirline Frees tomorrow for discussion of results and creation of plan.  Given patient's extreme weakness and discomfort, pt to be admitted for observation overnight by hospitalist for patient safety and further care.      Trixie Dredge, PA-C 02/01/13 1545

## 2013-02-01 NOTE — ED Notes (Signed)
Per pt has hx of hemorrhoids with onset of bleeding to rectal area last night. Pt reports sharp pain to right side of abd since yesterday. Pt reports sitting on cease of toilet that caused bleeding to start. Pt also reports generalized aches to neck and shoulders x3 days.

## 2013-02-01 NOTE — Progress Notes (Signed)
Pt does not use a computer. Pt does not want to sign up for My Chart. Briscoe Burns BSN, RN-BC Admissions RN  02/01/2013 3:33 PM

## 2013-02-01 NOTE — ED Notes (Addendum)
Pt requests Zofran. PA notified and reports that she is on her way to see pt now.

## 2013-02-01 NOTE — Progress Notes (Signed)
Patient not taking oral fluids well. Orders for NS at 75 placed by NP. Unable to void spontaneously. Bladder scan >200. Order for in and out cath placed, 500cc out. BP 80/40 at 2200. Patient alert, not dizzy. Order for 500 cc over two hours placed.

## 2013-02-02 ENCOUNTER — Encounter: Payer: Self-pay | Admitting: Internal Medicine

## 2013-02-02 ENCOUNTER — Ambulatory Visit: Payer: Medicare Other | Admitting: Internal Medicine

## 2013-02-02 DIAGNOSIS — C342 Malignant neoplasm of middle lobe, bronchus or lung: Secondary | ICD-10-CM

## 2013-02-02 DIAGNOSIS — C787 Secondary malignant neoplasm of liver and intrahepatic bile duct: Secondary | ICD-10-CM

## 2013-02-02 DIAGNOSIS — R740 Nonspecific elevation of levels of transaminase and lactic acid dehydrogenase [LDH]: Secondary | ICD-10-CM

## 2013-02-02 DIAGNOSIS — R627 Adult failure to thrive: Secondary | ICD-10-CM

## 2013-02-02 DIAGNOSIS — N390 Urinary tract infection, site not specified: Secondary | ICD-10-CM | POA: Insufficient documentation

## 2013-02-02 DIAGNOSIS — C7931 Secondary malignant neoplasm of brain: Secondary | ICD-10-CM

## 2013-02-02 LAB — CBC
Hemoglobin: 8.5 g/dL — ABNORMAL LOW (ref 12.0–15.0)
Hemoglobin: 8.5 g/dL — ABNORMAL LOW (ref 12.0–15.0)
MCH: 31.1 pg (ref 26.0–34.0)
MCH: 31.4 pg (ref 26.0–34.0)
MCHC: 32.4 g/dL (ref 30.0–36.0)
MCV: 96 fL (ref 78.0–100.0)
MCV: 94.5 fL (ref 78.0–100.0)
Platelets: 57 10*3/uL — ABNORMAL LOW (ref 150–400)
Platelets: 47 10*3/uL — ABNORMAL LOW (ref 150–400)
RDW: 18.7 % — ABNORMAL HIGH (ref 11.5–15.5)
RDW: 18.9 % — ABNORMAL HIGH (ref 11.5–15.5)
WBC: 7.2 10*3/uL (ref 4.0–10.5)
WBC: 3.1 10*3/uL — ABNORMAL LOW (ref 4.0–10.5)

## 2013-02-02 LAB — COMPREHENSIVE METABOLIC PANEL
Albumin: 1.9 g/dL — ABNORMAL LOW (ref 3.5–5.2)
Alkaline Phosphatase: 1102 U/L — ABNORMAL HIGH (ref 39–117)
BUN: 58 mg/dL — ABNORMAL HIGH (ref 6–23)
Calcium: 7.9 mg/dL — ABNORMAL LOW (ref 8.4–10.5)
Chloride: 102 mEq/L (ref 96–112)
Potassium: 4.2 mEq/L (ref 3.5–5.1)
Total Bilirubin: 2.8 mg/dL — ABNORMAL HIGH (ref 0.3–1.2)
Total Protein: 4.8 g/dL — ABNORMAL LOW (ref 6.0–8.3)

## 2013-02-02 MED ORDER — MORPHINE SULFATE 2 MG/ML IJ SOLN
1.0000 mg | INTRAMUSCULAR | Status: DC | PRN
  Administered 2013-02-02 – 2013-02-03 (×6): 1 mg via INTRAVENOUS
  Filled 2013-02-02 (×6): qty 1

## 2013-02-02 MED ORDER — ENSURE COMPLETE PO LIQD
237.0000 mL | Freq: Three times a day (TID) | ORAL | Status: DC
  Administered 2013-02-02 – 2013-02-03 (×3): 237 mL via ORAL

## 2013-02-02 MED ORDER — SODIUM CHLORIDE 0.9 % IV SOLN: INTRAVENOUS | Status: AC

## 2013-02-02 MED ORDER — SODIUM CHLORIDE 0.9 % IV BOLUS (SEPSIS)
250.0000 mL | Freq: Once | INTRAVENOUS | Status: AC
  Administered 2013-02-02: 250 mL via INTRAVENOUS

## 2013-02-02 MED ORDER — FENTANYL 25 MCG/HR TD PT72
25.0000 ug | MEDICATED_PATCH | TRANSDERMAL | Status: DC
  Administered 2013-02-02: 25 ug via TRANSDERMAL
  Filled 2013-02-02: qty 1

## 2013-02-02 MED ORDER — HYDROCORTISONE ACETATE 25 MG RE SUPP
25.0000 mg | Freq: Two times a day (BID) | RECTAL | Status: DC
  Administered 2013-02-02 – 2013-02-03 (×3): 25 mg via RECTAL
  Filled 2013-02-02 (×4): qty 1

## 2013-02-02 NOTE — Care Management Note (Signed)
   CARE MANAGEMENT NOTE 02/02/2013  Patient:  [name], [name]   Account Number:  [id number]  Date Initiated:  02/02/2013  Documentation initiated by:  Nels Munn  Subjective/Objective Assessment:   79 yo female admitted with UTI and urinary retention. PCP: Dr. Marden Noble. PTA pt from home.     Action/Plan:   SNF   Anticipated DC Date:     Anticipated DC Plan:  SKILLED NURSING FACILITY  In-house referral  Clinical Social Worker      DC Associate Professor  CM consult      Choice offered to / List presented to:  NA   DME arranged  NA      DME agency  NA     HH arranged  NA      HH agency  NA   Status of service:  Completed, signed off Medicare Important Message given?   (If response is "NO", the following Medicare IM given date fields will be blank) Date Medicare IM given:   Date Additional Medicare IM given:    Discharge Disposition:    Per UR Regulation:  Reviewed for med. necessity/level of care/duration of stay  If discussed at Long Length of Stay Meetings, dates discussed:    Comments:  02/02/13 1303 Gypsy Decant, RN [phone] Chart reviewed for utilization of services. Pt experiencing increased weakness. PT to eval for possible SNF placement.

## 2013-02-02 NOTE — Progress Notes (Signed)
INITIAL NUTRITION ASSESSMENT  DOCUMENTATION CODES Per approved criteria  -Not applicable   INTERVENTION: - Ensure Complete TID - Encouraged increased PO intake - Will continue to monitor   NUTRITION DIAGNOSIS: Inadequate oral intake related to poor appetite as evidenced by pt report.   Goal: Pt to consume >90% of meals/supplements  Monitor:  Weights, labs, intake  Reason for Assessment: Nutrition risk   79 y.o. female  Admitting Dx: RUQ pain  ASSESSMENT: Pt admitted with sharp RUQ pain. Has hx of metastatic lung CA and hemorrhoids with onset of bleeding to rectal area on admit, found to have fecal impaction.   Met with pt who reports on/off poor intake PTA. Some days she will eat only a few bites of food, other days she will eat more. Reported 30 pound unintended weight loss in the past 14 months. Per weight trend, pt's weight relatively stable in the past 2 months. Pt interested in getting Ensure Complete, will order.   Pt with elevated BUN/Cr, low GFR, significantly elevated Alk phos, AST/ALT, and total bilirubin.   Height: Ht Readings from Last 1 Encounters:  02/01/13 5\' 1"  (1.549 m)    Weight: Wt Readings from Last 1 Encounters:  02/01/13 125 lb 7.1 oz (56.9 kg)    Ideal Body Weight: 105 lb  % Ideal Body Weight: 119%  Wt Readings from Last 10 Encounters:  02/01/13 125 lb 7.1 oz (56.9 kg)  01/30/13 124 lb (56.246 kg)  01/27/13 122 lb 6.4 oz (55.52 kg)  01/23/13 121 lb 4.8 oz (55.021 kg)  01/17/13 126 lb (57.153 kg)  12/26/12 127 lb 12.8 oz (57.97 kg)  12/20/12 127 lb 9.6 oz (57.879 kg)  12/15/12 129 lb 12.8 oz (58.877 kg)  12/06/12 127 lb 14.4 oz (58.015 kg)  12/02/12 128 lb 8 oz (58.287 kg)    Usual Body Weight: 121-129 lb  % Usual Body Weight: 97-103%  BMI:  Body mass index is 23.71 kg/(m^2).  Estimated Nutritional Needs: Kcal: 1610-9604 Protein: 60-70g Fluid: 1.4-1.6L/day  Skin: Non-pitting RLE, LLE edema  Diet Order: General  EDUCATION  NEEDS: -No education needs identified at this time   Intake/Output Summary (Last 24 hours) at 02/02/13 1124 Last data filed at 02/02/13 0545  Gross per 24 hour  Intake    650 ml  Output    600 ml  Net     50 ml    Last BM: 10/15  Labs:   Recent Labs Lab 02/01/13 0825 02/01/13 1526 02/02/13 0415  NA 138  --  135  K 4.5  --  4.2  CL  --   --  102  CO2 23  --  20  BUN 47.8*  --  58*  CREATININE 1.3* 1.29* 1.84*  CALCIUM 8.4  --  7.9*  GLUCOSE 98  --  114*    CBG (last 3)  No results found for this basename: GLUCAP,  in the last 72 hours  Scheduled Meds: . cefTRIAXone (ROCEPHIN)  IV  1 g Intravenous QHS  . docusate sodium  100 mg Oral BID  . hydrocortisone   Rectal TID  . hydrocortisone  25 mg Rectal BID  . nystatin  5 mL Oral QID  . senna  1 tablet Oral BID    Continuous Infusions: . sodium chloride 100 mL/hr at 02/02/13 0630    Past Medical History  Diagnosis Date  . Cervical spondylosis   . Renal cyst     simple bilateral 2008  . Diverticulosis   .  Vertigo, benign positional   . Hyperlipidemia   . Aortic sclerosis     by exam,  . Erosive osteoarthritis of hand   . History of calcium pyrophosphate deposition disease (CPPD)   . Gout     vs pseudo gout flares in past  . Mediastinal adenopathy   . Tobacco use   . COPD, mild   . Glaucoma   . Hypertension   . Osteopenia   . Hiatal hernia   . Hemorrhoids   . H/O urinary incontinence   . Childhood asthma   . Bronchitis   . TIA (transient ischemic attack)   . Claustrophobia   . Dizziness   . Bleeding from the nose     hx of severe nose bleeds-last time was 2 yrs ago, slight bleed last week  . lung ca 11/2011  . Allergy   . Lung cancer 11/26/2012    right lung  . History of radiation therapy 12/15/12- 12/29/12    right lung/mediastinum  . Metastasis to brain 01/11/13    lung primary    Past Surgical History  Procedure Laterality Date  . Cystoscopy, transurethral collagen injection therapy    .  Left total knee arthroplasty    . Appendectomy    . Hemorrhoid surgery    . Cataract surgery x 2    . Eye surgery    . Tonsillectomy    . Abdominal hysterectomy    . Colonoscopy      Levon Hedger MS, RD, LDN [phone] Pager [phone] After Hours Pager

## 2013-02-02 NOTE — Progress Notes (Signed)
OT Cancellation Note  Patient Details Name: [name] MRN: [id number] DOB: 05/16/1928   Cancelled Treatment:    Reason Eval/Treat Not Completed: Other (comment). Family was waiting to meet with MD.  Will check back another time.   Byron Tipping 02/02/2013, 11:18 AM Marica Otter, OTR/L [phone] 02/02/2013

## 2013-02-02 NOTE — Progress Notes (Signed)
[facility]  [facility] faxed in a referral to [facility] on 01/30/13 for PT and OT.  A visit has not been made yet due to hospital admission, but [facility] can see patient at d/c if pt does not go to SNF.    If patient discharges after hours, please call [phone].   Lanae Crumbly 02/02/2013, 2:31 PM

## 2013-02-02 NOTE — Progress Notes (Addendum)
Subjective: [name] is a very pleasant 79 year old female with a diagnosis of metastatic small cell lung cancer about a year ago treated with radiation and chemotherapy who presents with urinary retention and a UTI.  CT scan of the chest and abdomen revealed multiple new pulmonary nodules and multiple metastases in the liver and spleen.  Obvious progression of disease.  She is aware of these findings and will speak with Dr. Gwenyth Bouillon mom and later this morning.  She prefers to BE a no CODE BLUE.  She required a urinary catheter yesterday and 500 cc of urine were obtained with the catheter was placed.  She has a urinalysis consistent with UTI and Rocephin has been started. She is aware of the gravity of the situation in terms of her metastatic lung cancer and knows that not much more can be done for her situation realistically in terms of her lung cancer.  She does not want to be a burden to her family and mentioned that she may need help outside of her family for care outside of the hospital.  [facility] would likely be quite reasonable for her  Objective: Weight change:   Intake/Output Summary (Last 24 hours) at 02/02/13 0628 Last data filed at 02/02/13 0545  Gross per 24 hour  Intake    650 ml  Output    600 ml  Net     50 ml   Filed Vitals:   02/01/13 1630 02/01/13 2147 02/01/13 2157 02/02/13 0028  BP: 124/59 88/44 80/40  91/45  Pulse: 102 106  90  Temp: 98.5 F (36.9 C) 98.4 F (36.9 C)    TempSrc: Oral Oral    Resp: 16 20    Height:      Weight: 56.9 kg (125 lb 7.1 oz)     SpO2: 96% 93%     General:  Elderly female with alopecia and pallor, alert and oriented and very appreciative of care HEENT; oropharynx with moist mucous membranes, conjunctiva without pallor Neck: No JVD, supple Chest: Distant breath sounds throughout Heart: Regular rate and rhythm Abdomen: Soft nondistended Extremities: No significant edema or cyanosis Skin: Occasional ecchymosis Neurological:  Oriented x3, nonfocal, very weak in her legs.  Requires assistance with transfer to bedside commode  Lab Results: Results for orders placed during the hospital encounter of 02/01/13 (from the past 48 hour(s))  PROTIME-INR     Status: Abnormal   Collection Time    02/01/13 11:30 AM      Result Value Range   Prothrombin Time 15.7 (*) 11.6 - 15.2 seconds   INR 1.28  0.00 - 1.49  APTT     Status: None   Collection Time    02/01/13 11:30 AM      Result Value Range   aPTT 28  24 - 37 seconds  OCCULT BLOOD, POC DEVICE     Status: Abnormal   Collection Time    02/01/13 12:07 PM      Result Value Range   Fecal Occult Bld POSITIVE (*) NEGATIVE  CBC     Status: Abnormal   Collection Time    02/01/13  3:26 PM      Result Value Range   WBC 5.2  4.0 - 10.5 K/uL   Comment: ADJUSTED FOR NUCLEATED RBC'S   RBC 3.17 (*) 3.87 - 5.11 MIL/uL   Hemoglobin 9.8 (*) 12.0 - 15.0 g/dL   HCT 16.1 (*) 09.6 - 04.5 %   MCV 95.3  78.0 - 100.0 fL  MCH 30.9  26.0 - 34.0 pg   MCHC 32.5  30.0 - 36.0 g/dL   RDW 40.9 (*) 81.1 - 91.4 %   Platelets 81 (*) 150 - 400 K/uL   Comment: SPECIMEN CHECKED FOR CLOTS     REPEATED TO VERIFY     PLATELET COUNT CONFIRMED BY SMEAR  CREATININE, SERUM     Status: Abnormal   Collection Time    02/01/13  3:26 PM      Result Value Range   Creatinine, Ser 1.29 (*) 0.50 - 1.10 mg/dL   GFR calc non Af Amer 37 (*) >90 mL/min   GFR calc Af Amer 43 (*) >90 mL/min   Comment: (NOTE)     The eGFR has been calculated using the CKD EPI equation.     This calculation has not been validated in all clinical situations.     eGFR's persistently <90 mL/min signify possible Chronic Kidney     Disease.  URINALYSIS, ROUTINE W REFLEX MICROSCOPIC     Status: Abnormal   Collection Time    02/01/13 10:25 PM      Result Value Range   Color, Urine AMBER (*) YELLOW   Comment: BIOCHEMICALS MAY BE AFFECTED BY COLOR   APPearance CLOUDY (*) CLEAR   Specific Gravity, Urine 1.024  1.005 - 1.030   pH  5.0  5.0 - 8.0   Glucose, UA NEGATIVE  NEGATIVE mg/dL   Hgb urine dipstick NEGATIVE  NEGATIVE   Bilirubin Urine SMALL (*) NEGATIVE   Ketones, ur NEGATIVE  NEGATIVE mg/dL   Protein, ur NEGATIVE  NEGATIVE mg/dL   Urobilinogen, UA 1.0  0.0 - 1.0 mg/dL   Nitrite POSITIVE (*) NEGATIVE   Leukocytes, UA MODERATE (*) NEGATIVE  URINE MICROSCOPIC-ADD ON     Status: Abnormal   Collection Time    02/01/13 10:25 PM      Result Value Range   WBC, UA 3-6  <3 WBC/hpf   Bacteria, UA MANY (*) RARE   Urine-Other AMORPHOUS URATES/PHOSPHATES    COMPREHENSIVE METABOLIC PANEL     Status: Abnormal   Collection Time    02/02/13  4:15 AM      Result Value Range   Sodium 135  135 - 145 mEq/L   Potassium 4.2  3.5 - 5.1 mEq/L   Chloride 102  96 - 112 mEq/L   CO2 20  19 - 32 mEq/L   Glucose, Bld 114 (*) 70 - 99 mg/dL   BUN 58 (*) 6 - 23 mg/dL   Creatinine, Ser 7.82 (*) 0.50 - 1.10 mg/dL   Calcium 7.9 (*) 8.4 - 10.5 mg/dL   Total Protein 4.8 (*) 6.0 - 8.3 g/dL   Albumin 1.9 (*) 3.5 - 5.2 g/dL   AST 956 (*) 0 - 37 U/L   ALT 206 (*) 0 - 35 U/L   Alkaline Phosphatase 1102 (*) 39 - 117 U/L   Total Bilirubin 2.8 (*) 0.3 - 1.2 mg/dL   GFR calc non Af Amer 24 (*) >90 mL/min   GFR calc Af Amer 28 (*) >90 mL/min   Comment: (NOTE)     The eGFR has been calculated using the CKD EPI equation.     This calculation has not been validated in all clinical situations.     eGFR's persistently <90 mL/min signify possible Chronic Kidney     Disease.  CBC     Status: Abnormal   Collection Time    02/02/13  4:15 AM  Result Value Range   WBC 7.2  4.0 - 10.5 K/uL   RBC 2.73 (*) 3.87 - 5.11 MIL/uL   Hemoglobin 8.5 (*) 12.0 - 15.0 g/dL   HCT 16.1 (*) 09.6 - 04.5 %   MCV 96.0  78.0 - 100.0 fL   MCH 31.1  26.0 - 34.0 pg   MCHC 32.4  30.0 - 36.0 g/dL   RDW 40.9 (*) 81.1 - 91.4 %   Platelets 57 (*) 150 - 400 K/uL   Comment: SPECIMEN CHECKED FOR CLOTS     REPEATED TO VERIFY     DELTA CHECK NOTED     PLATELET COUNT  CONFIRMED BY SMEAR    Studies/Results: Ct Abdomen Pelvis Wo Contrast  02/01/2013   CLINICAL DATA:  Lung cancer with brain metastasis.  EXAM: CT CHEST, ABDOMEN AND PELVIS WITHOUT CONTRAST  TECHNIQUE: Multidetector CT imaging of the chest, abdomen and pelvis was performed following the standard protocol without IV contrast.  COMPARISON:  Chest CT 11/25/2012 and CT abdomen/pelvis 06/27/2012.  FINDINGS: CT CHEST FINDINGS  No breast mass or axillary lymphadenopathy. Stable supraclavicular adenopathy. No new nodal lesions. The bony thorax is intact. No destructive bone lesions or spinal canal compromise.  The heart is normal in size. No pericardial effusion. Persistent bulky mediastinal and hilar lymphadenopathy. The the right peritracheal nodal lesion measures 2.4 x 1.8 cm and previously measured 3.3 x 2.6 cm. The aortic 0 pulmonary window nodal mass measures 3.6 x 2.6 cm and previously measured 3.8 x 2.5 cm. The subcarinal adenopathy has also decreased slightly in size.  Examination of the lung parenchyma demonstrates numerous new metastatic pulmonary nodules. The right lung mass is slightly smaller. The 8 previously measured a maximum of 8 cm and now measures 6.3 cm. No pleural effusion. Stable atherosclerotic calcifications involving the aorta and coronary arteries.  CT ABDOMEN AND PELVIS FINDINGS  New diffuse hepatic metastatic disease throughout the liver. No biliary dilatation. The gallbladder is normal. No common bile duct dilatation. The pancreas is grossly normal. The splenic lesion has enlarged and new splenic lesions are noted. The adrenal glands and kidneys are unremarkable and stable. Small right adrenal gland adenoma and right renal cysts are noted.  The stomach, duodenum, small bowel and colon are grossly normal. No inflammatory changes, mass lesions or obstructive findings. Stable advanced atherosclerotic calcifications involving the aorta. Small scattered mesenteric and retroperitoneal lymph nodes  but no mass or adenopathy.  Moderate air noted in the bladder, may be from recent instrumentation. No pelvic mass or adenopathy. Bony structures are intact. No definite destructive bone lesions  IMPRESSION: CT CHEST IMPRESSION  Slight interval decrease in size of the large right lung mass and slight decrease in size of the mediastinal and hilar lymph nodes. However, there are numerous new pulmonary metastasis.  Stable supraclavicular adenopathy.  CT ABDOMEN AND PELVIS IMPRESSION  New diffuse hepatic metastatic disease, enlarging left splenic lesion and new splenic lesions.   Electronically Signed   By: Loralie Champagne M.D.   On: 02/01/2013 11:17   Ct Chest Wo Contrast  02/01/2013   CLINICAL DATA:  Lung cancer with brain metastasis.  EXAM: CT CHEST, ABDOMEN AND PELVIS WITHOUT CONTRAST  TECHNIQUE: Multidetector CT imaging of the chest, abdomen and pelvis was performed following the standard protocol without IV contrast.  COMPARISON:  Chest CT 11/25/2012 and CT abdomen/pelvis 06/27/2012.  FINDINGS: CT CHEST FINDINGS  No breast mass or axillary lymphadenopathy. Stable supraclavicular adenopathy. No new nodal lesions. The bony thorax is intact. No  destructive bone lesions or spinal canal compromise.  The heart is normal in size. No pericardial effusion. Persistent bulky mediastinal and hilar lymphadenopathy. The the right peritracheal nodal lesion measures 2.4 x 1.8 cm and previously measured 3.3 x 2.6 cm. The aortic 0 pulmonary window nodal mass measures 3.6 x 2.6 cm and previously measured 3.8 x 2.5 cm. The subcarinal adenopathy has also decreased slightly in size.  Examination of the lung parenchyma demonstrates numerous new metastatic pulmonary nodules. The right lung mass is slightly smaller. The 8 previously measured a maximum of 8 cm and now measures 6.3 cm. No pleural effusion. Stable atherosclerotic calcifications involving the aorta and coronary arteries.  CT ABDOMEN AND PELVIS FINDINGS  New diffuse hepatic  metastatic disease throughout the liver. No biliary dilatation. The gallbladder is normal. No common bile duct dilatation. The pancreas is grossly normal. The splenic lesion has enlarged and new splenic lesions are noted. The adrenal glands and kidneys are unremarkable and stable. Small right adrenal gland adenoma and right renal cysts are noted.  The stomach, duodenum, small bowel and colon are grossly normal. No inflammatory changes, mass lesions or obstructive findings. Stable advanced atherosclerotic calcifications involving the aorta. Small scattered mesenteric and retroperitoneal lymph nodes but no mass or adenopathy.  Moderate air noted in the bladder, may be from recent instrumentation. No pelvic mass or adenopathy. Bony structures are intact. No definite destructive bone lesions  IMPRESSION: CT CHEST IMPRESSION  Slight interval decrease in size of the large right lung mass and slight decrease in size of the mediastinal and hilar lymph nodes. However, there are numerous new pulmonary metastasis.  Stable supraclavicular adenopathy.  CT ABDOMEN AND PELVIS IMPRESSION  New diffuse hepatic metastatic disease, enlarging left splenic lesion and new splenic lesions.   Electronically Signed   By: Loralie Champagne M.D.   On: 02/01/2013 11:17   Medications: Scheduled Meds: . cefTRIAXone (ROCEPHIN)  IV  1 g Intravenous QHS  . docusate sodium  100 mg Oral BID  . heparin  5,000 Units Subcutaneous Q8H  . hydrocortisone   Rectal TID  . nystatin  5 mL Oral QID  . senna  1 tablet Oral BID  . sodium chloride  250 mL Intravenous Once   Continuous Infusions: . sodium chloride     PRN Meds:.LORazepam, morphine injection, ondansetron (ZOFRAN) IV, ondansetron, polyethylene glycol, sorbitol  Assessment/Plan:  Active Problems:  Lung cancer - metastatic small cell Hemorrhoids  Constipation UTI with urinary retention   1. Constipation  1. Cont with bowel regimen 2. Encourage PO fluids 2. Elevated LFT's   1. Markedly elevated from LFT's from 8/14 - almost certainly secondary to metastatic disease 2. Abd CT with evidence of new lesions - aware 3. Hemorrhoids  1. Stool softeners as tolerated we'll also use Anusol HC Suppositories as well 4. Metastatic lung cancer  1. New metastatic lesions - multiple metastatic lesions throughout her lungs brain and liver 2. Dr. Arbutus Ped to see today - would expect comfort measures from this point on 3. For now, will consult PT/OT given weakness - question is whether she needs one or 2 person assist at this point 5. DVT prophylaxis  1. Discontinue subcutaneous heparin with low platelet count       6.   UTI with urinary retention - I and O. cath for greater than 250 cc on bladder scan - nursing protocol to place Foley catheter if necessary.  Continue Rocephin and followup on cultures  Code Status:  Patient is requesting NO CODE BLUE status and order has been written  Family Communication: Pt in room (indicate person spoken with, if applicable, with phone number if by telephone)  Disposition Plan: Pending (indicate anticipated LOS)     LOS: 1 day   [name], MD 02/02/2013, 6:28 AM

## 2013-02-02 NOTE — Progress Notes (Signed)
PT Cancellation Note  Patient Details Name: [name] MRN: [id number] DOB: 14-Feb-1929   Cancelled Treatment:     PT completed chart review and went to evaluate the pt.  Pt and family were waiting for a conference with the MD at that time and requested to hold off on PT at that time.  Will check back as able to complete evaluation.   [name] LUBECK 02/02/2013, 11:15 AM

## 2013-02-02 NOTE — Consult Note (Signed)
[facility] Liaison: Received request from CSW to see daughter for [facility] education. Met with patient and daughter at daughter's request. Patient confirmed speaking with Dr. Kevan Ny re [facility]. Answered her specific questions. Patient dozed off. Continued to meet with daughter who hopes to be present when Dr. Arbutus Ped visits either this afternoon or in AM before making plan. Patient and daughter gave permission for medical record to be reviewed. Daughter has my contact information and requested I follow up tomorrow which I will do. Thank you. Forrestine Him LCSW [phone]

## 2013-02-02 NOTE — Progress Notes (Signed)
Clinical Social Work Department BRIEF PSYCHOSOCIAL ASSESSMENT 02/02/2013  Patient:  [name], [name]     Account Number:  [id number]     Admit date:  02/01/2013  Clinical Social Worker:  Jacelyn Grip  Date/Time:  02/02/2013 03:00 PM  Referred by:  Physician  Date Referred:  02/02/2013 Referred for  Residential hospice placement   Other Referral:   Interview type:  Family Other interview type:    PSYCHOSOCIAL DATA Living Status:  ALONE Admitted from facility:   Level of care:   Primary support name:  [name] Rush/daughter Primary support relationship to patient:  CHILD, ADULT Degree of support available:   strong    CURRENT CONCERNS Current Concerns  Post-Acute Placement   Other Concerns:    SOCIAL WORK ASSESSMENT / PLAN CSW received notification from RN that pt family requesting [facility].    CSW met with pt daughters in hallway initially. Pt family discussed that they were interested in Palliative Care and they were unsure if that was the same as [facility]. CSW discussed that [facility] is a residential hospice and philosophy surround comfort care for the patient. Pt family was interested in pt being transferred to palliative care unit and CSW discussed that [facility] no longer has palliative care unit. Pt family had a number of questions re: Psychologist, sport and exercise and CSW asked pt family if they would be agreeable to speaking with [facility] liaison, Forrestine Him re: education surrounding [facility]. Pt family agreeable.    [facility] liaison, Forrestine Him was able to meet with pt and pt daughter at bedside and clarify questions. Pt was agreeable to official referral to [facility].    CSW to continue to follow to assist wit residential hospice placement.   Assessment/plan status:  Psychosocial Support/Ongoing Assessment of Needs Other assessment/ plan:   discharge planning   Information/referral to community resources:   Residential Hospice list     PATIENT'S/FAMILY'S RESPONSE TO PLAN OF CARE: CSW only met with pt family briefly as Psychologist, sport and exercise education was very necessary from [employer], Forrestine Him which was able to clarify that pt wishes are for [facility].    Jacklynn Lewis, MSW, LCSWA  Clinical Social Work [phone]

## 2013-02-02 NOTE — Progress Notes (Addendum)
Patient had large bowel movement. Bright red blood in commode and on stool. NP notified, orders for CBC at 11am placed and to hold morning heparin dose.  Patient unable to void spontaneously since cathed at 2200. Catheterized-100cc output. Notified NP.  250cc bolus ordered and fluids increased to 100cc/hr.   Per Dr. Kevan Ny, will follow patient's voiding and following foley cath removal protocol, will place foley catheter if unable to void spontaneously.

## 2013-02-03 ENCOUNTER — Telehealth: Payer: Self-pay | Admitting: *Deleted

## 2013-02-03 ENCOUNTER — Telehealth: Payer: Self-pay | Admitting: Medical Oncology

## 2013-02-03 LAB — URINE CULTURE

## 2013-02-03 MED ORDER — FENTANYL 25 MCG/HR TD PT72: 1.0000 | MEDICATED_PATCH | TRANSDERMAL | Status: AC

## 2013-02-03 MED ORDER — DEXAMETHASONE 2 MG PO TABS: 2.0000 mg | ORAL_TABLET | Freq: Two times a day (BID) | ORAL | Status: AC

## 2013-02-03 MED ORDER — SENNA 8.6 MG PO TABS: 1.0000 | ORAL_TABLET | Freq: Two times a day (BID) | ORAL | Status: AC

## 2013-02-03 MED ORDER — DSS 100 MG PO CAPS: 100.0000 mg | ORAL_CAPSULE | Freq: Two times a day (BID) | ORAL | Status: AC

## 2013-02-03 MED ORDER — ONDANSETRON HCL 4 MG PO TABS: 4.0000 mg | ORAL_TABLET | Freq: Three times a day (TID) | ORAL | Status: DC | PRN

## 2013-02-03 MED ORDER — CEFUROXIME AXETIL 250 MG PO TABS: 250.0000 mg | ORAL_TABLET | Freq: Two times a day (BID) | ORAL | Status: AC

## 2013-02-03 MED ORDER — POLYETHYLENE GLYCOL 3350 17 G PO PACK: 17.0000 g | PACK | Freq: Every day | ORAL | Status: AC | PRN

## 2013-02-03 MED ORDER — HYDROCORTISONE 2.5 % RE CREA: TOPICAL_CREAM | Freq: Three times a day (TID) | RECTAL | Status: AC

## 2013-02-03 MED ORDER — MORPHINE SULFATE (CONCENTRATE) 10 MG /0.5 ML PO SOLN: 5.0000 mg | ORAL | Status: AC | PRN

## 2013-02-03 MED ORDER — MORPHINE SULFATE (CONCENTRATE) 10 MG /0.5 ML PO SOLN: 5.0000 mg | ORAL | Status: DC | PRN

## 2013-02-03 NOTE — Progress Notes (Signed)
OT Cancellation Note  Patient Details Name: [name] MRN: [id number] DOB: [birth year]   Cancelled Treatment:    Reason Eval/Treat Not Completed: Pt to [facility] today  Alba Cory 02/03/2013, 9:17 AM

## 2013-02-03 NOTE — Progress Notes (Signed)
Patient is set to discharge to [facility] today. Patient's family at bedside & aware. Discharge packet in [place]. [facility] called for transport.   Unice Bailey, LCSW [facility] Clinical Social Worker cell #: [phone]

## 2013-02-03 NOTE — Telephone Encounter (Signed)
Called pt's home # to check on her status, no answer. Called daughter, Daivd Council who stated pt was admitted to hospital through ED on Wed due to declining status. Pt had CT scan Wed, and daughter discussed pt's decline w/PA who suggested pt be taken to ED. Pt was admitted but is being discharged today to [facility]. [name] requested Dr Dayton Scrape be notified. She expressed appreciation for all Dr Dayton Scrape and this RN had done in caring for her mother. Randel Books verbal thanks, support, concern and care.

## 2013-02-03 NOTE — Telephone Encounter (Signed)
There is a bed available today at [facility] for [place]. Will Dr Arbutus Ped be attending with Dr Delanna Notice doing symptom management?  I told Social worker yes.

## 2013-02-03 NOTE — Progress Notes (Signed)
DIAGNOSIS AND STAGE: Metastatic non-small cell lung cancer, adenocarcinoma with negative EGFR mutation and negative ALK gene translocation diagnosed in August of 2013   PRIOR THERAPY:  1) Systemic chemotherapy with carboplatin for AUC of 5 and Alimta 500 mg/M2 every 3 weeks. The patient is status post 6 cycles.  2) Maintenance chemotherapy with Alimta at 500 mg/M2 every 3 weeks. Status post 3 cycles discontinued today secondary to disease progression.  3) Tarceva 150 mg by mouth daily, status post 2 months of therapy. Therapy beginning 3/22/20114.  4) Docetaxel 75 mg/M2 every 3 weeks with Neulasta support, first dose was given on 09/26/2012. S/p 3 cycles, discontinued today secondary to disease progression.  5) status post palliative radiotherapy to the enlarging right upper lobe lung mass under the care of Dr. Dayton Scrape. 6) status post whole brain irradiation for multiple metastatic brain lesions under the care of Dr. Dayton Scrape.  CURRENT THERAPY: None.    Subjective: The patient is seen and examined today. I had a lengthy discussion with the patient and her daughter about her current disease status. She was admitted yesterday with significant fatigue and weakness and failure to thrive. She completed a course of whole brain irradiation recently. The patient continues to have mild pain at the right upper quadrant of the abdomen and shortness of breath with exertion. She had no fever or chills. No nausea or vomiting.  Objective: Vital signs in last 24 hours: Temp:  [97.5 F (36.4 C)-98 F (36.7 C)] 97.8 F (36.6 C) (10/17 0520) Pulse Rate:  [100-121] 100 (10/17 0520) Resp:  [16-20] 20 (10/17 0520) BP: (94-106)/(47-56) 105/56 mmHg (10/17 0520) SpO2:  [96 %] 96 % (10/17 0520)  Intake/Output from previous day: 10/16 0701 - 10/17 0700 In: -  Out: 400 [Urine:400] Intake/Output this shift:    General appearance: alert, cooperative, fatigued and no distress Resp: diminished breath sounds RUL and  dullness to percussion RUL Cardio: regular rate and rhythm, S1, S2 normal, no murmur, click, rub or gallop GI: soft, non-tender; bowel sounds normal; no masses,  no organomegaly Extremities: extremities normal, atraumatic, no cyanosis or edema  Lab Results:   Recent Labs  02/02/13 0415 02/02/13 1025  WBC 7.2 3.1*  HGB 8.5* 8.5*  HCT 26.2* 25.6*  PLT 57* 47*   BMET  Recent Labs  02/01/13 0825 02/01/13 1526 02/02/13 0415  NA 138  --  135  K 4.5  --  4.2  CL  --   --  102  CO2 23  --  20  GLUCOSE 98  --  114*  BUN 47.8*  --  58*  CREATININE 1.3* 1.29* 1.84*  CALCIUM 8.4  --  7.9*    Studies/Results: Ct Abdomen Pelvis Wo Contrast  02/01/2013   CLINICAL DATA:  Lung cancer with brain metastasis.  EXAM: CT CHEST, ABDOMEN AND PELVIS WITHOUT CONTRAST  TECHNIQUE: Multidetector CT imaging of the chest, abdomen and pelvis was performed following the standard protocol without IV contrast.  COMPARISON:  Chest CT 11/25/2012 and CT abdomen/pelvis 06/27/2012.  FINDINGS: CT CHEST FINDINGS  No breast mass or axillary lymphadenopathy. Stable supraclavicular adenopathy. No new nodal lesions. The bony thorax is intact. No destructive bone lesions or spinal canal compromise.  The heart is normal in size. No pericardial effusion. Persistent bulky mediastinal and hilar lymphadenopathy. The the right peritracheal nodal lesion measures 2.4 x 1.8 cm and previously measured 3.3 x 2.6 cm. The aortic 0 pulmonary window nodal mass measures 3.6 x 2.6 cm and previously measured 3.8  x 2.5 cm. The subcarinal adenopathy has also decreased slightly in size.  Examination of the lung parenchyma demonstrates numerous new metastatic pulmonary nodules. The right lung mass is slightly smaller. The 8 previously measured a maximum of 8 cm and now measures 6.3 cm. No pleural effusion. Stable atherosclerotic calcifications involving the aorta and coronary arteries.  CT ABDOMEN AND PELVIS FINDINGS  New diffuse hepatic metastatic  disease throughout the liver. No biliary dilatation. The gallbladder is normal. No common bile duct dilatation. The pancreas is grossly normal. The splenic lesion has enlarged and new splenic lesions are noted. The adrenal glands and kidneys are unremarkable and stable. Small right adrenal gland adenoma and right renal cysts are noted.  The stomach, duodenum, small bowel and colon are grossly normal. No inflammatory changes, mass lesions or obstructive findings. Stable advanced atherosclerotic calcifications involving the aorta. Small scattered mesenteric and retroperitoneal lymph nodes but no mass or adenopathy.  Moderate air noted in the bladder, may be from recent instrumentation. No pelvic mass or adenopathy. Bony structures are intact. No definite destructive bone lesions  IMPRESSION: CT CHEST IMPRESSION  Slight interval decrease in size of the large right lung mass and slight decrease in size of the mediastinal and hilar lymph nodes. However, there are numerous new pulmonary metastasis.  Stable supraclavicular adenopathy.  CT ABDOMEN AND PELVIS IMPRESSION  New diffuse hepatic metastatic disease, enlarging left splenic lesion and new splenic lesions.   Electronically Signed   By: Loralie Champagne M.D.   On: 02/01/2013 11:17   Ct Chest Wo Contrast  02/01/2013   CLINICAL DATA:  Lung cancer with brain metastasis.  EXAM: CT CHEST, ABDOMEN AND PELVIS WITHOUT CONTRAST  TECHNIQUE: Multidetector CT imaging of the chest, abdomen and pelvis was performed following the standard protocol without IV contrast.  COMPARISON:  Chest CT 11/25/2012 and CT abdomen/pelvis 06/27/2012.  FINDINGS: CT CHEST FINDINGS  No breast mass or axillary lymphadenopathy. Stable supraclavicular adenopathy. No new nodal lesions. The bony thorax is intact. No destructive bone lesions or spinal canal compromise.  The heart is normal in size. No pericardial effusion. Persistent bulky mediastinal and hilar lymphadenopathy. The the right peritracheal  nodal lesion measures 2.4 x 1.8 cm and previously measured 3.3 x 2.6 cm. The aortic 0 pulmonary window nodal mass measures 3.6 x 2.6 cm and previously measured 3.8 x 2.5 cm. The subcarinal adenopathy has also decreased slightly in size.  Examination of the lung parenchyma demonstrates numerous new metastatic pulmonary nodules. The right lung mass is slightly smaller. The 8 previously measured a maximum of 8 cm and now measures 6.3 cm. No pleural effusion. Stable atherosclerotic calcifications involving the aorta and coronary arteries.  CT ABDOMEN AND PELVIS FINDINGS  New diffuse hepatic metastatic disease throughout the liver. No biliary dilatation. The gallbladder is normal. No common bile duct dilatation. The pancreas is grossly normal. The splenic lesion has enlarged and new splenic lesions are noted. The adrenal glands and kidneys are unremarkable and stable. Small right adrenal gland adenoma and right renal cysts are noted.  The stomach, duodenum, small bowel and colon are grossly normal. No inflammatory changes, mass lesions or obstructive findings. Stable advanced atherosclerotic calcifications involving the aorta. Small scattered mesenteric and retroperitoneal lymph nodes but no mass or adenopathy.  Moderate air noted in the bladder, may be from recent instrumentation. No pelvic mass or adenopathy. Bony structures are intact. No definite destructive bone lesions  IMPRESSION: CT CHEST IMPRESSION  Slight interval decrease in size of the large right  lung mass and slight decrease in size of the mediastinal and hilar lymph nodes. However, there are numerous new pulmonary metastasis.  Stable supraclavicular adenopathy.  CT ABDOMEN AND PELVIS IMPRESSION  New diffuse hepatic metastatic disease, enlarging left splenic lesion and new splenic lesions.   Electronically Signed   By: Loralie Champagne M.D.   On: 02/01/2013 11:17    Assessment/Plan: This is a very pleasant 79 years old white female with metastatic  non-small cell lung cancer, adenocarcinoma status post several chemotherapy regimens as well as palliative radiation including whole brain irradiation recently. Unfortunately her recent scan of the chest, abdomen and pelvis showed significant disease progression especially in the liver. I have a lengthy discussion with the patient and her daughter about her current condition and treatment options. I strongly recommended for the patient and her daughter to consider palliative care and hospice at this point. I do think the patient would be a good candidate for any further systemic therapy. Ms. Delconte and her daughter [name] are in agreement with the current plan. The patient is interested in comfort care only at this point. They are also interested in [facility] after discharge from the hospital. The patient and her daughter were very appreciative for the care she received since her diagnosis with lung cancer. I would be happy to be the attending of record for her hospice needs. Thank you for taking good care of Ms. Packett, I will continue to follow up the patient with you and assist in her management.   LOS: 2 days    Carolena Fairbank K. 02/03/2013

## 2013-02-03 NOTE — Discharge Summary (Addendum)
Physician Discharge Summary  NAME:[name]  ZHY:[id number]  DOB: 1928/08/21   Admit date: 02/01/2013 Discharge date: 02/03/2013  Discharge Diagnoses:   Lung cancer - metastatic small cell - comfort measures  Hemorrhoids - continue Anusol therapy as needed  Constipation - continue medications for constipation as needed UTI with urinary retention - continue Foley for now and treat with Ceftin 250 milligrams twice daily for 7 days  Code Status: Patient is requesting NO CODE BLUE status and order has been written  Disposition Plan:  Transfer to [facility] on 02/04/2013  Discharge Physical Exam:  General Appearance: Alert, cooperative, no distress, appears stated age  Weight change:   Intake/Output Summary (Last 24 hours) at 02/03/13 0801 Last data filed at 02/02/13 1755  Gross per 24 hour  Intake      0 ml  Output    400 ml  Net   -400 ml   Filed Vitals:   02/02/13 0505 02/02/13 1400 02/02/13 2253 02/03/13 0520  BP: 103/47 106/51 94/47 105/56  Pulse: 92 121 105 100  Temp: 97.8 F (36.6 C) 98 F (36.7 C) 97.5 F (36.4 C) 97.8 F (36.6 C)  TempSrc: Oral Oral Oral Oral  Resp: 16 20 16 20   Height:      Weight:      SpO2: 97% 96% 96% 96%    General: Elderly female with alopecia and pallor, alert and oriented and very appreciative of care  HEENT; oropharynx with moist mucous membranes, conjunctiva without pallor  Neck: No JVD, supple  Chest: Distant breath sounds throughout  Heart: Regular rate and rhythm  Abdomen: Soft nondistended GU:  Foley catheter in place  Extremities: No significant edema or cyanosis  Skin: Occasional ecchymosis  Neurological: Oriented x3, nonfocal, very weak in her legs. Requires assistance with transfer to bedside commode   Discharge Condition: [facility] Course: [name] is a very pleasant 79 year old female with a diagnosis of metastatic small cell lung cancer about a year ago treated with radiation and chemotherapy  who presents with urinary retention and a UTI. CT scan of the chest and abdomen revealed multiple new pulmonary nodules and multiple metastases in the liver and spleen. Obvious progression of disease. She is aware of these findings and will speak with Dr. Gwenyth Bouillon mom and later this morning. She prefers to BE a no CODE BLUE. She has required a urinary catheter y for urinary retention possibly secondary to UTI.Marland Kitchen She has a urinalysis consistent with UTI and Rocephin was been started and we will switch to Ceftin 250 milligrams twice daily for 7 days.  She is aware of the gravity of the situation in terms of her metastatic lung cancer and knows that not much more can be done for her situation realistically in terms of her lung cancer. She does not want to be a burden to her family and will be transferred to [facility] today for comfort care.  She is a delightful lady and very understanding of her situation and desires comfort care only   Things to follow up in the outpatient setting: Pain control and decision can be made about Foley catheter continuation at [facility]  Consults: Treatment Team:  Si Gaul, MD  Disposition: Discharge to [facility] on 02/03/2013  Discharge Orders   Future Appointments Provider Department Dept Phone   03/07/2013 11:00 AM Maryln Gottron, MD [facility] [phone]   Future Orders Complete By Expires   Call MD for:  difficulty breathing, headache or visual disturbances  As directed    Call MD for:  severe uncontrolled pain  As directed    Diet - low sodium heart healthy  As directed    Increase activity slowly  As directed        Medication List    STOP taking these medications       ADVAIR DISKUS 250-50 MCG/DOSE Aepb  Generic drug:  Fluticasone-Salmeterol     aspirin 81 MG tablet     calcium carbonate 500 MG chewable tablet  Commonly known as:  TUMS - dosed in mg elemental calcium     diltiazem 2 % Gel      estrogens (conjugated) 0.625 MG tablet  Commonly known as:  PREMARIN     magic mouthwash Soln      TAKE these medications       acetaminophen 325 MG tablet  Commonly known as:  TYLENOL  Take 650 mg by mouth every 6 (six) hours as needed for pain.     dexamethasone 2 MG tablet  Commonly known as:  DECADRON  Take 1 tablet (2 mg total) by mouth 2 (two) times daily with a meal.     DSS 100 MG Caps  Take 100 mg by mouth 2 (two) times daily.     fentaNYL 25 MCG/HR patch  Commonly known as:  DURAGESIC - dosed mcg/hr  Place 1 patch (25 mcg total) onto the skin every 3 (three) days.     hydrocortisone 2.5 % rectal cream  Commonly known as:  ANUSOL-HC  Place rectally 3 (three) times daily.     ipratropium 0.06 % nasal spray  Commonly known as:  ATROVENT  Place 2 sprays into the nose 4 (four) times daily as needed for rhinitis.     lansoprazole 30 MG capsule  Commonly known as:  PREVACID  Take 1 capsule (30 mg total) by mouth daily.     LORazepam 0.5 MG tablet  Commonly known as:  ATIVAN  Take 0.5 mg by mouth every 8 (eight) hours as needed (for nausea).     morphine CONCENTRATE 10 mg / 0.5 ml concentrated solution  Take 0.25 mLs (5 mg total) by mouth every 2 (two) hours as needed.     ondansetron 8 MG disintegrating tablet  Commonly known as:  ZOFRAN-ODT  Take 8 mg by mouth every 8 (eight) hours as needed for nausea.     polyethylene glycol packet  Commonly known as:  MIRALAX / GLYCOLAX  Take 17 g by mouth daily as needed.     senna 8.6 MG Tabs tablet  Commonly known as:  SENOKOT  Take 1 tablet (8.6 mg total) by mouth 2 (two) times daily.     timolol 0.5 % ophthalmic gel-forming  Commonly known as:  TIMOPTIC-XR  Place 1 drop into the left eye 2 (two) times daily. Ceftin 250 milligrams 1 orally twice daily for 7 days            Follow-up Information   Follow up with [facility] K[facility] MD. Schedule an appointment as soon as possible for a visit in 1 day. (At your  previously scheduled appointment)    Specialty:  Oncology   Contact information:   [address] [place] [address] [phone]       The results of significant diagnostics from this hospitalization (including imaging, microbiology, ancillary and laboratory) are listed below for reference.    Significant Diagnostic Studies: Ct Abdomen Pelvis Wo Contrast  02/01/2013   CLINICAL DATA:  Lung cancer with brain metastasis.  EXAM: CT CHEST, ABDOMEN AND PELVIS WITHOUT CONTRAST  TECHNIQUE: Multidetector CT imaging of the chest, abdomen and pelvis was performed following the standard protocol without IV contrast.  COMPARISON:  Chest CT 11/25/2012 and CT abdomen/pelvis 06/27/2012.  FINDINGS: CT CHEST FINDINGS  No breast mass or axillary lymphadenopathy. Stable supraclavicular adenopathy. No new nodal lesions. The bony thorax is intact. No destructive bone lesions or spinal canal compromise.  The heart is normal in size. No pericardial effusion. Persistent bulky mediastinal and hilar lymphadenopathy. The the right peritracheal nodal lesion measures 2.4 x 1.8 cm and previously measured 3.3 x 2.6 cm. The aortic 0 pulmonary window nodal mass measures 3.6 x 2.6 cm and previously measured 3.8 x 2.5 cm. The subcarinal adenopathy has also decreased slightly in size.  Examination of the lung parenchyma demonstrates numerous new metastatic pulmonary nodules. The right lung mass is slightly smaller. The 8 previously measured a maximum of 8 cm and now measures 6.3 cm. No pleural effusion. Stable atherosclerotic calcifications involving the aorta and coronary arteries.  CT ABDOMEN AND PELVIS FINDINGS  New diffuse hepatic metastatic disease throughout the liver. No biliary dilatation. The gallbladder is normal. No common bile duct dilatation. The pancreas is grossly normal. The splenic lesion has enlarged and new splenic lesions are noted. The adrenal glands and kidneys are unremarkable and stable. Small right adrenal  gland adenoma and right renal cysts are noted.  The stomach, duodenum, small bowel and colon are grossly normal. No inflammatory changes, mass lesions or obstructive findings. Stable advanced atherosclerotic calcifications involving the aorta. Small scattered mesenteric and retroperitoneal lymph nodes but no mass or adenopathy.  Moderate air noted in the bladder, may be from recent instrumentation. No pelvic mass or adenopathy. Bony structures are intact. No definite destructive bone lesions  IMPRESSION: CT CHEST IMPRESSION  Slight interval decrease in size of the large right lung mass and slight decrease in size of the mediastinal and hilar lymph nodes. However, there are numerous new pulmonary metastasis.  Stable supraclavicular adenopathy.  CT ABDOMEN AND PELVIS IMPRESSION  New diffuse hepatic metastatic disease, enlarging left splenic lesion and new splenic lesions.   Electronically Signed   By: Loralie Champagne M.D.   On: 02/01/2013 11:17   Ct Chest Wo Contrast  02/01/2013   CLINICAL DATA:  Lung cancer with brain metastasis.  EXAM: CT CHEST, ABDOMEN AND PELVIS WITHOUT CONTRAST  TECHNIQUE: Multidetector CT imaging of the chest, abdomen and pelvis was performed following the standard protocol without IV contrast.  COMPARISON:  Chest CT 11/25/2012 and CT abdomen/pelvis 06/27/2012.  FINDINGS: CT CHEST FINDINGS  No breast mass or axillary lymphadenopathy. Stable supraclavicular adenopathy. No new nodal lesions. The bony thorax is intact. No destructive bone lesions or spinal canal compromise.  The heart is normal in size. No pericardial effusion. Persistent bulky mediastinal and hilar lymphadenopathy. The the right peritracheal nodal lesion measures 2.4 x 1.8 cm and previously measured 3.3 x 2.6 cm. The aortic 0 pulmonary window nodal mass measures 3.6 x 2.6 cm and previously measured 3.8 x 2.5 cm. The subcarinal adenopathy has also decreased slightly in size.  Examination of the lung parenchyma demonstrates  numerous new metastatic pulmonary nodules. The right lung mass is slightly smaller. The 8 previously measured a maximum of 8 cm and now measures 6.3 cm. No pleural effusion. Stable atherosclerotic calcifications involving the aorta and coronary arteries.  CT ABDOMEN AND PELVIS FINDINGS  New diffuse hepatic  metastatic disease throughout the liver. No biliary dilatation. The gallbladder is normal. No common bile duct dilatation. The pancreas is grossly normal. The splenic lesion has enlarged and new splenic lesions are noted. The adrenal glands and kidneys are unremarkable and stable. Small right adrenal gland adenoma and right renal cysts are noted.  The stomach, duodenum, small bowel and colon are grossly normal. No inflammatory changes, mass lesions or obstructive findings. Stable advanced atherosclerotic calcifications involving the aorta. Small scattered mesenteric and retroperitoneal lymph nodes but no mass or adenopathy.  Moderate air noted in the bladder, may be from recent instrumentation. No pelvic mass or adenopathy. Bony structures are intact. No definite destructive bone lesions  IMPRESSION: CT CHEST IMPRESSION  Slight interval decrease in size of the large right lung mass and slight decrease in size of the mediastinal and hilar lymph nodes. However, there are numerous new pulmonary metastasis.  Stable supraclavicular adenopathy.  CT ABDOMEN AND PELVIS IMPRESSION  New diffuse hepatic metastatic disease, enlarging left splenic lesion and new splenic lesions.   Electronically Signed   By: Loralie Champagne M.D.   On: 02/01/2013 11:17   Mr Laqueta Jean ZO Contrast  01/11/2013   CLINICAL DATA:  Headache and confusion. Personal history of lung cancer.  EXAM: MRI HEAD WITHOUT AND WITH CONTRAST  TECHNIQUE: Multiplanar, multiecho pulse sequences of the brain and surrounding structures were obtained according to standard protocol without and with intravenous contrast  CONTRAST:  10mL MULTIHANCE GADOBENATE DIMEGLUMINE  529 MG/ML IV SOLN  COMPARISON:  CT head without contrast 10/24/2012. MRI brain without and with contrast 11/30/2011.  FINDINGS: Bilateral occipital lobe metastases are noted. The lesion on the left measures 2.3 x 1.6 x 2.2 cm. The lesion on the right measures 1.3 x 1.9 x 1.7 cm. A lesion in the inferior right temporal lobe measures 1.0 x 1.1 x 1.1 cm. A lesion in the anterior right frontal lobe measures 1.0 x 0.8 x 0.7 cm on axial image 57 of series 12. A 4 mm lesion is present just posterior and inferior to this lesion, seen best on image 18 of series 11. The lesion adjacent to the sylvian fissure and along the posterior left frontal lobe measures 0.9 x 0.8 x 0.7 cm. A 2-3 mm metastasis is suspected in the right parietal white matter on image 8 of series 11. A focal lesion is also suspected within the left temporal lobe on image 9 of series 11. No other definite lesions are evident.  The lesions demonstrate restricted diffusion at the periphery of the 2 largest lesions within the left occipital lobe as well as lesions in the left frontal and temporal lobes.  No acute infarct or hemorrhage is present. The ventricles are of normal size. Mild atrophy and white matter disease is evident otherwise. T2 hyperintensity is associated with the largest lesions, compatible with vasogenic edema. Flow is present in the major intracranial arteries. The globes and orbits are intact. Minimal mucosal thickening is present at the floor of the right maxillary sinus. The paranasal sinuses and mastoid air cells are otherwise clear.  IMPRESSION: 1. Multiple bilateral peripherally enhancing lesions throughout the brain as described compatible with metastases. The largest lesions are in the occipital lobes bilaterally. 2. Mild vasogenic edema is associated with the largest lesions. 3. Mild atrophy and white matter disease. This likely reflects the sequela of chronic microvascular ischemia. 4. Minimal right maxillary sinus disease.    Electronically Signed   By: Gennette Pac   On: 01/11/2013 19:34  Microbiology: Recent Results (from the past 240 hour(s))  URINE CULTURE     Status: None   Collection Time    02/01/13 10:25 PM      Result Value Range Status   Specimen Description URINE, CATHETERIZED   Final   Special Requests NONE   Final   Culture  Setup Time     Final   Value: 02/02/2013 05:00     Performed at [facility] Count     Final   Value: >=100,000 COLONIES/ML     Performed at [facility]   Culture     Final   Value: GRAM NEGATIVE RODS     Performed at [facility]   Report Status PENDING   Incomplete     Labs: Results for orders placed during the hospital encounter of 02/01/13  URINE CULTURE      Result Value Range   Specimen Description URINE, CATHETERIZED     Special Requests NONE     Culture  Setup Time       Value: 02/02/2013 05:00     Performed at [facility] Count       Value: >=100,000 COLONIES/ML     Performed at [facility]   Culture       Value: GRAM NEGATIVE RODS     Performed at [facility]   Report Status PENDING    PROTIME-INR      Result Value Range   Prothrombin Time 15.7 (*) 11.6 - 15.2 seconds   INR 1.28  0.00 - 1.49  APTT      Result Value Range   aPTT 28  24 - 37 seconds  URINALYSIS, ROUTINE W REFLEX MICROSCOPIC      Result Value Range   Color, Urine AMBER (*) YELLOW   APPearance CLOUDY (*) CLEAR   Specific Gravity, Urine 1.024  1.005 - 1.030   pH 5.0  5.0 - 8.0   Glucose, UA NEGATIVE  NEGATIVE mg/dL   Hgb urine dipstick NEGATIVE  NEGATIVE   Bilirubin Urine SMALL (*) NEGATIVE   Ketones, ur NEGATIVE  NEGATIVE mg/dL   Protein, ur NEGATIVE  NEGATIVE mg/dL   Urobilinogen, UA 1.0  0.0 - 1.0 mg/dL   Nitrite POSITIVE (*) NEGATIVE   Leukocytes, UA MODERATE (*) NEGATIVE  CBC      Result Value Range   WBC 5.2  4.0 - 10.5 K/uL   RBC 3.17 (*) 3.87 - 5.11 MIL/uL   Hemoglobin 9.8 (*) 12.0 - 15.0  g/dL   HCT 52.8 (*) 41.3 - 24.4 %   MCV 95.3  78.0 - 100.0 fL   MCH 30.9  26.0 - 34.0 pg   MCHC 32.5  30.0 - 36.0 g/dL   RDW 01.0 (*) 27.2 - 53.6 %   Platelets 81 (*) 150 - 400 K/uL  CREATININE, SERUM      Result Value Range   Creatinine, Ser 1.29 (*) 0.50 - 1.10 mg/dL   GFR calc non Af Amer 37 (*) >90 mL/min   GFR calc Af Amer 43 (*) >90 mL/min  COMPREHENSIVE METABOLIC PANEL      Result Value Range   Sodium 135  135 - 145 mEq/L   Potassium 4.2  3.5 - 5.1 mEq/L   Chloride 102  96 - 112 mEq/L   CO2 20  19 - 32 mEq/L   Glucose, Bld 114 (*) 70 - 99 mg/dL   BUN 58 (*) 6 -  23 mg/dL   Creatinine, Ser 4.09 (*) 0.50 - 1.10 mg/dL   Calcium 7.9 (*) 8.4 - 10.5 mg/dL   Total Protein 4.8 (*) 6.0 - 8.3 g/dL   Albumin 1.9 (*) 3.5 - 5.2 g/dL   AST 811 (*) 0 - 37 U/L   ALT 206 (*) 0 - 35 U/L   Alkaline Phosphatase 1102 (*) 39 - 117 U/L   Total Bilirubin 2.8 (*) 0.3 - 1.2 mg/dL   GFR calc non Af Amer 24 (*) >90 mL/min   GFR calc Af Amer 28 (*) >90 mL/min  CBC      Result Value Range   WBC 7.2  4.0 - 10.5 K/uL   RBC 2.73 (*) 3.87 - 5.11 MIL/uL   Hemoglobin 8.5 (*) 12.0 - 15.0 g/dL   HCT 91.4 (*) 78.2 - 95.6 %   MCV 96.0  78.0 - 100.0 fL   MCH 31.1  26.0 - 34.0 pg   MCHC 32.4  30.0 - 36.0 g/dL   RDW 21.3 (*) 08.6 - 57.8 %   Platelets 57 (*) 150 - 400 K/uL  URINE MICROSCOPIC-ADD ON      Result Value Range   WBC, UA 3-6  <3 WBC/hpf   Bacteria, UA MANY (*) RARE   Urine-Other AMORPHOUS URATES/PHOSPHATES    CBC      Result Value Range   WBC 3.1 (*) 4.0 - 10.5 K/uL   RBC 2.71 (*) 3.87 - 5.11 MIL/uL   Hemoglobin 8.5 (*) 12.0 - 15.0 g/dL   HCT 46.9 (*) 62.9 - 52.8 %   MCV 94.5  78.0 - 100.0 fL   MCH 31.4  26.0 - 34.0 pg   MCHC 33.2  30.0 - 36.0 g/dL   RDW 41.3 (*) 24.4 - 01.0 %   Platelets 47 (*) 150 - 400 K/uL  OCCULT BLOOD, POC DEVICE      Result Value Range   Fecal Occult Bld POSITIVE (*) NEGATIVE    Time coordinating discharge: 45 minutes  Signed: [name],  MD 02/03/2013, 8:01 AM

## 2013-02-03 NOTE — Progress Notes (Addendum)
Report called to Dietitian at [facility]. PIV removed, WNL. No change in pt condition since AM assessment. Pt awaiting transportation to [facility]. Eugene Garnet RN  Pt d/c'd to [facility] via [facility]. Eugene Garnet RN

## 2013-02-03 NOTE — Progress Notes (Signed)
PT NOTE  Order received. Noted pt/family desiring comfort care and plan is for d/c to [facility] on 10/18. PT will sign off at this time. [name] [place], [place] [phone]

## 2013-02-06 ENCOUNTER — Other Ambulatory Visit: Payer: Self-pay | Admitting: *Deleted

## 2013-02-18 DEATH — deceased

## 2013-03-07 ENCOUNTER — Ambulatory Visit: Payer: Medicare Other | Admitting: Radiation Oncology

## 2013-08-07 IMAGING — CT CT ABD-PELV W/O CM
2 of 4 series · 16 of 46 positions shown, 18 images · non-contrast
Comparison: 02/12/2012. PET 11/26/2011.

CT CHEST

CLINICAL DATA: Lung cancer.  Chemotherapy in progress.  Cough and
constipation.  Difficulty swallowing.

CT CHEST, ABDOMEN AND PELVIS WITHOUT CONTRAST
TECHNIQUE: Multidetector CT imaging of the chest, abdomen and
pelvis was performed following the standard protocol without IV
contrast.

[Series 2: cap w/o w/o st · axial · non-contrast · 0.77mm/px · z∈[-506,+8]mm · 13 of 115 slices shown, 15 images]
[im 6/115  soft-tissue]
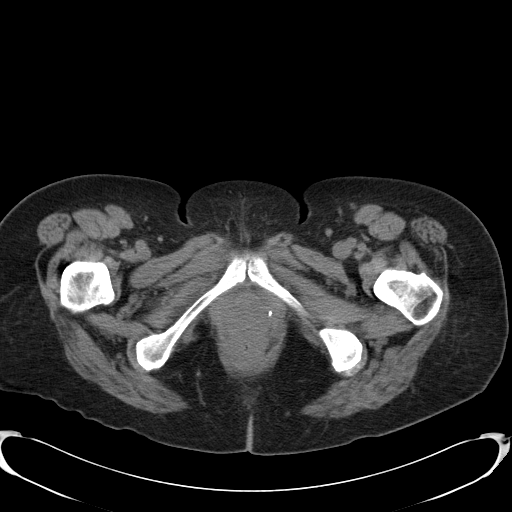
[im 6/115  bone]
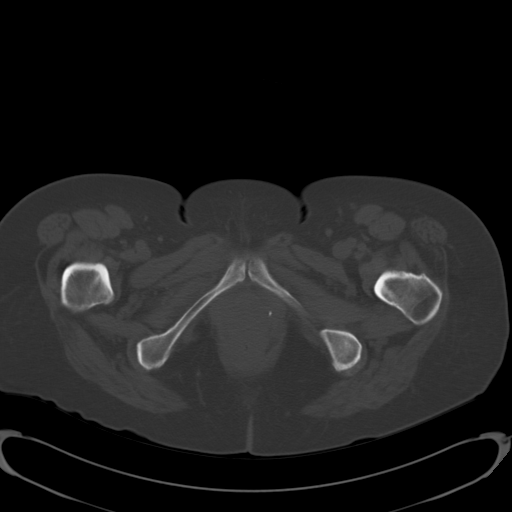
[im 17/115  soft-tissue]
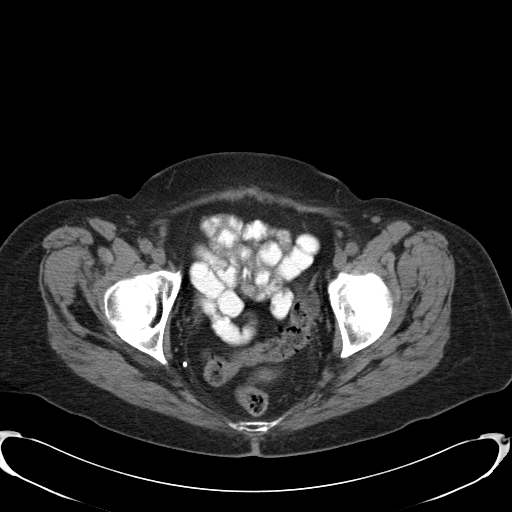
[im 22/115  soft-tissue]
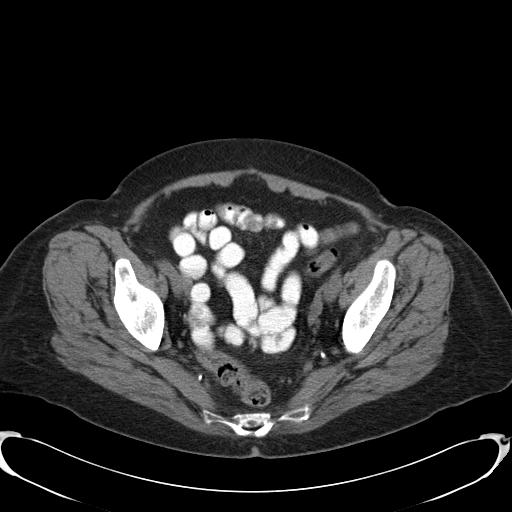
[im 33/115  soft-tissue]
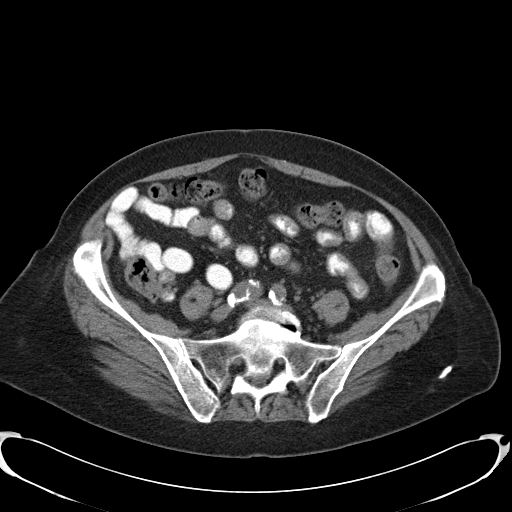
[im 39/115  soft-tissue]
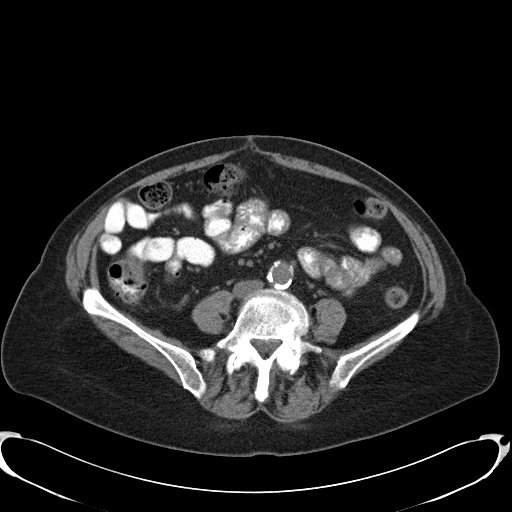
[im 49/115  soft-tissue]
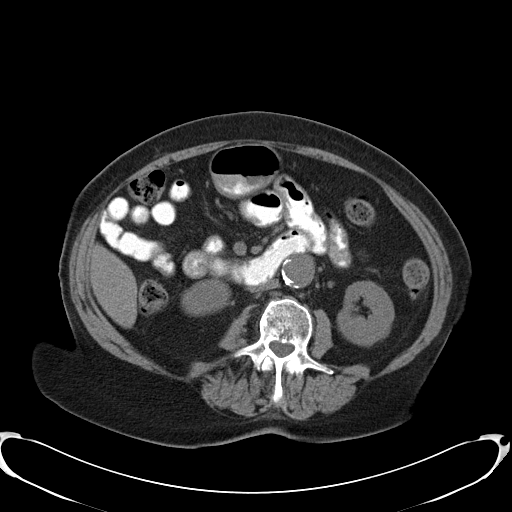
[im 60/115  soft-tissue]
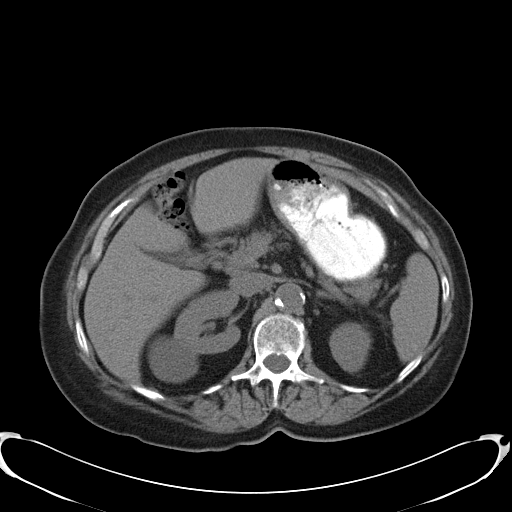
[im 66/115  soft-tissue]
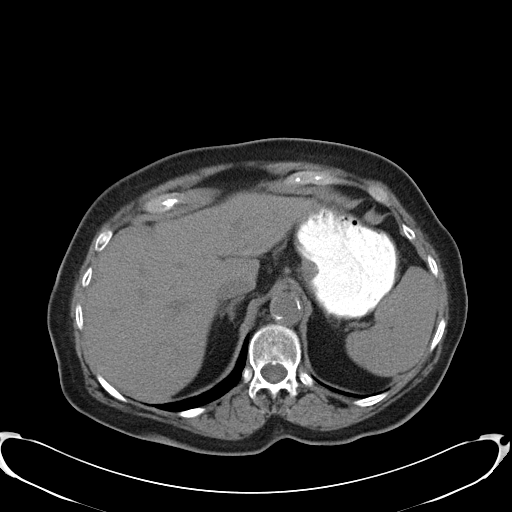
[im 77/115  soft-tissue]
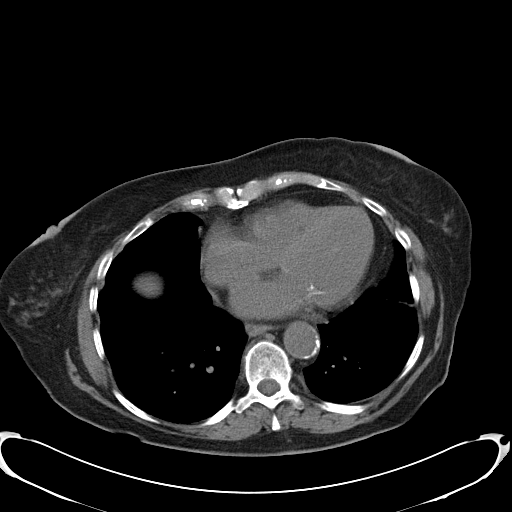
[im 77/115  bone]
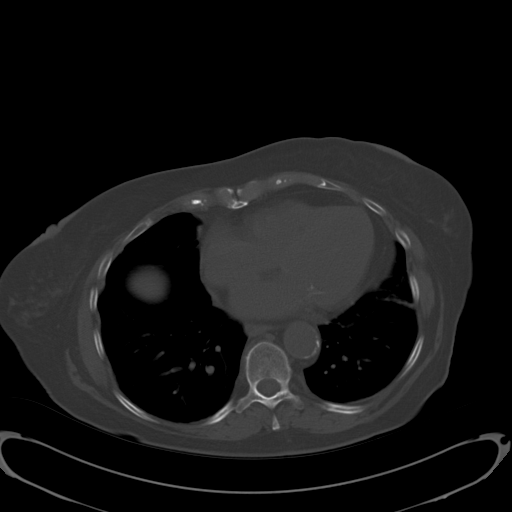
[im 82/115  soft-tissue]
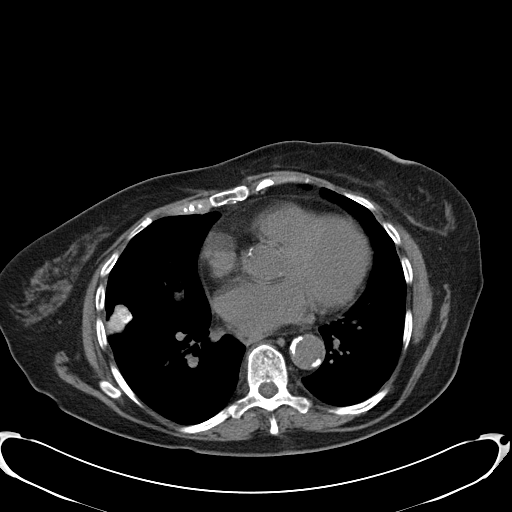
[im 93/115  soft-tissue]
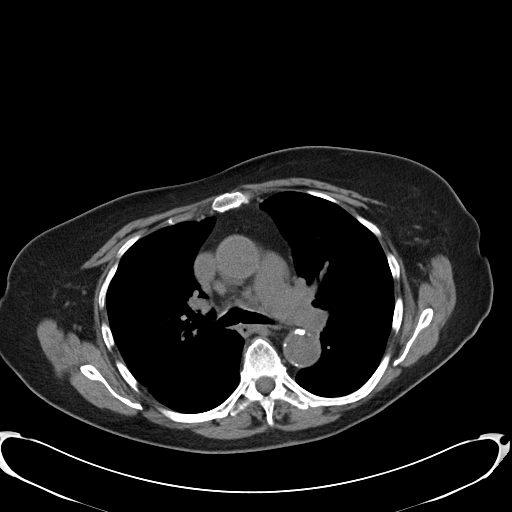
[im 98/115  soft-tissue]
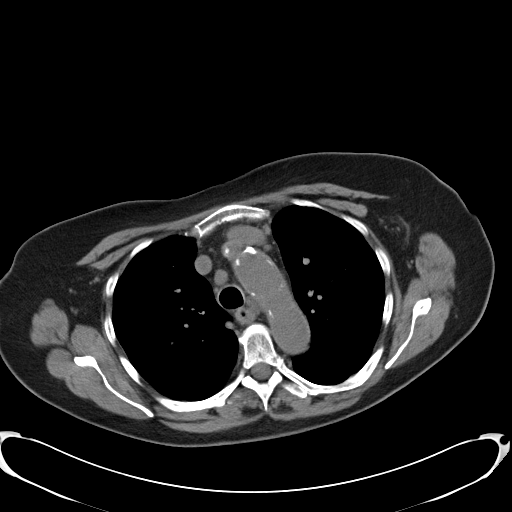
[im 109/115  soft-tissue]
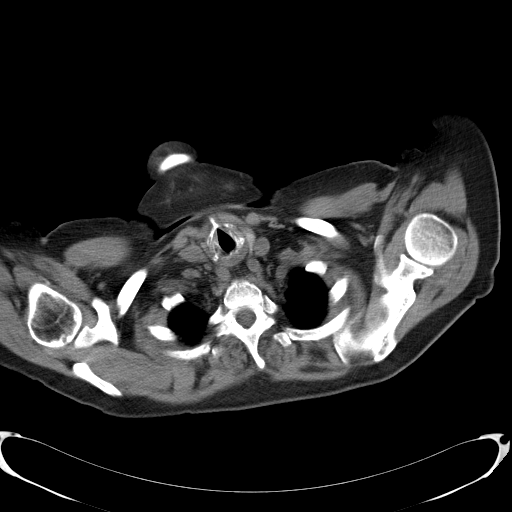

[Series 602: <mpr thick range> · coronal · 1.12mm/px · 3 of 82 slices shown]
[im 28/82  soft-tissue]
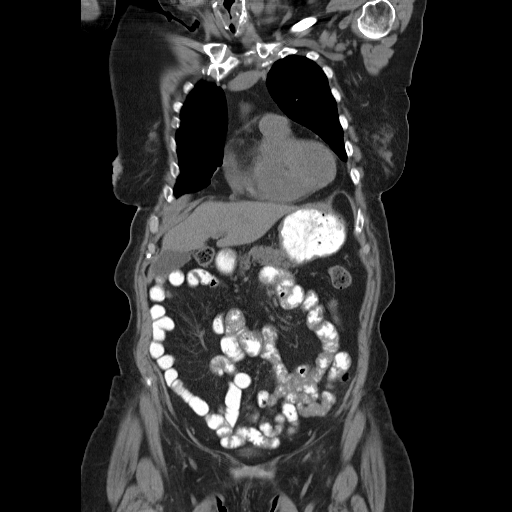
[im 37/82  soft-tissue]
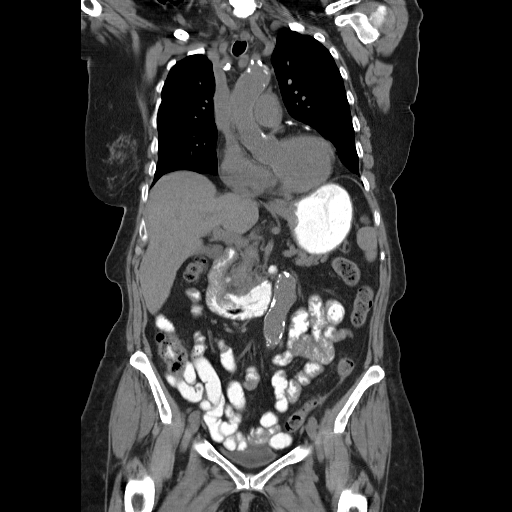
[im 46/82  soft-tissue]
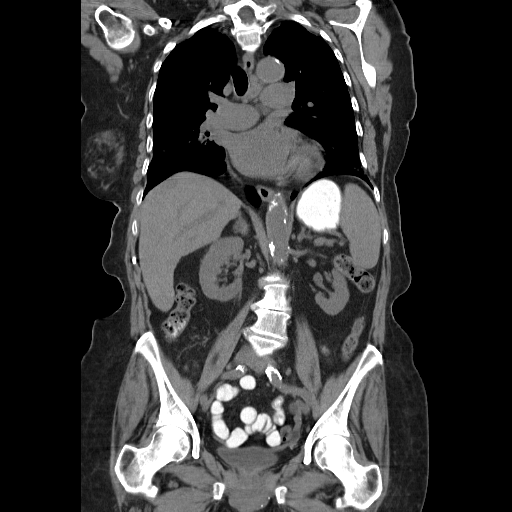

[16 of 46 positions shown; findings below may reference images not displayed]

FINDINGS: Although the largest mediastinal lymph node, in the
lower left paratracheal station, is stable at 12 mm, other
mediastinal lymph nodes have decreased in size.  Index low right
paratracheal lymph node measures 10 mm (previously 13 mm).  Hilar
regions are difficult to definitively evaluate without IV contrast.
No axillary adenopathy.  Heart is mildly enlarged.  No pericardial
effusion.  Coronary artery calcification.

Probable scarring at the apex of the right upper lobe.  Mild
centrilobular emphysema.  Small patchy areas of peribronchovascular
ground-glass in the inferior right upper lobe (example image 23)
are new and therefore likely infectious/inflammatory in etiology.
Lateral segment right middle lobe mass measures 2.1 x 3.3 cm,
grossly stable.  Perihilar right middle lobe nodule appears
slightly larger, measuring 1.8 x 1.7 cm (previously 1.6 x 1.3 cm).
No new nodules.  Linear scarring in the left lower lobe.  No
pleural fluid.  Airway is unremarkable.
IMPRESSION: 1.  Mediastinal adenopathy is stable to minimally smaller in size.
2.  Dominant right middle lobe mass is stable.  Adjacent perihilar
right middle lobe nodule appears slightly larger.

CT ABDOMEN AND PELVIS
FINDINGS: 3 mm low attenuation lesion in the dome of the liver is
unchanged.  Liver, gallbladder and adrenal glands are otherwise
unremarkable.  A 5.5 cm exophytic low attenuation lesion off the
posterior margin of the right kidney measures slightly smaller,
previously 6.2 cm, and is likely a cyst.  Faint calcification in
the upper pole left kidney is unchanged from 11/26/2011.  Spleen,
pancreas, stomach and small bowel are unremarkable.  Stool is seen
in the majority of the colon which is otherwise unremarkable.

Low attenuation lesion in the left adnexa measures 1.8 cm, stable,
and is likely associated with left ovary.  Atherosclerotic
calcification of the arterial vasculature without abdominal aortic
aneurysm.  No pathologically enlarged lymph nodes.  No worrisome
lytic or sclerotic lesions.
IMPRESSION: 1.  No evidence of metastatic disease in the abdomen or pelvis.
2.  Constipation.
3.  Stable small low attenuation lesion in the left adnexa, likely
associated with the left ovary.

## 2013-11-16 ENCOUNTER — Encounter: Payer: Self-pay | Admitting: Gastroenterology

## 2013-12-12 ENCOUNTER — Other Ambulatory Visit: Payer: Self-pay | Admitting: Pharmacist
